# Patient Record
Sex: Female | Born: 1940 | Race: White | Hispanic: No | Smoking: Former smoker
Health system: Southern US, Community
[De-identification: ages and names within clinical notes are randomized; demographics above are authoritative.]

## PROBLEM LIST (undated history)

## (undated) DIAGNOSIS — F419 Anxiety disorder, unspecified: Secondary | ICD-10-CM

## (undated) DIAGNOSIS — K52832 Lymphocytic colitis: Secondary | ICD-10-CM

## (undated) DIAGNOSIS — F329 Major depressive disorder, single episode, unspecified: Secondary | ICD-10-CM

## (undated) DIAGNOSIS — F1010 Alcohol abuse, uncomplicated: Secondary | ICD-10-CM

## (undated) DIAGNOSIS — F32A Depression, unspecified: Secondary | ICD-10-CM

## (undated) DIAGNOSIS — E785 Hyperlipidemia, unspecified: Secondary | ICD-10-CM

## (undated) DIAGNOSIS — B54 Unspecified malaria: Secondary | ICD-10-CM

## (undated) DIAGNOSIS — R413 Other amnesia: Secondary | ICD-10-CM

## (undated) HISTORY — DX: Major depressive disorder, single episode, unspecified: F32.9

## (undated) HISTORY — DX: Lymphocytic colitis: K52.832

## (undated) HISTORY — DX: Depression, unspecified: F32.A

## (undated) HISTORY — DX: Hyperlipidemia, unspecified: E78.5

## (undated) HISTORY — DX: Unspecified malaria: B54

## (undated) HISTORY — DX: Other amnesia: R41.3

## (undated) HISTORY — DX: Alcohol abuse, uncomplicated: F10.10

## (undated) HISTORY — DX: Anxiety disorder, unspecified: F41.9

---

## 1997-12-27 HISTORY — PX: CARDIAC VALVE SURGERY: SHX40

## 2014-12-27 HISTORY — PX: CARDIAC VALVE SURGERY: SHX40

## 2017-03-18 DIAGNOSIS — E7800 Pure hypercholesterolemia, unspecified: Secondary | ICD-10-CM | POA: Diagnosis not present

## 2017-03-18 DIAGNOSIS — J209 Acute bronchitis, unspecified: Secondary | ICD-10-CM | POA: Diagnosis not present

## 2017-05-03 DIAGNOSIS — H1132 Conjunctival hemorrhage, left eye: Secondary | ICD-10-CM | POA: Diagnosis not present

## 2017-05-04 DIAGNOSIS — Z952 Presence of prosthetic heart valve: Secondary | ICD-10-CM | POA: Diagnosis not present

## 2017-05-04 DIAGNOSIS — E7800 Pure hypercholesterolemia, unspecified: Secondary | ICD-10-CM | POA: Diagnosis not present

## 2017-05-04 DIAGNOSIS — I10 Essential (primary) hypertension: Secondary | ICD-10-CM | POA: Diagnosis not present

## 2017-08-01 DIAGNOSIS — M5136 Other intervertebral disc degeneration, lumbar region: Secondary | ICD-10-CM | POA: Diagnosis not present

## 2017-08-01 DIAGNOSIS — M1611 Unilateral primary osteoarthritis, right hip: Secondary | ICD-10-CM | POA: Diagnosis not present

## 2017-08-24 DIAGNOSIS — E7800 Pure hypercholesterolemia, unspecified: Secondary | ICD-10-CM | POA: Diagnosis not present

## 2017-08-24 DIAGNOSIS — Z954 Presence of other heart-valve replacement: Secondary | ICD-10-CM | POA: Diagnosis not present

## 2017-08-24 DIAGNOSIS — Z87442 Personal history of urinary calculi: Secondary | ICD-10-CM | POA: Diagnosis not present

## 2017-08-24 DIAGNOSIS — G629 Polyneuropathy, unspecified: Secondary | ICD-10-CM | POA: Diagnosis not present

## 2017-08-24 DIAGNOSIS — Q253 Supravalvular aortic stenosis: Secondary | ICD-10-CM | POA: Diagnosis not present

## 2017-08-24 DIAGNOSIS — I350 Nonrheumatic aortic (valve) stenosis: Secondary | ICD-10-CM | POA: Diagnosis not present

## 2017-08-24 DIAGNOSIS — Z953 Presence of xenogenic heart valve: Secondary | ICD-10-CM | POA: Diagnosis not present

## 2017-10-10 DIAGNOSIS — M5431 Sciatica, right side: Secondary | ICD-10-CM | POA: Diagnosis not present

## 2017-10-10 DIAGNOSIS — M1611 Unilateral primary osteoarthritis, right hip: Secondary | ICD-10-CM | POA: Diagnosis not present

## 2017-10-11 DIAGNOSIS — Z1231 Encounter for screening mammogram for malignant neoplasm of breast: Secondary | ICD-10-CM | POA: Diagnosis not present

## 2017-11-07 DIAGNOSIS — I10 Essential (primary) hypertension: Secondary | ICD-10-CM | POA: Diagnosis not present

## 2017-11-07 DIAGNOSIS — I350 Nonrheumatic aortic (valve) stenosis: Secondary | ICD-10-CM | POA: Diagnosis not present

## 2017-11-09 DIAGNOSIS — E782 Mixed hyperlipidemia: Secondary | ICD-10-CM | POA: Diagnosis not present

## 2017-11-09 DIAGNOSIS — I10 Essential (primary) hypertension: Secondary | ICD-10-CM | POA: Diagnosis not present

## 2017-11-09 DIAGNOSIS — E7800 Pure hypercholesterolemia, unspecified: Secondary | ICD-10-CM | POA: Diagnosis not present

## 2017-11-09 DIAGNOSIS — Z952 Presence of prosthetic heart valve: Secondary | ICD-10-CM | POA: Diagnosis not present

## 2017-11-09 DIAGNOSIS — M549 Dorsalgia, unspecified: Secondary | ICD-10-CM | POA: Diagnosis not present

## 2017-11-21 DIAGNOSIS — M5431 Sciatica, right side: Secondary | ICD-10-CM | POA: Diagnosis not present

## 2017-11-21 DIAGNOSIS — M1611 Unilateral primary osteoarthritis, right hip: Secondary | ICD-10-CM | POA: Diagnosis not present

## 2017-11-22 DIAGNOSIS — L821 Other seborrheic keratosis: Secondary | ICD-10-CM | POA: Diagnosis not present

## 2017-11-22 DIAGNOSIS — Z08 Encounter for follow-up examination after completed treatment for malignant neoplasm: Secondary | ICD-10-CM | POA: Diagnosis not present

## 2017-11-22 DIAGNOSIS — L570 Actinic keratosis: Secondary | ICD-10-CM | POA: Diagnosis not present

## 2017-11-22 DIAGNOSIS — X32XXXA Exposure to sunlight, initial encounter: Secondary | ICD-10-CM | POA: Diagnosis not present

## 2017-11-22 DIAGNOSIS — D225 Melanocytic nevi of trunk: Secondary | ICD-10-CM | POA: Diagnosis not present

## 2017-11-22 DIAGNOSIS — D2261 Melanocytic nevi of right upper limb, including shoulder: Secondary | ICD-10-CM | POA: Diagnosis not present

## 2017-11-22 DIAGNOSIS — D2272 Melanocytic nevi of left lower limb, including hip: Secondary | ICD-10-CM | POA: Diagnosis not present

## 2017-11-22 DIAGNOSIS — Z85828 Personal history of other malignant neoplasm of skin: Secondary | ICD-10-CM | POA: Diagnosis not present

## 2017-11-22 DIAGNOSIS — D2262 Melanocytic nevi of left upper limb, including shoulder: Secondary | ICD-10-CM | POA: Diagnosis not present

## 2017-11-22 DIAGNOSIS — D2271 Melanocytic nevi of right lower limb, including hip: Secondary | ICD-10-CM | POA: Diagnosis not present

## 2017-11-28 DIAGNOSIS — W57XXXA Bitten or stung by nonvenomous insect and other nonvenomous arthropods, initial encounter: Secondary | ICD-10-CM | POA: Diagnosis not present

## 2017-11-28 DIAGNOSIS — R21 Rash and other nonspecific skin eruption: Secondary | ICD-10-CM | POA: Diagnosis not present

## 2017-12-01 DIAGNOSIS — S70361A Insect bite (nonvenomous), right thigh, initial encounter: Secondary | ICD-10-CM | POA: Diagnosis not present

## 2017-12-01 DIAGNOSIS — S40862A Insect bite (nonvenomous) of left upper arm, initial encounter: Secondary | ICD-10-CM | POA: Diagnosis not present

## 2018-02-28 DIAGNOSIS — Z23 Encounter for immunization: Secondary | ICD-10-CM | POA: Diagnosis not present

## 2018-04-26 DIAGNOSIS — H18831 Recurrent erosion of cornea, right eye: Secondary | ICD-10-CM | POA: Diagnosis not present

## 2018-05-16 DIAGNOSIS — Z952 Presence of prosthetic heart valve: Secondary | ICD-10-CM | POA: Diagnosis not present

## 2018-05-16 DIAGNOSIS — G459 Transient cerebral ischemic attack, unspecified: Secondary | ICD-10-CM | POA: Diagnosis not present

## 2018-05-16 DIAGNOSIS — E782 Mixed hyperlipidemia: Secondary | ICD-10-CM | POA: Diagnosis not present

## 2018-05-16 DIAGNOSIS — I10 Essential (primary) hypertension: Secondary | ICD-10-CM | POA: Diagnosis not present

## 2018-06-28 DIAGNOSIS — L570 Actinic keratosis: Secondary | ICD-10-CM | POA: Diagnosis not present

## 2018-06-28 DIAGNOSIS — L821 Other seborrheic keratosis: Secondary | ICD-10-CM | POA: Diagnosis not present

## 2018-06-28 DIAGNOSIS — L814 Other melanin hyperpigmentation: Secondary | ICD-10-CM | POA: Diagnosis not present

## 2018-06-28 DIAGNOSIS — D229 Melanocytic nevi, unspecified: Secondary | ICD-10-CM | POA: Diagnosis not present

## 2018-06-28 DIAGNOSIS — X32XXXA Exposure to sunlight, initial encounter: Secondary | ICD-10-CM | POA: Diagnosis not present

## 2018-07-03 DIAGNOSIS — I340 Nonrheumatic mitral (valve) insufficiency: Secondary | ICD-10-CM | POA: Diagnosis not present

## 2018-07-03 DIAGNOSIS — I348 Other nonrheumatic mitral valve disorders: Secondary | ICD-10-CM | POA: Diagnosis not present

## 2018-07-03 DIAGNOSIS — I350 Nonrheumatic aortic (valve) stenosis: Secondary | ICD-10-CM | POA: Diagnosis not present

## 2018-07-03 DIAGNOSIS — I361 Nonrheumatic tricuspid (valve) insufficiency: Secondary | ICD-10-CM | POA: Diagnosis not present

## 2018-07-11 DIAGNOSIS — R410 Disorientation, unspecified: Secondary | ICD-10-CM | POA: Diagnosis not present

## 2018-07-11 DIAGNOSIS — F419 Anxiety disorder, unspecified: Secondary | ICD-10-CM | POA: Diagnosis not present

## 2018-07-11 DIAGNOSIS — R001 Bradycardia, unspecified: Secondary | ICD-10-CM | POA: Diagnosis not present

## 2018-08-09 DIAGNOSIS — Z1211 Encounter for screening for malignant neoplasm of colon: Secondary | ICD-10-CM | POA: Diagnosis not present

## 2018-08-09 DIAGNOSIS — Z1212 Encounter for screening for malignant neoplasm of rectum: Secondary | ICD-10-CM | POA: Diagnosis not present

## 2018-08-23 DIAGNOSIS — Z952 Presence of prosthetic heart valve: Secondary | ICD-10-CM | POA: Diagnosis not present

## 2018-08-23 DIAGNOSIS — I10 Essential (primary) hypertension: Secondary | ICD-10-CM | POA: Diagnosis not present

## 2018-08-23 DIAGNOSIS — E782 Mixed hyperlipidemia: Secondary | ICD-10-CM | POA: Diagnosis not present

## 2018-09-14 DIAGNOSIS — H26493 Other secondary cataract, bilateral: Secondary | ICD-10-CM | POA: Diagnosis not present

## 2018-09-14 DIAGNOSIS — H35371 Puckering of macula, right eye: Secondary | ICD-10-CM | POA: Diagnosis not present

## 2018-10-10 DIAGNOSIS — Z23 Encounter for immunization: Secondary | ICD-10-CM | POA: Diagnosis not present

## 2018-12-18 ENCOUNTER — Other Ambulatory Visit: Payer: Self-pay

## 2018-12-18 ENCOUNTER — Ambulatory Visit (INDEPENDENT_AMBULATORY_CARE_PROVIDER_SITE_OTHER): Payer: Medicare Other | Admitting: Family Medicine

## 2018-12-18 ENCOUNTER — Encounter: Payer: Self-pay | Admitting: Family Medicine

## 2018-12-18 VITALS — BP 134/82 | HR 74 | Temp 97.7°F | Resp 18 | Ht 64.17 in | Wt 129.2 lb

## 2018-12-18 DIAGNOSIS — F329 Major depressive disorder, single episode, unspecified: Secondary | ICD-10-CM

## 2018-12-18 DIAGNOSIS — R233 Spontaneous ecchymoses: Secondary | ICD-10-CM

## 2018-12-18 DIAGNOSIS — F411 Generalized anxiety disorder: Secondary | ICD-10-CM

## 2018-12-18 LAB — POCT CBC
GRANULOCYTE PERCENT: 83.3 % — AB (ref 37–80)
HCT, POC: 37.9 % (ref 29–41)
Hemoglobin: 12.8 g/dL (ref 11–14.6)
Lymph, poc: 1.2 (ref 0.6–3.4)
MCH, POC: 30.4 pg (ref 27–31.2)
MCHC: 33.9 g/dL (ref 31.8–35.4)
MCV: 89.4 fL (ref 76–111)
MID (cbc): 0.5 (ref 0–0.9)
MPV: 7.6 fL (ref 0–99.8)
POC Granulocyte: 8.2 — AB (ref 2–6.9)
POC LYMPH PERCENT: 11.9 %L (ref 10–50)
POC MID %: 4.8 % (ref 0–12)
Platelet Count, POC: 180 10*3/uL (ref 142–424)
RBC: 4.23 M/uL (ref 4.04–5.48)
RDW, POC: 14.7 %
WBC: 9.8 10*3/uL (ref 4.6–10.2)

## 2018-12-18 MED ORDER — SERTRALINE HCL 25 MG PO TABS: 25.0000 mg | ORAL_TABLET | Freq: Every day | ORAL | 2 refills | Status: DC

## 2018-12-18 NOTE — Progress Notes (Signed)
Patient ID: Connie Alvarado, female    DOB: [birth year]  Age: 77 y.o. MRN: [id number]  Chief Complaint  Patient presents with  . Rash    X 2 mth- left leg  . Anxiety    screening done  . Depression    screening done    Subjective:   77 year old lady who lives in [place] who is here at her daughter's for an extended visit.  She came a couple months ago and plans to be here couple of more months.  Her husband is hospitalized with kidney cancer problems.  She has been under good deal of stress with that.  She has a rash on her left ankle that she is concerned about.  She had shown it to a doctor in the hospital who said she should come see a doctor.  She has a cat, but does not think she was ever scratched.  She does have some anxiety issues with her husband being ill which is gradually gotten worse.  She also has been depressed.  Denies suicidal ideation.  Has not been on any antidepressant medication, but she has some Ativan which she takes on a as needed basis from her doctor in [place].  Current allergies, medications, problem list, past/family and social histories reviewed.  She has a history of having had aortic valve replaced in 1999 and again in 2016.  She does not smoke.  She drinks a 2 or 3 glasses of wine every day.  She has Lipitor, aspirin, and Ativan listed as a medicines.  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  Objective:  BP 134/82   Pulse 74   Temp 97.7 F (36.5 C) (Oral)   Resp 18   Ht 5' 4.17" (1.63 m)   Wt 129 lb 3.2 oz (58.6 kg)   SpO2 98%   BMI 22.06 kg/m   Pleasant lady, alert.  Mildly confused seeming at times, does not remember some stuff that is just been sad but she is quite anxious.  Her daughter accompanying her understands things.  On the ankle there is an area of 3 linear rashes which are parallel about a centimeter apart.  There are little punctate erythematous areas along each linear  streak and some larger ecchymotic areas up to 3 or 4 mm in diameter.  Looks almost petechial.  None described elsewhere.  Assessment & Plan:   Assessment: 1. Petechial rash   2. Reactive depression   3. Generalized anxiety disorder       Plan: Check a CBC.  She has had a flu shot but urged her to get a Pneumovax sometime.  She needs to check with the doctor in [place] to find out what she has had.  We will start her on an SSRI.  Orders Placed This Encounter  Procedures  . POCT CBC    No orders of the defined types were placed in this encounter.   Results for orders placed or performed in visit on 12/18/18  POCT CBC  Result Value Ref Range   WBC 9.8 4.6 - 10.2 K/uL   Lymph, poc 1.2 0.6 - 3.4   POC LYMPH PERCENT 11.9 10 - 50 %L   MID (cbc) 0.5 0 - 0.9   POC MID % 4.8 0 - 12 %M   POC Granulocyte 8.2 (A) 2 - 6.9   Granulocyte percent 83.3 (A) 37 - 80 %G   RBC 4.23 4.04 - 5.48  M/uL   Hemoglobin 12.8 11 - 14.6 g/dL   HCT, POC 37.9 29 - 41 %   MCV 89.4 76 - 111 fL   MCH, POC 30.4 27 - 31.2 pg   MCHC 33.9 31.8 - 35.4 g/dL   RDW, POC 14.7 %   Platelet Count, POC 180 142 - 424 K/uL   MPV 7.6 0 - 99.8 fL        Patient Instructions  Check to see which pneumonia vaccination you have had, Pneumovax 23 or Prevnar.  You ultimately need to have both.  If you have not had the Pneumovax 23 for over 10 years you should get it repeated.  Minimize use of the Ativan.  (Lorazepam)  Try to decrease the regular wine intake.  Take pictures to monitor the skin lesions as noted.  I do not believe these are of any great concern and I expect them to resolve eventually on their own.  If they are getting worse please return, or if no change in about 3 months get them rechecked.  Begin Zoloft (sertraline) 25 mg 1 daily each morning.  This is good for depression and anxiety both, but takes being on it for about 2 to 3 weeks before you note whether it is helping.  If you are not  improving over the next month get rechecked as your dose may need to be increased.  Return as needed, or establish yourself or primary care here if staying in the area longer.        No follow-ups on file.   Ruben Reason, MD 12/18/2018

## 2018-12-18 NOTE — Patient Instructions (Addendum)
Check to see which pneumonia vaccination you have had, Pneumovax 23 or Prevnar.  You ultimately need to have both.  If you have not had the Pneumovax 23 for over 10 years you should get it repeated.  Minimize use of the Ativan.  (Lorazepam)  Try to decrease the regular wine intake.  Take pictures to monitor the skin lesions as noted.  I do not believe these are of any great concern and I expect them to resolve eventually on their own.  If they are getting worse please return, or if no change in about 3 months get them rechecked.  Begin Zoloft (sertraline) 25 mg 1 daily each morning.  This is good for depression and anxiety both, but takes being on it for about 2 to 3 weeks before you note whether it is helping.  If you are not improving over the next month get rechecked as your dose may need to be increased.  Return as needed, or establish yourself or primary care here if staying in the area longer.

## 2019-03-07 ENCOUNTER — Emergency Department (HOSPITAL_COMMUNITY): Payer: Medicare Other

## 2019-03-07 ENCOUNTER — Encounter (HOSPITAL_COMMUNITY): Payer: Self-pay | Admitting: Emergency Medicine

## 2019-03-07 ENCOUNTER — Emergency Department (HOSPITAL_COMMUNITY)
Admission: EM | Admit: 2019-03-07 | Discharge: 2019-03-07 | Disposition: A | Discharge status: Home / Self Care | Payer: Medicare Other | Attending: Emergency Medicine | Admitting: Emergency Medicine

## 2019-03-07 ENCOUNTER — Other Ambulatory Visit: Payer: Self-pay

## 2019-03-07 DIAGNOSIS — Y998 Other external cause status: Secondary | ICD-10-CM | POA: Diagnosis not present

## 2019-03-07 DIAGNOSIS — S299XXA Unspecified injury of thorax, initial encounter: Secondary | ICD-10-CM | POA: Diagnosis not present

## 2019-03-07 DIAGNOSIS — Z79899 Other long term (current) drug therapy: Secondary | ICD-10-CM | POA: Diagnosis not present

## 2019-03-07 DIAGNOSIS — S42292A Other displaced fracture of upper end of left humerus, initial encounter for closed fracture: Secondary | ICD-10-CM | POA: Diagnosis not present

## 2019-03-07 DIAGNOSIS — S3993XA Unspecified injury of pelvis, initial encounter: Secondary | ICD-10-CM | POA: Diagnosis not present

## 2019-03-07 DIAGNOSIS — W19XXXA Unspecified fall, initial encounter: Secondary | ICD-10-CM | POA: Diagnosis not present

## 2019-03-07 DIAGNOSIS — Z87891 Personal history of nicotine dependence: Secondary | ICD-10-CM | POA: Insufficient documentation

## 2019-03-07 DIAGNOSIS — S42212A Unspecified displaced fracture of surgical neck of left humerus, initial encounter for closed fracture: Secondary | ICD-10-CM

## 2019-03-07 DIAGNOSIS — S4992XA Unspecified injury of left shoulder and upper arm, initial encounter: Secondary | ICD-10-CM | POA: Diagnosis present

## 2019-03-07 DIAGNOSIS — Y929 Unspecified place or not applicable: Secondary | ICD-10-CM | POA: Insufficient documentation

## 2019-03-07 DIAGNOSIS — F10920 Alcohol use, unspecified with intoxication, uncomplicated: Secondary | ICD-10-CM | POA: Diagnosis not present

## 2019-03-07 DIAGNOSIS — Y9389 Activity, other specified: Secondary | ICD-10-CM | POA: Diagnosis not present

## 2019-03-07 LAB — ETHANOL: Alcohol, Ethyl (B): 175 mg/dL — ABNORMAL HIGH (ref <10)

## 2019-03-07 MED ORDER — HYDROCODONE-ACETAMINOPHEN 5-325 MG PO TABS: 1.0000 | ORAL_TABLET | ORAL | 0 refills | Status: DC | PRN

## 2019-03-07 MED ORDER — HYDROCODONE-ACETAMINOPHEN 5-325 MG PO TABS
1.0000 | ORAL_TABLET | Freq: Once | ORAL | Status: AC
  Administered 2019-03-07: 1 via ORAL
  Filled 2019-03-07: qty 1

## 2019-03-07 NOTE — Progress Notes (Signed)
NEW PATIENT  Assessment and Plan:  Medication management -     CBC with Differential/Platelet -     COMPLETE METABOLIC PANEL WITH GFR -     Magnesium  Alcohol abuse with alcohol-induced disorder (HCC) -     CT Head Wo Contrast; Future - ATIVAN GIVEN TO DAUGHTER, no history of DT's, can given 1/2 ativan or 0.25mg   2-3 x a day AS needed to patient for tremors, anxiety - cautions with ativan use given and discussion of alcohol withdrawal and DTS with daughter and paitent -With recent fall will get CT screening to rule out hemorrhage, especially with alcohol history  Double vision -     CT Head Wo Contrast; Future Follow up eye doctor, last appointment 1 year ago in [place] With recent fall will get CT screening to rule out hemorrhage, may need MRI/MRA  Fall, initial encounter -     CT Head Wo Contrast; Future -     Ambulatory referral to Orthopedics - in a sling, needs follow up at ortho, will send that referral in and daughter encouraged to call. The patient and daughter wish to do tylenol or ibuprofen for pain control rather than a narcotic. Due to her neurological complains and alcohol which thins the blood, I will obtain a CT head to rule out a hemorrhage prior to NSAID use. - patient will need PT after healing - given RX for toilet riser with handles  - has shower chair, declines cane at this time- will evaluate at PT in the future hopefully  Mixed hyperlipidemia check lipids decrease fatty foods increase activity.  -     TSH -     Lipid panel  H/O prosthetic aortic valve replacement -     Ambulatory referral to Cardiology - suggest echo at this time  B12 deficiency -     Vitamin B12 -     Vitamin B1 - with long term alcohol use and neuro complaints will get labs to rule out defciency  Anemia, unspecified type -     Iron,Total/Total Iron Binding Cap -     Folate RBC  Vitamin D deficiency -     VITAMIN D 25 Hydroxy (Vit-D Deficiency, Fractures)  Urinary  frequency -     Urinalysis, Routine w reflex microscopic -     Urine Culture - rule out infection  Grief -     sertraline (ZOLOFT) 50 MG tablet; Take 1 tablet (50 mg total) by mouth daily. - suggest hospice/counseling.    CLOSE FOLLOW UP 1-2 WEEKS Discussed med's effects and SE's. Screening labs and tests as requested with regular follow-up as recommended. Over 40 minutes of exam, counseling, chart review, and complex, high level critical decision making was performed this visit.   HPI  78 y.o. female  presents for evaluation as new patient and follow up for has Alcohol abuse with alcohol-induced disorder ([place]); Medication management; Mixed hyperlipidemia; and H/O prosthetic aortic valve replacement on their problem list..  Her blood pressure has been controlled at home, today their BP is BP: 124/80 She does not workout. She denies chest pain, shortness of breath, dizziness.   She has history of anxiety, her husband has kidney cancer, passed very recently. Moved in with her daughter from [place].   She was given zoloft in Dec from Dr. Linna Darner, she drinks wine nightly, she is on ativan from [place] doctor.   She had a fall yesterday, daughter states she did not eat, she was drinking vodka and taking  ativan, had unwitnessed fall, uncertain if she passed out, she does not remember fall. She did not lose bladder control, no known CP, SOB, dizziness prior to fall. Daughter heard her calling, she was unable to get up. Went to ER, had humerus fracture, in a sling, needs follow up at ortho, will send that referral in and daughter encouraged to call.  Alcohol was 175 at that time.   She also had a fall march 6th trying to get up from a toliet.  Daughter states her mom has had double vision, her memory has been worse. She has a history of daily wine use for a number of years. Has not had seizures or DTs in the past. Has not had a drink x yesterday. She has not had her ativan as well and her daughter  did not pick up the vicodin RX due to fear of mixing the two. The patient and daughter wish to do tylenol or ibuprofen for pain control rather than a narcotic. Due to her neurological complains and alcohol which thins the blood, I will obtain a CT head to rule out a hemorrhage prior to NSAID use. I will also obtain labs to look for deficiency, she has not been eating or drinking well due to alcohol consumption and recent husbands passing with many life changes such as moving here. Suggest hospice counseling, number will be referred. Patient complains of general weakness, no CP, SOB, diarrhea, constipation, or focal weakness.   She has a history of hyperlipidemia on liptior and has history of bovine aortic valve replacement x 2, last one was 2016, she is due for an echo and needs referral to a local cardiologist for follow up. In light of her recent fall and syncope, likely substance abuse, will refer but will likely need echo sooner rather than later to rule out other causes.    Current Medications:  Current Outpatient Medications on File Prior to Visit  Medication Sig Dispense Refill  . atorvastatin (LIPITOR) 80 MG tablet Take 80 mg by mouth daily.    Marland Kitchen HYDROcodone-acetaminophen (NORCO/VICODIN) 5-325 MG tablet Take 1 tablet by mouth every 4 (four) hours as needed. 10 tablet 0  . LORazepam (ATIVAN) 0.5 MG tablet Take 0.5-1 mg by mouth See admin instructions. Every 4 to 6 hours as needed for anxiety. May repeat in 30 minutes if needed.    Marland Kitchen VITAMIN D PO Take 1 tablet by mouth daily.     No current facility-administered medications on file prior to visit.    Allergies:  No Known Allergies Medical History:  She has Alcohol abuse with alcohol-induced disorder ([place]); Medication management; Mixed hyperlipidemia; and H/O prosthetic aortic valve replacement on their problem list.  Will request complete records from previous doctors  Patient Care Team: Unk Pinto, MD as PCP - General (Internal  Medicine)  Surgical History:  She has a past surgical history that includes Cardiac valuve replacement (1999) and Cardiac valuve replacement (2016). Family History:  Herfamily history includes Cancer in her mother; Hyperlipidemia in her mother; Hypertension in her mother; Kidney disease in her father; Parkinson's disease in her father. Social History:  She reports that she quit smoking about 40 years ago. Her smoking use included cigarettes. She has never used smokeless tobacco. She reports current alcohol use of about 19.0 standard drinks of alcohol per week. She reports that she does not use drugs.  Review of Systems: ROS See HPI  Physical Exam: Estimated body mass index is 21.18 kg/m as calculated from the  following:   Height as of this encounter: 5\' 4"  (1.626 m).   Weight as of this encounter: 123 lb 6.4 oz (56 kg). BP 124/80   Pulse 81   Resp 14   Ht 5\' 4"  (1.626 m)   Wt 123 lb 6.4 oz (56 kg)   SpO2 96%   BMI 21.18 kg/m  General Appearance: elderly frail appearing female in no apparent distress but some level of discomfort from her right arm.  Eyes: PERRLA, EOMs, conjunctiva no swelling or erythema Sinuses: No Frontal/maxillary tenderness, yellow healing ecchymosis along frontal scalp at hair line, nontender.   ENT/Mouth: Ext aud canals clear, normal light reflex with TMs without erythema, bulging.  No erythema, swelling, or exudate on post pharynx. Tonsils not swollen or erythematous. Hearing good Neck: Supple, thyroid normal. No bruits  Respiratory: Respiratory effort normal, BS equal bilaterally without rales, rhonchi, wheezing or stridor.  Cardio: RRR without murmurs, rubs or gallops.  Chest: symmetric, with normal excursions and percussion.  Abdomen: Soft, nontender, no guarding, rebound, hernias, masses, or organomegaly.  Lymphatics: Non tender without lymphadenopathy.  Musculoskeletal: Full ROM all peripheral extremities BUT left arm, left arm in a sling with pain with  any movement, ecchymosis and swelling at right shoulder, no swelling distal right hand, good sensation and pulses right hand, pain in right arm with right wrist extension, good grip strength with some pain, antalgic, unsteady gait.  Skin: Warm, dry without rashes, lesions, ecchymosis. Neuro: Cranial nerves intact, some nystagmus horizontal,  Normal muscle tone, wide based stance,  Sensation intact.  Psych: Awake and oriented X 3, normal affect, Insight and Judgment appropriate, depressed mood.    Vicie Mutters 11:49 AM [facility]

## 2019-03-07 NOTE — ED Provider Notes (Signed)
[facility] DEPT Provider Note   CSN: [id number] Arrival date & time: 03/07/19  2152    History   Chief Complaint Chief Complaint  Patient presents with  . Fall  . Arm Injury  . Alcohol Intoxication    HPI Connie Alvarado is a 78 y.o. female.     Pt presents to the ED today with left arm pain.  Pt has been drinking tonight and fell, hitting her left arm.  She did fall and hit her head a few days ago, but not tonight.  She does not take blood thinners.  She does drink heavily, and has been drinking more since her husband passed away recently.     Past Medical History:  Diagnosis Date  . Anxiety   . Depression     There are no active problems to display for this patient.   Past Surgical History:  Procedure Laterality Date  . [place]  . CARDIAC VALVE SURGERY  2016     OB History   No obstetric history on file.      Home Medications    Prior to Admission medications   Medication Sig Start Date End Date Taking? Authorizing Provider  atorvastatin (LIPITOR) 80 MG tablet Take 80 mg by mouth daily.   Yes [provider]  LORazepam (ATIVAN) 0.5 MG tablet Take 0.5-1 mg by mouth See admin instructions. Every 4 to 6 hours as needed for anxiety. May repeat in 30 minutes if needed.   Yes [provider]  sertraline (ZOLOFT) 25 MG tablet Take 1 tablet (25 mg total) by mouth daily. 12/18/18  Yes Posey Boyer, MD  VITAMIN D PO Take 1 tablet by mouth daily.   Yes [provider]  HYDROcodone-acetaminophen (NORCO/VICODIN) 5-325 MG tablet Take 1 tablet by mouth every 4 (four) hours as needed. 03/07/19   Isla Pence, MD    Family History Family History  Problem Relation Age of Onset  . Cancer Mother   . Hypertension Mother   . Hyperlipidemia Mother   . Parkinson's disease Father   . Kidney disease Father     Social History Social History   Tobacco Use  . Smoking status: Former Smoker   Types: Cigarettes    Last attempt to quit: 1980    Years since quitting: 40.2  . Smokeless tobacco: Never Used  Substance Use Topics  . Alcohol use: Yes    Alcohol/week: 19.0 standard drinks    Types: 19 Glasses of wine per week  . Drug use: Never     Allergies   Patient has no known allergies.   Review of Systems Review of Systems  Musculoskeletal:       Left arm pain  All other systems reviewed and are negative.    Physical Exam Updated Vital Signs BP 130/74 (BP Location: Right Arm)   Pulse 71   Temp 98.4 F (36.9 C) (Oral)   Resp (!) 22   SpO2 97%   Physical Exam Vitals signs and nursing note reviewed.  Constitutional:      Appearance: Normal appearance.  HENT:     Head: Normocephalic and atraumatic.     Right Ear: External ear normal.     Left Ear: External ear normal.     Nose: Nose normal.     Mouth/Throat:     Mouth: Mucous membranes are moist.  Eyes:     Extraocular Movements: Extraocular movements intact.     Pupils: Pupils are equal,  round, and reactive to light.  Neck:     Musculoskeletal: Normal range of motion and neck supple.  Cardiovascular:     Rate and Rhythm: Normal rate and regular rhythm.     Pulses: Normal pulses.     Heart sounds: Normal heart sounds.  Pulmonary:     Effort: Pulmonary effort is normal.     Breath sounds: Normal breath sounds.  Abdominal:     General: Abdomen is flat.     Palpations: Abdomen is soft.  Musculoskeletal:     Left shoulder: She exhibits decreased range of motion and tenderness.  Skin:    General: Skin is warm.     Capillary Refill: Capillary refill takes less than 2 seconds.  Neurological:     General: No focal deficit present.     Mental Status: She is alert and oriented to person, place, and time.  Psychiatric:        Mood and Affect: Mood normal.        Behavior: Behavior normal.      ED Treatments / Results  Labs (all labs ordered are listed, but only abnormal results are displayed)  Labs Reviewed  ETHANOL    EKG None  Radiology Dg Chest 2 View  Result Date: 03/07/2019 CLINICAL DATA:  Fall EXAM: CHEST - 2 VIEW COMPARISON:  None. FINDINGS: The heart size and mediastinal contours are within normal limits. Both lungs are clear. The visualized skeletal structures are unremarkable. Remote median sternotomy and valvuloplasty. IMPRESSION: No active cardiopulmonary disease. Electronically Signed   By: Ulyses Jarred M.D.   On: 03/07/2019 22:45   Dg Pelvis 1-2 Views  Result Date: 03/07/2019 CLINICAL DATA:  Fall EXAM: PELVIS - 1-2 VIEW COMPARISON:  None. FINDINGS: There is no evidence of pelvic fracture or diastasis. Mild bilateral hip osteoarthrosis, right worse than left. No pelvic bone lesions are seen. IMPRESSION: Negative. Electronically Signed   By: Ulyses Jarred M.D.   On: 03/07/2019 22:44   Dg Shoulder Left  Result Date: 03/07/2019 CLINICAL DATA:  Fall EXAM: LEFT SHOULDER - 2+ VIEW COMPARISON:  None. FINDINGS: Comminuted fracture of the proximal left humerus with minimal displacement of any fracture fragment. There is no glenohumeral dislocation. Left clavicle is intact. IMPRESSION: Comminuted fracture of the proximal left humerus. Electronically Signed   By: Ulyses Jarred M.D.   On: 03/07/2019 22:43   Dg Humerus Left  Result Date: 03/07/2019 CLINICAL DATA:  Fall EXAM: LEFT HUMERUS - 2+ VIEW COMPARISON:  None. FINDINGS: Comminuted fracture of the left humeral neck. No distal humeral fracture. Minimal displacement of any fracture fragment., IMPRESSION: Comminuted left humeral neck fracture. Electronically Signed   By: Ulyses Jarred M.D.   On: 03/07/2019 22:43    Procedures Procedures (including critical care time)  Medications Ordered in ED Medications  HYDROcodone-acetaminophen (NORCO/VICODIN) 5-325 MG per tablet 1 tablet (has no administration in time range)     Initial Impression / Assessment and Plan / ED Course  I have reviewed the triage vital signs and the  nursing notes.  Pertinent labs & imaging results that were available during my care of the patient were reviewed by me and considered in my medical decision making (see chart for details).       Pt's daughter request that we get a blood alcohol level for her doctor.  We will draw that prior to d/c.  Pt placed in a shoulder immobilizer.  She is instructed to f/u with Dr. Tamera Punt (ortho).  Return if worse.  Final Clinical Impressions(s) / ED Diagnoses   Final diagnoses:  Fall, initial encounter  Closed displaced fracture of surgical neck of left humerus, unspecified fracture morphology, initial encounter    ED Discharge Orders         Ordered    HYDROcodone-acetaminophen (NORCO/VICODIN) 5-325 MG tablet  Every 4 hours PRN     03/07/19 2255           Isla Pence, MD 03/07/19 2255

## 2019-03-07 NOTE — ED Triage Notes (Signed)
Patient arrives via EMS with Daughter from home. Patient and daughter were drinking together. Per patient, she had 2 glasses of wine. Per daughter, patient has also had an unknown amount of ativan. Patient fell at home, increased falls over the last few days, unknown if from increased alcohol consumption. Patient complaining of left arm and shoulder pain.

## 2019-03-07 NOTE — ED Notes (Signed)
Bed: WA21 Expected date:  Expected time:  Means of arrival:  Comments: EMS 78 yo female from home-fall-left arm pain/intoxicated-bruising

## 2019-03-08 ENCOUNTER — Telehealth: Payer: Self-pay | Admitting: Cardiology

## 2019-03-08 ENCOUNTER — Ambulatory Visit
Admission: RE | Admit: 2019-03-08 | Discharge: 2019-03-08 | Disposition: A | Discharge status: Home / Self Care | Payer: Medicare Other | Source: Ambulatory Visit | Attending: Physician Assistant | Admitting: Physician Assistant

## 2019-03-08 ENCOUNTER — Other Ambulatory Visit: Payer: Self-pay | Admitting: Physician Assistant

## 2019-03-08 ENCOUNTER — Ambulatory Visit (INDEPENDENT_AMBULATORY_CARE_PROVIDER_SITE_OTHER): Payer: Medicare Other | Admitting: Physician Assistant

## 2019-03-08 ENCOUNTER — Encounter: Payer: Self-pay | Admitting: Physician Assistant

## 2019-03-08 VITALS — BP 124/80 | HR 81 | Resp 14 | Ht 64.0 in | Wt 123.4 lb

## 2019-03-08 DIAGNOSIS — F1019 Alcohol abuse with unspecified alcohol-induced disorder: Secondary | ICD-10-CM

## 2019-03-08 DIAGNOSIS — E538 Deficiency of other specified B group vitamins: Secondary | ICD-10-CM | POA: Diagnosis not present

## 2019-03-08 DIAGNOSIS — S0990XA Unspecified injury of head, initial encounter: Secondary | ICD-10-CM | POA: Diagnosis not present

## 2019-03-08 DIAGNOSIS — E559 Vitamin D deficiency, unspecified: Secondary | ICD-10-CM | POA: Diagnosis not present

## 2019-03-08 DIAGNOSIS — F4321 Adjustment disorder with depressed mood: Secondary | ICD-10-CM

## 2019-03-08 DIAGNOSIS — H532 Diplopia: Secondary | ICD-10-CM

## 2019-03-08 DIAGNOSIS — R350 Frequency of micturition: Secondary | ICD-10-CM

## 2019-03-08 DIAGNOSIS — D649 Anemia, unspecified: Secondary | ICD-10-CM | POA: Diagnosis not present

## 2019-03-08 DIAGNOSIS — E782 Mixed hyperlipidemia: Secondary | ICD-10-CM | POA: Diagnosis not present

## 2019-03-08 DIAGNOSIS — Z952 Presence of prosthetic heart valve: Secondary | ICD-10-CM | POA: Insufficient documentation

## 2019-03-08 DIAGNOSIS — W19XXXA Unspecified fall, initial encounter: Secondary | ICD-10-CM

## 2019-03-08 DIAGNOSIS — Z79899 Other long term (current) drug therapy: Secondary | ICD-10-CM | POA: Diagnosis not present

## 2019-03-08 MED ORDER — SERTRALINE HCL 50 MG PO TABS: 50.0000 mg | ORAL_TABLET | Freq: Every day | ORAL | 2 refills | Status: DC

## 2019-03-08 NOTE — Telephone Encounter (Signed)
Called patient and LVM to call back to schedule new patient appointment.

## 2019-03-09 LAB — URINALYSIS, ROUTINE W REFLEX MICROSCOPIC
Bilirubin Urine: NEGATIVE
Glucose, UA: NEGATIVE
Hgb urine dipstick: NEGATIVE
Ketones, ur: NEGATIVE
Leukocytes,Ua: NEGATIVE
Nitrite: NEGATIVE
Protein, ur: NEGATIVE
Specific Gravity, Urine: 1.016 (ref 1.001–1.03)
pH: <=5 (ref 5.0–8.0)

## 2019-03-09 LAB — URINE CULTURE
MICRO NUMBER:: 312132
RESULT: NO GROWTH
SPECIMEN QUALITY:: ADEQUATE

## 2019-03-11 ENCOUNTER — Other Ambulatory Visit: Payer: Self-pay | Admitting: Physician Assistant

## 2019-03-11 MED ORDER — TRAZODONE HCL 50 MG PO TABS: ORAL_TABLET | ORAL | 2 refills | Status: DC

## 2019-03-11 MED ORDER — LORAZEPAM 0.5 MG PO TABS: ORAL_TABLET | ORAL | 0 refills | Status: DC

## 2019-03-12 LAB — COMPLETE METABOLIC PANEL WITH GFR
AG Ratio: 1.6 (calc) (ref 1.0–2.5)
ALBUMIN MSPROF: 4.1 g/dL (ref 3.6–5.1)
ALT: 17 U/L (ref 6–29)
AST: 24 U/L (ref 10–35)
Alkaline phosphatase (APISO): 75 U/L (ref 37–153)
BUN: 15 mg/dL (ref 7–25)
CO2: 27 mmol/L (ref 20–32)
Calcium: 9 mg/dL (ref 8.6–10.4)
Chloride: 104 mmol/L (ref 98–110)
Creat: 0.71 mg/dL (ref 0.60–0.93)
GFR, Est African American: 95 mL/min/{1.73_m2} (ref >=60)
GFR, Est Non African American: 82 mL/min/{1.73_m2} (ref >=60)
GLOBULIN: 2.5 g/dL (ref 1.9–3.7)
Glucose, Bld: 103 mg/dL — ABNORMAL HIGH (ref 65–99)
Potassium: 4.2 mmol/L (ref 3.5–5.3)
Sodium: 140 mmol/L (ref 135–146)
Total Bilirubin: 1 mg/dL (ref 0.2–1.2)
Total Protein: 6.6 g/dL (ref 6.1–8.1)

## 2019-03-12 LAB — LIPID PANEL
Cholesterol: 182 mg/dL (ref <200)
HDL: 79 mg/dL (ref >=50)
LDL Cholesterol (Calc): 86 mg/dL (calc)
Non-HDL Cholesterol (Calc): 103 mg/dL (calc) (ref <130)
Total CHOL/HDL Ratio: 2.3 (calc) (ref <5.0)
Triglycerides: 82 mg/dL (ref <150)

## 2019-03-12 LAB — CBC WITH DIFFERENTIAL/PLATELET
Absolute Monocytes: 624 cells/uL (ref 200–950)
Basophils Absolute: 40 cells/uL (ref 0–200)
Basophils Relative: 0.5 %
Eosinophils Absolute: 40 cells/uL (ref 15–500)
Eosinophils Relative: 0.5 %
HCT: 36.9 % (ref 35.0–45.0)
Hemoglobin: 12 g/dL (ref 11.7–15.5)
Lymphs Abs: 832 cells/uL — ABNORMAL LOW (ref 850–3900)
MCH: 30.2 pg (ref 27.0–33.0)
MCHC: 32.5 g/dL (ref 32.0–36.0)
MCV: 92.9 fL (ref 80.0–100.0)
MPV: 12.2 fL (ref 7.5–12.5)
Monocytes Relative: 7.8 %
Neutro Abs: 6464 cells/uL (ref 1500–7800)
Neutrophils Relative %: 80.8 %
Platelets: 144 10*3/uL (ref 140–400)
RBC: 3.97 10*6/uL (ref 3.80–5.10)
RDW: 12.6 % (ref 11.0–15.0)
Total Lymphocyte: 10.4 %
WBC: 8 10*3/uL (ref 3.8–10.8)

## 2019-03-12 LAB — VITAMIN D 25 HYDROXY (VIT D DEFICIENCY, FRACTURES): VIT D 25 HYDROXY: 41 ng/mL (ref 30–100)

## 2019-03-12 LAB — IRON, TOTAL/TOTAL IRON BINDING CAP
%SAT: 29 % (calc) (ref 16–45)
Iron: 90 ug/dL (ref 45–160)
TIBC: 315 mcg/dL (calc) (ref 250–450)

## 2019-03-12 LAB — TSH: TSH: 0.83 mIU/L (ref 0.40–4.50)

## 2019-03-12 LAB — VITAMIN B1: Vitamin B1 (Thiamine): 7 nmol/L — ABNORMAL LOW (ref 8–30)

## 2019-03-12 LAB — VITAMIN B12: Vitamin B-12: 628 pg/mL (ref 200–1100)

## 2019-03-12 LAB — FOLATE RBC: RBC Folate: 1150 ng/mL RBC (ref >280)

## 2019-03-12 LAB — MAGNESIUM: Magnesium: 1.9 mg/dL (ref 1.5–2.5)

## 2019-03-16 ENCOUNTER — Ambulatory Visit: Payer: Medicare Other | Admitting: Physician Assistant

## 2019-03-20 ENCOUNTER — Other Ambulatory Visit: Payer: Self-pay | Admitting: Physician Assistant

## 2019-03-20 DIAGNOSIS — F4321 Adjustment disorder with depressed mood: Secondary | ICD-10-CM

## 2019-03-20 MED ORDER — SERTRALINE HCL 100 MG PO TABS: 100.0000 mg | ORAL_TABLET | Freq: Every day | ORAL | 2 refills | Status: DC

## 2019-03-27 DIAGNOSIS — D649 Anemia, unspecified: Secondary | ICD-10-CM | POA: Insufficient documentation

## 2019-03-27 DIAGNOSIS — E559 Vitamin D deficiency, unspecified: Secondary | ICD-10-CM | POA: Insufficient documentation

## 2019-03-27 DIAGNOSIS — F325 Major depressive disorder, single episode, in full remission: Secondary | ICD-10-CM | POA: Insufficient documentation

## 2019-03-27 DIAGNOSIS — F3341 Major depressive disorder, recurrent, in partial remission: Secondary | ICD-10-CM | POA: Insufficient documentation

## 2019-03-27 DIAGNOSIS — E538 Deficiency of other specified B group vitamins: Secondary | ICD-10-CM | POA: Insufficient documentation

## 2019-03-27 NOTE — Progress Notes (Signed)
MEDICARE WELLNESS  THIS ENCOUNTER IS A VIRTUAL/TELEPHONE VISIT DUE TO COVID-19 - PATIENT WAS NOT SEEN IN THE OFFICE.  PATIENT HAS CONSENTED TO VIRTUAL VISIT / TELEMEDICINE VISIT  This provider placed a call to [facility] using video, her appointment was changed to a virtual office visit to reduce the risk of exposure to the COVID-19 virus and to help remain healthy and safe. The virtual visit will also provide continuity of care. She verbalizes understanding.    Assessment and Plan:  Alcohol abuse with alcohol-induced disorder (HCC) -     LORazepam (ATIVAN) 0.5 MG tablet; 1 tablet in the morning, 1/2 tablet at lunch and dinner PRN anxiety, daughter will keep - patient has not had tremors/seizsures - continue to not drink  Medication management  Mixed hyperlipidemia Continue medications.  decrease fatty foods increase activity.   H/O prosthetic aortic valve replacement Has referral to cardio, no SOB, CP  B12 deficiency On B complex  Anemia, unspecified type Will monitor  Vitamin D deficiency Continue vitamin D  Depression, major, recurrent, in partial remission ([place]) zoloft increased to 50mg  last visit, will call in 4 weeks and may increase to 100mg  at that time Continue ativan, will increase morning dose to 0.5 mg due to anxiety enouraged to try to walk outside can call hospice number  Encounter for Medicare annual wellness exam 1 year  Fall, subsequent encounter -     ibuprofen (ADVIL,MOTRIN) 800 MG tablet; Take 3 x a day with food, avoid aleve with it, can take with tylenol -     traMADol (ULTRAM) 50 MG tablet; Take 1 tablet (50 mg total) by mouth every 6 (six) hours as needed for up to 5 days for severe pain (limit use due to fall risk). - encouraged to keep follow up at [place] 4 WEEKS Discussed med's effects and SE's. Screening labs and tests as requested with regular follow-up as recommended. Over 40 minutes of exam, counseling, chart  review, and complex, high level critical decision making was performed this visit.    Plan:   During the course of the visit the patient was educated and counseled about appropriate screening and preventive services including:    Pneumococcal vaccine   Prevnar 13  Influenza vaccine  Td vaccine  Screening electrocardiogram  Bone densitometry screening  Colorectal cancer screening  Diabetes screening  Glaucoma screening  Nutrition counseling   Advanced directives: requested   HPI  78 y.o. female  presents for evaluation as new patient and follow up for has Alcohol abuse with alcohol-induced disorder ([place]); Medication management; Mixed hyperlipidemia; H/O prosthetic aortic valve replacement; B12 deficiency; Anemia; Vitamin D deficiency; and Depression, major, recurrent, in partial remission ([place]) on their problem list..  Her blood pressure has been controlled at home, today their BP is   She does not workout. She denies chest pain, shortness of breath, dizziness.   She has history of anxiety, her husband had kidney cancer, passed very recently. Moved in with her daughter from [place].   She was given zoloft in Dec from Dr. Linna Darner, which was increase last visit to 50mg  and she was still given ativan that her daughter is giving to her. She is still having high anxiety however she has had a lot of changes with her husband passing, the move and now this pandemic. She has not had any wine, no tremors, no seizures. She is on ativan 1/2 3 x a day and trazadone 50mg  at night which is  helping with sleep. She would like her ativan at 1 pill in the morning if possible due to anxiety.   She is taking 2 advil in the morning and bed time but still struggling with her arm. She is in a lot of pain, only on advil 400mg  in AM and 400mg  PM. Will send in ibuprofen 800mg  TID and tramadol.  Suppose to have an ortho appointment Friday at 3, encouraged patient to keep.  She has good use of her arm, no  numbness tingling, swelling, or warmth distal.    Lab Results  Component Value Date   GFRNONAA 82 03/08/2019   Her daughter states she is on prevagin for her memory, she also had a low B1 and is on Bcomplex. She had normal CT head last visit. We discussed with the changes and stress can be related to this. Will continue zoloft and monitor memory issues. May need sleep study when we are able to evaluate better.   She has a history of hyperlipidemia on liptior and has history of bovine aortic valve replacement x 2, last one was 2016, she is due for an echo and referral to a local cardiologist was placed last visit.    Current Medications:  Current Outpatient Medications on File Prior to Visit  Medication Sig Dispense Refill  . atorvastatin (LIPITOR) 80 MG tablet Take 80 mg by mouth daily.    . sertraline (ZOLOFT) 100 MG tablet Take 1 tablet (100 mg total) by mouth daily. 90 tablet 2  . traZODone (DESYREL) 50 MG tablet 1/2-1 tablet for sleep 30 tablet 2  . VITAMIN D PO Take 1 tablet by mouth daily.     No current facility-administered medications on file prior to visit.    Allergies:  No Known Allergies Medical History:  She has Alcohol abuse with alcohol-induced disorder ([place]); Medication management; Mixed hyperlipidemia; H/O prosthetic aortic valve replacement; B12 deficiency; Anemia; Vitamin D deficiency; and Depression, major, recurrent, in partial remission ([place]) on their problem list.  Will request complete records from previous doctors  Patient Care Team: Unk Pinto, MD as PCP - General (Internal Medicine)  Surgical History:  She has a past surgical history that includes Cardiac valuve replacement (1999) and Cardiac valuve replacement (2016). Family History:  Herfamily history includes Cancer in her mother; Hyperlipidemia in her mother; Hypertension in her mother; Kidney disease in her father; Parkinson's disease in her father. Social History:  She reports that she quit  smoking about 40 years ago. Her smoking use included cigarettes. She has never used smokeless tobacco. She reports current alcohol use of about 19.0 standard drinks of alcohol per week. She reports that she does not use drugs.  MEDICARE WELLNESS OBJECTIVES: Physical activity: Current Exercise Habits: The patient does not participate in regular exercise at present, Exercise limited by: orthopedic condition(s) Cardiac risk factors: Cardiac Risk Factors include: advanced age (>51men, >69 women);dyslipidemia;hypertension;sedentary lifestyle Depression/mood screen:   Depression screen [facility] 2/9 03/28/2019  Decreased Interest 2  Down, Depressed, Hopeless 1  PHQ - 2 Score 3  Altered sleeping 0  Tired, decreased energy 1  Change in appetite 1  Feeling bad or failure about yourself  0  Trouble concentrating 0  Moving slowly or fidgety/restless 0  Suicidal thoughts 0  PHQ-9 Score 5  Difficult doing work/chores Somewhat difficult    ADLs:  In your present state of health, do you have any difficulty performing the following activities: 03/28/2019  Hearing? N  Vision? N  Difficulty concentrating or making decisions?  Y  Walking or climbing stairs? N  Dressing or bathing? Y  Doing errands, shopping? Y     Cognitive Testing  Alert? Yes  Normal Appearance?Yes  Oriented to person? Yes  Place? Yes   Time? Yes  Recall of three objects?  2/3  Can perform simple calculations? Yes  Displays appropriate judgment?Yes  Can read the correct time from a watch face?Yes  EOL planning: Does Patient Have a Medical Advance Directive?: Yes Type of Advance Directive: [place] Does patient want to make changes to medical advance directive?: No - Guardian declined    Review of Systems: ROS   Physical Exam: Estimated body mass index is 21.18 kg/m as calculated from the following:   Height as of this encounter: 5\' 4"  (1.626 m).   Weight as of 03/08/19: 123 lb 6.4 oz (56 kg). Ht 5\' 4"   (1.626 m)   BMI 21.18 kg/m  General Appearance: Well nourished well developed, in no apparent distress.  Eyes: conjunctiva no swelling or erythema ENT/Mouth: No hoarseness, No cough for duration of visit.  Neck: Supple  Respiratory: Respiratory effort normal, normal rate, no retractions or distress.   Cardio: Appears well-perfused, noncyanotic Musculoskeletal: left arm in a sling, no edema in left hand, good movement of left fingers with good sensation per patient.  Skin: visible skin without rashes, ecchymosis, erythema Neuro: Awake and oriented X 3,  Psych:  normal affect, Insight and Judgment appropriate.    Medicare Attestation I have personally reviewed: The patient's medical and social history Their use of alcohol, tobacco or illicit drugs Their current medications and supplements The patient's functional ability including ADLs,fall risks, home safety risks, cognitive, and hearing and visual impairment Diet and physical activities Evidence for depression or mood disorders  The patient's weight, height, BMI, and visual acuity have been recorded in the chart.  I have made referrals, counseling, and provided education to the patient based on review of the above and I have provided the patient with a written personalized care plan for preventive services.     Vicie Mutters 11:34 AM [facility]

## 2019-03-28 ENCOUNTER — Telehealth: Payer: Medicare Other | Admitting: Physician Assistant

## 2019-03-28 ENCOUNTER — Other Ambulatory Visit: Payer: Self-pay

## 2019-03-28 ENCOUNTER — Encounter: Payer: Self-pay | Admitting: Physician Assistant

## 2019-03-28 VITALS — Ht 64.0 in

## 2019-03-28 DIAGNOSIS — E559 Vitamin D deficiency, unspecified: Secondary | ICD-10-CM

## 2019-03-28 DIAGNOSIS — F1019 Alcohol abuse with unspecified alcohol-induced disorder: Secondary | ICD-10-CM

## 2019-03-28 DIAGNOSIS — D649 Anemia, unspecified: Secondary | ICD-10-CM | POA: Diagnosis not present

## 2019-03-28 DIAGNOSIS — R6889 Other general symptoms and signs: Secondary | ICD-10-CM

## 2019-03-28 DIAGNOSIS — Z789 Other specified health status: Secondary | ICD-10-CM | POA: Diagnosis not present

## 2019-03-28 DIAGNOSIS — Z0001 Encounter for general adult medical examination with abnormal findings: Secondary | ICD-10-CM

## 2019-03-28 DIAGNOSIS — F3341 Major depressive disorder, recurrent, in partial remission: Secondary | ICD-10-CM | POA: Diagnosis not present

## 2019-03-28 DIAGNOSIS — E782 Mixed hyperlipidemia: Secondary | ICD-10-CM | POA: Diagnosis not present

## 2019-03-28 DIAGNOSIS — E538 Deficiency of other specified B group vitamins: Secondary | ICD-10-CM

## 2019-03-28 DIAGNOSIS — W19XXXD Unspecified fall, subsequent encounter: Secondary | ICD-10-CM

## 2019-03-28 DIAGNOSIS — Z0000 Encounter for general adult medical examination without abnormal findings: Secondary | ICD-10-CM

## 2019-03-28 DIAGNOSIS — Z79899 Other long term (current) drug therapy: Secondary | ICD-10-CM

## 2019-03-28 DIAGNOSIS — Z952 Presence of prosthetic heart valve: Secondary | ICD-10-CM

## 2019-03-28 MED ORDER — IBUPROFEN 800 MG PO TABS: ORAL_TABLET | ORAL | 0 refills | Status: DC

## 2019-03-28 MED ORDER — LORAZEPAM 0.5 MG PO TABS: ORAL_TABLET | ORAL | 0 refills | Status: DC

## 2019-03-28 MED ORDER — TRAMADOL HCL 50 MG PO TABS: 50.0000 mg | ORAL_TABLET | Freq: Four times a day (QID) | ORAL | 0 refills | Status: DC | PRN

## 2019-03-28 NOTE — Progress Notes (Signed)
  Comprehensive care plan: Depression, alcohol abuse, fall risk Patient/caregiver was given comprehensive care plan We will continue to monitor these goals every 3 months with an office visit and every month by a telephone call  Patient can contact the office any time with the phone number and get to a provider via the answering service or they can use Mychart.   Verbal permission was received from the patient to review comprehensive care management, they understand they have the right to stop CCM services at any time.   The patient is self managing medications at home.  Medications were reviewed with the patient today as well as adherence and potential interactions.   The patient does need home health services at this time. May benefit from PT later.

## 2019-03-28 NOTE — Patient Instructions (Signed)
If giving tramadol at night, stop the trazodone for now  If she has trouble sleeping with 0.25 ativan and tramadol, then add back 1/2 of the trazodone.  Follow up with ortho  Try to get outside, go for a walk   ibuprofen is an antiinflammatory You can take tylenol (500mg ) or tylenol arthritis (650mg ) with the meloxicam/antiinflammatories. The max you can take of tylenol a day is 3000mg  daily, this is a max of 6 pills a day of the regular tyelnol (500mg ) or a max of 4 a day of the tylenol arthritis (650mg ) as long as no other medications you are taking contain tylenol.   this can cause inflammation in your stomach and can cause ulcers or bleeding, this will look like black tarry stools Make sure you taking it with food Try not to take it daily, take AS needed Can take with pepcid   Continue zoloft 50mg , we will talk again in 4 weeks and at that time may think about increasing to the 100mg   May want a sleep study at some point. See why below  I think it is possible that you have sleep apnea. It can cause interrupted sleep, headaches, frequent awakenings, fatigue, dry mouth, fast/slow heart beats, memory issues, anxiety/depression, swelling, numbness tingling hands/feet, weight gain, shortness of breath, and the list goes on. Sleep apnea needs to be ruled out because if it is left untreated it does eventually lead to abnormal heart beats, lung failure or heart failure as well as increasing the risk of heart attack and stroke. There are masks you can wear OR a mouth piece that I can give you information about. Often times though people feel MUCH better after getting treatment.   Sleep Apnea  Sleep apnea is a sleep disorder characterized by abnormal pauses in breathing while you sleep. When your breathing pauses, the level of oxygen in your blood decreases. This causes you to move out of deep sleep and into light sleep. As a result, your quality of sleep is poor, and the system that carries your  blood throughout your body (cardiovascular system) experiences stress. If sleep apnea remains untreated, the following conditions can develop:  High blood pressure (hypertension).  Coronary artery disease.  Inability to achieve or maintain an erection (impotence).  Impairment of your thought process (cognitive dysfunction). There are three types of sleep apnea: 1. Obstructive sleep apnea--Pauses in breathing during sleep because of a blocked airway. 2. Central sleep apnea--Pauses in breathing during sleep because the area of the brain that controls your breathing does not send the correct signals to the muscles that control breathing. 3. Mixed sleep apnea--A combination of both obstructive and central sleep apnea.  RISK FACTORS The following risk factors can increase your risk of developing sleep apnea:  Being overweight.  Smoking.  Having narrow passages in your nose and throat.  Being of older age.  Being female.  Alcohol use.  Sedative and tranquilizer use.  Ethnicity. Among individuals younger than 35 years, African Americans are at increased risk of sleep apnea. SYMPTOMS   Difficulty staying asleep.  Daytime sleepiness and fatigue.  Loss of energy.  Irritability.  Loud, heavy snoring.  Morning headaches.  Trouble concentrating.  Forgetfulness.  Decreased interest in sex. DIAGNOSIS  In order to diagnose sleep apnea, your caregiver will perform a physical examination. Your caregiver may suggest that you take a home sleep test. Your caregiver may also recommend that you spend the night in a sleep lab. In the sleep lab, several  monitors record information about your heart, lungs, and brain while you sleep. Your leg and arm movements and blood oxygen level are also recorded. TREATMENT The following actions may help to resolve mild sleep apnea:  Sleeping on your side.   Using a decongestant if you have nasal congestion.   Avoiding the use of depressants,  including alcohol, sedatives, and narcotics.   Losing weight and modifying your diet if you are overweight. There also are devices and treatments to help open your airway:  Oral appliances. These are custom-made mouthpieces that shift your lower jaw forward and slightly open your bite. This opens your airway.  Devices that create positive airway pressure. This positive pressure "splints" your airway open to help you breathe better during sleep. The following devices create positive airway pressure:  Continuous positive airway pressure (CPAP) device. The CPAP device creates a continuous level of air pressure with an air pump. The air is delivered to your airway through a mask while you sleep. This continuous pressure keeps your airway open.  Nasal expiratory positive airway pressure (EPAP) device. The EPAP device creates positive air pressure as you exhale. The device consists of single-use valves, which are inserted into each nostril and held in place by adhesive. The valves create very little resistance when you inhale but create much more resistance when you exhale. That increased resistance creates the positive airway pressure. This positive pressure while you exhale keeps your airway open, making it easier to breath when you inhale again.  Bilevel positive airway pressure (BPAP) device. The BPAP device is used mainly in patients with central sleep apnea. This device is similar to the CPAP device because it also uses an air pump to deliver continuous air pressure through a mask. However, with the BPAP machine, the pressure is set at two different levels. The pressure when you exhale is lower than the pressure when you inhale.  Surgery. Typically, surgery is only done if you cannot comply with less invasive treatments or if the less invasive treatments do not improve your condition. Surgery involves removing excess tissue in your airway to create a wider passage way. Document Released: 12/03/2002  Document Revised: 04/09/2013 Document Reviewed: 04/20/2012 [facility] Patient Information 2015 [place], [place]. This information is not intended to replace advice given to you by your health care provider. Make sure you discuss any questions you have with your health care provider.

## 2019-03-30 DIAGNOSIS — M25512 Pain in left shoulder: Secondary | ICD-10-CM | POA: Diagnosis not present

## 2019-04-03 ENCOUNTER — Other Ambulatory Visit: Payer: Medicare Other | Admitting: Physician Assistant

## 2019-04-03 DIAGNOSIS — F3341 Major depressive disorder, recurrent, in partial remission: Secondary | ICD-10-CM | POA: Diagnosis not present

## 2019-04-03 DIAGNOSIS — W5501XA Bitten by cat, initial encounter: Secondary | ICD-10-CM | POA: Diagnosis not present

## 2019-04-03 DIAGNOSIS — F1019 Alcohol abuse with unspecified alcohol-induced disorder: Secondary | ICD-10-CM

## 2019-04-03 MED ORDER — AMOXICILLIN-POT CLAVULANATE 875-125 MG PO TABS: 1.0000 | ORAL_TABLET | Freq: Two times a day (BID) | ORAL | 0 refills | Status: DC

## 2019-04-03 NOTE — Progress Notes (Signed)
THIS ENCOUNTER IS A VIRTUAL VISIT DUE TO COVID-19 - PATIENT WAS NOT SEEN IN THE OFFICE.  PATIENT HAS CONSENTED TO VIRTUAL VISIT / TELEMEDICINE VISIT   Virtual Visit via telephone Note  I connected with Connie Alvarado on 04/03/19  by telephone.  I verified that I am speaking with the correct person using two identifiers.    I discussed the limitations of evaluation and management by telemedicine and the availability of in person appointments. The patient expressed understanding and agreed to proceed.  History of Present Illness: Patient's mom was bitten by her cat, one puncture wound on her right arm, states has sudden swelling, redness and swelling is worse today, no streaking, no personal fever.  . She has washed it with soap and water and used Polysporin.   She is doing better with pain, she is not drinking still. She is on ativan AS needed, has done 1 in the AM and doing better with anxiety.  She is on zoloft 50mg  and we will still wait 2-4 weeks and may increase at that time.  She has been talking with Dr. Tamera Punt about her arm and is no longer in the sling at this time.   Medications   Current Outpatient Medications (Cardiovascular):  .  atorvastatin (LIPITOR) 80 MG tablet, Take 80 mg by mouth daily.   Current Outpatient Medications (Analgesics):  .  ibuprofen (ADVIL,MOTRIN) 800 MG tablet, Take 3 x a day with food, avoid aleve with it, can take with tylenol   Current Outpatient Medications (Other):  .  amoxicillin-clavulanate (AUGMENTIN) 875-125 MG tablet, Take 1 tablet by mouth 2 (two) times daily for 7 days. Marland Kitchen  LORazepam (ATIVAN) 0.5 MG tablet, 1 tablet in the morning, 1/2 tablet at lunch and dinner PRN anxiety, daughter will keep .  sertraline (ZOLOFT) 100 MG tablet, Take 1 tablet (100 mg total) by mouth daily. .  traZODone (DESYREL) 50 MG tablet, 1/2-1 tablet for sleep .  VITAMIN D PO, Take 1 tablet by mouth daily.  Problem list She has Alcohol abuse with alcohol-induced  disorder ([place]); Medication management; Mixed hyperlipidemia; H/O prosthetic aortic valve replacement; B12 deficiency; Anemia; Vitamin D deficiency; Depression, major, recurrent, in partial remission ([place]); and Enrolled in chronic care management on their problem list.   Observations/Objective: General Appearance:Well sounding, in no apparent distress.  ENT/Mouth: No hoarseness, No cough for duration of visit.  Respiratory: completing full sentences without distress, without audible wheeze Neuro: Awake and oriented X 3,  Psych:  Insight and Judgment appropriate.   Daughter will send pics via mychart but she sent them to my phone.  She has a wound on right medial forwarm, near radial process with erythema, swelling extending about 3-4 inches. No visible streaking.    Assessment and Plan: Cat bite Will send in augmentin Continue to monitor for streaking, worsening swelling, fever  Goals Addressed            This Visit's Progress   . Get counseling for depression       Continue zoloft, consider counseling    . LIFESTYLE - DECREASE FALLS RISK       Decrease alcohol and controlled medication use.        Follow Up Instructions:  I discussed the assessment and treatment plan with the patient. The patient was provided an opportunity to ask questions and all were answered. The patient agreed with the plan and demonstrated an understanding of the instructions.   The patient was advised to call back or seek  an in-person evaluation if the symptoms worsen or if the condition fails to improve as anticipated.  I provided 64mins minutes of non-face-to-face time during this encounter.   Vicie Mutters, PA-C

## 2019-04-16 DIAGNOSIS — M25512 Pain in left shoulder: Secondary | ICD-10-CM | POA: Diagnosis not present

## 2019-04-17 ENCOUNTER — Telehealth: Payer: Self-pay

## 2019-04-17 NOTE — Telephone Encounter (Signed)
Please schedule a virtual visit or office visit.

## 2019-04-17 NOTE — Telephone Encounter (Signed)
Broke arm, had a follow up appointment with Dr. Tamera Punt and its not any better. She may have to have surgery in 6 weeks, feeling very anxious. Left office without asking for pain meds, requesting a prescription for Tramadol. May also need a virtual visit regarding the surgery. Please advise.

## 2019-04-18 NOTE — Progress Notes (Signed)
MEDICARE WELLNESS  THIS ENCOUNTER IS A VIRTUAL/TELEPHONE VISIT DUE TO COVID-19 - PATIENT WAS NOT SEEN IN THE OFFICE.  PATIENT HAS CONSENTED TO VIRTUAL VISIT / TELEMEDICINE VISIT  This provider placed a call to [facility] using video, her appointment was changed to a virtual office visit to reduce the risk of exposure to the COVID-19 virus and to help remain healthy and safe. The virtual visit will also provide continuity of care. She verbalizes understanding.    Assessment and Plan:  Alcohol abuse with alcohol-induced disorder (HCC) Avoid ETOH No seizures/DT's  Continue ativan PRN  Mixed hyperlipidemia check lipids decrease fatty foods increase activity.   B12 deficiency Continue B vitamins  Anemia, unspecified type Monitor  Depression, major, recurrent, in partial remission (HCC) -     sertraline (ZOLOFT) 100 MG tablet; 1 tablet in the morning and 1/2 at night Will increase Discussed possible risk of serotonin with Zoloft and trazodone.  If not doing better may add wellbutrin and cut back on zoloft  Grief -     sertraline (ZOLOFT) 100 MG tablet; 1 tablet in the morning and 1/2 at night - strongly suggest counseling  Fall, subsequent encounter -     traMADol (ULTRAM) 50 MG tablet; Take 1 tablet (50 mg total) by mouth every 6 (six) hours as needed for up to 5 days for severe pain (limit use due to fall risk). - continue follow up Dr. Tamera Punt - daughter will keep her controlled substances      CLOSE FOLLOW UP 4 WEEKS WITH CCM Discussed med's effects and SE's. Screening labs and tests as requested with regular follow-up as recommended. Over 30 minutes of exam, counseling, chart review, and complex, high level critical decision making was performed this visit.   HPI  78 y.o. female  presents for follow up for has Alcohol abuse with alcohol-induced disorder ([place]); Medication management; Mixed hyperlipidemia; H/O prosthetic aortic valve replacement; B12 deficiency;  Anemia; Vitamin D deficiency; Depression, major, recurrent, in partial remission ([place]); and Enrolled in chronic care management on their problem list..  She fractured her left shoulder, had recent appt with Dr. Tamera Punt with an xray, she states it is not healing properly and they will repeat an xray in 6 weeks and may need surgery. She was also bit by her cat and is back on augmentin. No diarrhea but have had loose stools.  She states if she is sitting or not doing anything she has minimal pain but if she picks up a plate or moves anything she has pain in that arm.  She is on ibuprofen 800 with breakfast and dinner. She was on tramadol but has not taken it because she is out, given 20 on 03/28/2019.   She is still having depression and is on 1 pill 100mg  zoloft, she is interested in increasing this. no numbness tingling, swelling, or warmth distal.   She is on trazodone 1/2 at night and doing well.    Her blood pressure has been controlled at home, today their BP is BP: (!) 159/90 She does not workout. She denies chest pain, shortness of breath, dizziness.   She has history of anxiety, her husband had kidney cancer, passed very recently. Moved in with her daughter from [place].     Lab Results  Component Value Date   GFRNONAA 82 03/08/2019   Her daughter states she is on prevagin for her memory, she also had a low B1 and is on Bcomplex. She had normal CT head last visit.  We discussed with the changes and stress can be related to this. Will continue zoloft and monitor memory issues. May need sleep study when we are able to evaluate better.   Current Medications:  Current Outpatient Medications on File Prior to Visit  Medication Sig Dispense Refill  . atorvastatin (LIPITOR) 80 MG tablet Take 80 mg by mouth daily.    Marland Kitchen ibuprofen (ADVIL,MOTRIN) 800 MG tablet Take 3 x a day with food, avoid aleve with it, can take with tylenol 60 tablet 0  . LORazepam (ATIVAN) 0.5 MG tablet 1 tablet in the morning,  1/2 tablet at lunch and dinner PRN anxiety, daughter will keep 180 tablet 0  . sertraline (ZOLOFT) 100 MG tablet Take 1 tablet (100 mg total) by mouth daily. 90 tablet 2  . traZODone (DESYREL) 50 MG tablet 1/2-1 tablet for sleep 30 tablet 2  . VITAMIN D PO Take 1 tablet by mouth daily.     No current facility-administered medications on file prior to visit.    Allergies:  No Known Allergies Medical History:  She has Alcohol abuse with alcohol-induced disorder ([place]); Medication management; Mixed hyperlipidemia; H/O prosthetic aortic valve replacement; B12 deficiency; Anemia; Vitamin D deficiency; Depression, major, recurrent, in partial remission ([place]); and Enrolled in chronic care management on their problem list.  Will request complete records from previous doctors  Surgical History:  She has a past surgical history that includes Cardiac valuve replacement (1999) and Cardiac valuve replacement (2016). Family History:  Herfamily history includes Cancer in her mother; Hyperlipidemia in her mother; Hypertension in her mother; Kidney disease in her father; Parkinson's disease in her father. Social History:  She reports that she quit smoking about 40 years ago. Her smoking use included cigarettes. She has never used smokeless tobacco. She reports current alcohol use of about 19.0 standard drinks of alcohol per week. She reports that she does not use drugs.   Review of Systems: Review of Systems  Constitutional: Negative.   HENT: Negative.   Eyes: Negative.   Respiratory: Negative.   Cardiovascular: Negative.   Gastrointestinal: Negative.   Genitourinary: Negative.   Musculoskeletal: Negative.   Skin: Negative.   Neurological: Negative.   Endo/Heme/Allergies: Negative.   Psychiatric/Behavioral: Negative.      Physical Exam: Estimated body mass index is 21.18 kg/m as calculated from the following:   Height as of this encounter: 5\' 4"  (1.626 m).   Weight as of 03/08/19: 123 lb 6.4  oz (56 kg). BP (!) 159/90   Ht 5\' 4"  (1.626 m)   BMI 21.18 kg/m  General Appearance: Well nourished well developed, in no apparent distress.  Eyes: conjunctiva no swelling or erythema ENT/Mouth: No hoarseness, No cough for duration of visit.  Neck: Supple  Respiratory: Respiratory effort normal, normal rate, no retractions or distress.   Cardio: Appears well-perfused, noncyanotic Musculoskeletal: left arm in a sling, no edema in left hand, good movement of left fingers with good sensation per patient.  Skin: visible skin without rashes, ecchymosis, erythema Neuro: Awake and oriented X 3,  Psych:  normal affect, Insight and Judgment appropriate.    Vicie Mutters 2:48 PM [facility]

## 2019-04-19 ENCOUNTER — Other Ambulatory Visit: Payer: Self-pay

## 2019-04-19 ENCOUNTER — Encounter: Payer: Self-pay | Admitting: Physician Assistant

## 2019-04-19 ENCOUNTER — Telehealth: Payer: Medicare Other | Admitting: Physician Assistant

## 2019-04-19 VITALS — BP 136/93 | Ht 64.0 in | Wt 118.0 lb

## 2019-04-19 DIAGNOSIS — Z79899 Other long term (current) drug therapy: Secondary | ICD-10-CM

## 2019-04-19 DIAGNOSIS — F4321 Adjustment disorder with depressed mood: Secondary | ICD-10-CM | POA: Diagnosis not present

## 2019-04-19 DIAGNOSIS — F3341 Major depressive disorder, recurrent, in partial remission: Secondary | ICD-10-CM | POA: Diagnosis not present

## 2019-04-19 DIAGNOSIS — E782 Mixed hyperlipidemia: Secondary | ICD-10-CM

## 2019-04-19 DIAGNOSIS — D649 Anemia, unspecified: Secondary | ICD-10-CM

## 2019-04-19 DIAGNOSIS — F1019 Alcohol abuse with unspecified alcohol-induced disorder: Secondary | ICD-10-CM

## 2019-04-19 DIAGNOSIS — W19XXXD Unspecified fall, subsequent encounter: Secondary | ICD-10-CM

## 2019-04-19 DIAGNOSIS — E538 Deficiency of other specified B group vitamins: Secondary | ICD-10-CM

## 2019-04-19 MED ORDER — SERTRALINE HCL 100 MG PO TABS: ORAL_TABLET | ORAL | 2 refills | Status: DC

## 2019-04-19 MED ORDER — TRAMADOL HCL 50 MG PO TABS: 50.0000 mg | ORAL_TABLET | Freq: Four times a day (QID) | ORAL | 0 refills | Status: AC | PRN

## 2019-04-19 NOTE — Progress Notes (Signed)
Patient would like a refill on pain meds at this time

## 2019-04-30 DIAGNOSIS — H53469 Homonymous bilateral field defects, unspecified side: Secondary | ICD-10-CM | POA: Diagnosis not present

## 2019-04-30 DIAGNOSIS — H35033 Hypertensive retinopathy, bilateral: Secondary | ICD-10-CM | POA: Diagnosis not present

## 2019-04-30 DIAGNOSIS — H532 Diplopia: Secondary | ICD-10-CM | POA: Diagnosis not present

## 2019-05-02 ENCOUNTER — Other Ambulatory Visit: Payer: Self-pay

## 2019-05-02 ENCOUNTER — Ambulatory Visit (INDEPENDENT_AMBULATORY_CARE_PROVIDER_SITE_OTHER): Payer: Medicare Other | Admitting: Physician Assistant

## 2019-05-02 ENCOUNTER — Encounter: Payer: Self-pay | Admitting: Physician Assistant

## 2019-05-02 VITALS — BP 132/74 | HR 72 | Temp 98.2°F | Ht 64.0 in | Wt 117.6 lb

## 2019-05-02 DIAGNOSIS — R413 Other amnesia: Secondary | ICD-10-CM | POA: Diagnosis not present

## 2019-05-02 DIAGNOSIS — R197 Diarrhea, unspecified: Secondary | ICD-10-CM

## 2019-05-02 DIAGNOSIS — R29818 Other symptoms and signs involving the nervous system: Secondary | ICD-10-CM | POA: Diagnosis not present

## 2019-05-02 DIAGNOSIS — R251 Tremor, unspecified: Secondary | ICD-10-CM | POA: Diagnosis not present

## 2019-05-02 MED ORDER — CITALOPRAM HYDROBROMIDE 20 MG PO TABS: 20.0000 mg | ORAL_TABLET | Freq: Every day | ORAL | 2 refills | Status: DC

## 2019-05-02 NOTE — Patient Instructions (Addendum)
Stop the zoloft Start celexa 10mg  (1/2 tablet) x 1-2 weeks, and then increase to 1 pill a day  Stop the ice cream at night- try lactate free or sorbet  May need sleep study  Will refer to neuro and get MRI  She was informed to call 911 if she develop any new symptoms such as worsening headaches, episodes of blurred vision, double vision or complete loss of vision or speech difficulties or motor weakness.   Food Choices to Help Relieve Diarrhea, Adult When you have diarrhea, the foods you eat and your eating habits are very important. Choosing the right foods and drinks can help:  Relieve diarrhea.  Replace lost fluids and nutrients.  Prevent dehydration. What general guidelines should I follow?  Relieving diarrhea  Choose foods with less than 2 g or .07 oz. of fiber per serving.  Limit fats to less than 8 tsp (38 g or 1.34 oz.) a day.  Avoid the following: ? Foods and beverages sweetened with high-fructose corn syrup, honey, or sugar alcohols such as xylitol, sorbitol, and mannitol. ? Foods that contain a lot of fat or sugar. ? Fried, greasy, or spicy foods. ? High-fiber grains, breads, and cereals. ? Raw fruits and vegetables.  Eat foods that are rich in probiotics. These foods include dairy products such as yogurt and fermented milk products. They help increase healthy bacteria in the stomach and intestines (gastrointestinal tract, or GI tract).  If you have lactose intolerance, avoid dairy products. These may make your diarrhea worse.  Take medicine to help stop diarrhea (antidiarrheal medicine) only as told by your health care provider. Replacing nutrients  Eat small meals or snacks every 3-4 hours.  Eat bland foods, such as white rice, toast, or baked potato, until your diarrhea starts to get better. Gradually reintroduce nutrient-rich foods as tolerated or as told by your health care provider. This includes: ? Well-cooked protein foods. ? Peeled, seeded, and  soft-cooked fruits and vegetables. ? Low-fat dairy products.  Take vitamin and mineral supplements as told by your health care provider. Preventing dehydration  Start by sipping water or a special solution to prevent dehydration (oral rehydration solution, ORS). Urine that is clear or pale yellow means that you are getting enough fluid.  Try to drink at least 8-10 cups of fluid each day to help replace lost fluids.  You may add other liquids in addition to water, such as clear juice or decaffeinated sports drinks, as tolerated or as told by your health care provider.  Avoid drinks with caffeine, such as coffee, tea, or soft drinks.  Avoid alcohol. What foods are recommended?     The items listed may not be a complete list. Talk with your health care provider about what dietary choices are best for you. Grains White rice. White, [place], or pita breads (fresh or toasted), including plain rolls, buns, or bagels. White pasta. Saltine, soda, or graham crackers. Pretzels. Low-fiber cereal. Cooked cereals made with water (such as cornmeal, farina, or cream cereals). Plain muffins. Matzo. Melba toast. Zwieback. Vegetables Potatoes (without the skin). Most well-cooked and canned vegetables without skins or seeds. Tender lettuce. Fruits Apple sauce. Fruits canned in juice. Cooked apricots, cherries, grapefruit, peaches, pears, or plums. Fresh bananas and cantaloupe. Meats and other protein foods Baked or boiled chicken. Eggs. Tofu. Fish. Seafood. Smooth nut butters. Ground or well-cooked tender beef, ham, veal, lamb, pork, or poultry. Dairy Plain yogurt, kefir, and unsweetened liquid yogurt. Lactose-free milk, buttermilk, skim milk, or soy milk.  Low-fat or nonfat hard cheese. Beverages Water. Low-calorie sports drinks. Fruit juices without pulp. Strained tomato and vegetable juices. Decaffeinated teas. Sugar-free beverages not sweetened with sugar alcohols. Oral rehydration solutions, if  approved by your health care provider. Seasoning and other foods Bouillon, broth, or soups made from recommended foods. What foods are not recommended? The items listed may not be a complete list. Talk with your health care provider about what dietary choices are best for you. Grains Whole grain, whole wheat, bran, or rye breads, rolls, pastas, and crackers. Wild or brown rice. Whole grain or bran cereals. Barley. Oats and oatmeal. Corn tortillas or taco shells. Granola. Popcorn. Vegetables Raw vegetables. Fried vegetables. Cabbage, broccoli, Brussels sprouts, artichokes, baked beans, beet greens, corn, kale, legumes, peas, sweet potatoes, and yams. Potato skins. Cooked spinach and cabbage. Fruits Dried fruit, including raisins and dates. Raw fruits. Stewed or dried prunes. Canned fruits with syrup. Meat and other protein foods Fried or fatty meats. Deli meats. Chunky nut butters. Nuts and seeds. Beans and lentils. Berniece Salines. Hot dogs. Sausage. Dairy High-fat cheeses. Whole milk, chocolate milk, and beverages made with milk, such as milk shakes. Half-and-half. Cream. sour cream. Ice cream. Beverages Caffeinated beverages (such as coffee, tea, soda, or energy drinks). Alcoholic beverages. Fruit juices with pulp. Prune juice. Soft drinks sweetened with high-fructose corn syrup or sugar alcohols. High-calorie sports drinks. Fats and oils Butter. Cream sauces. Margarine. Salad oils. Plain salad dressings. Olives. Avocados. Mayonnaise. Sweets and desserts Sweet rolls, doughnuts, and sweet breads. Sugar-free desserts sweetened with sugar alcohols such as xylitol and sorbitol. Seasoning and other foods Honey. Hot sauce. Chili powder. Gravy. Cream-based or milk-based soups. Pancakes and waffles. Summary  When you have diarrhea, the foods you eat and your eating habits are very important.  Make sure you get at least 8-10 cups of fluid each day, or enough to keep your urine clear or pale yellow.  Eat  bland foods and gradually reintroduce healthy, nutrient-rich foods as tolerated, or as told by your health care provider.  Avoid high-fiber, fried, greasy, or spicy foods. This information is not intended to replace advice given to you by your health care provider. Make sure you discuss any questions you have with your health care provider. Document Released: 03/04/2004 Document Revised: 12/10/2016 Document Reviewed: 12/10/2016 Elsevier Interactive Patient Education  2019 [employer].

## 2019-05-02 NOTE — Progress Notes (Signed)
Very complicated patient with multiple co morbidities and difficult history, history from daughter, lauren as well.   Assessment and Plan: Diarrhea Around the same time with zoloft increase, will switch to celexa 20 mg, start 10 mg.  Has had several recent ABX, will check for Cdiff and treat with oral vancomycin With patient's history of alochol use, will check fecal fat and pancreatirc enzymes to rule out chronic pancreatitis With weight loss if labs normal- will get CT AB to rule out CA Had normal colonoscopy -     CBC with Differential/Platelet -     COMPLETE METABOLIC PANEL WITH GFR -     TSH -     Fecal Fat, Qualitative; Future -     Clostridium difficile Toxin B, Qualitative, Real-Time PCR; Future -     Amylase -     Lipase  Abnormal eye exam, memory loss, imbalance, tremor With memory issues, imbalance, and now abnormal eye exam will get MRI to rule out stroke. If positive will proceed with stroke work up with cardiology, neuro, echo, holter, and carotid US- has AVR history  Tremor -     Ambulatory referral to Neurology With intention, no other signs of parkinson's, worse since not drinking - will monitor, if not better will refer to neuro  Memory loss -     MR Brain W Wo Contrast; Future -     Ambulatory referral to Neurology- PATIENT DECLINES AT THIS TIME, WILL GET MRI WITH AND WITHOUT AND WILL REFER PENDING LABS CONTINUE B COMPLEX - switch zoloft to celexa  Discussed med's effects and SE's. Screening labs and tests as requested with regular follow-up as recommended. Over 60 minutes of exam, counseling, chart review, and complex, high level critical decision making was performed this visit.  Future Appointments  Date Time Provider [place]  06/21/2019  9:40 AM Crenshaw, Denice Bors, MD [facility]    HPI  78 y.o. female  presents for follow up for has Alcohol abuse with alcohol-induced disorder ([place]); Medication management; Mixed hyperlipidemia; H/O  prosthetic aortic valve replacement; B12 deficiency; Anemia; Vitamin D deficiency; Depression, major, recurrent, in partial remission ([place]); and Enrolled in chronic care management on their problem list..  She has been having diarrhea and weight loss. She has a history of alcoholism, still has gallbladder.  The diarrhea is 3 weeks, will have urgency, 2-3 x a day at the most, will have watery/mushy stool. Usually morning or in the middle of night. She doesn't eat much and eats the same things. She will eat ice cream at night.  She is on zoloft due to depression from the loss of her husband, recent move and pandemic. She is not on any new supplements.  She has had 2 rounds of ABX due to a cat bite.  She denies fever, AB pain,  She had a normal colonoscopy 2018.   She has had shaking x oct or longer but has gotten worse. Not associated with drinking or not drinking. She does drink a lot of coffee per daughter. Father has parkinson's. Tremor is with intention. No family history of essential tremor. Alcohol did help the tremor, she is not drinking as much now.   She is on trazodone 1/2 at night and doing well.   Daughter states her memory continues to worsen, she has had double vision, she also saw her eye doctor recently and was told she may have had a stroke, we do not have the records at this time. She had a  normal CT head WITHOUT contrast 03/08/2019. She does have a history of AVR repair and needs follow up.   BMI is Body mass index is 20.19 kg/m. Wt Readings from Last 8 Encounters:  05/02/19 117 lb 9.6 oz (53.3 kg)  04/19/19 118 lb (53.5 kg)  03/08/19 123 lb 6.4 oz (56 kg)  12/18/18 129 lb 3.2 oz (58.6 kg)   She fractured her left shoulder, had recent appt with Dr. Tamera Punt with an xray, she states it is not healing properly and they will repeat an xray in 6 weeks and may need surgery.   Her blood pressure has been controlled at home, today their BP is BP: 132/74   Lab Results  Component  Value Date   GFRNONAA 82 03/08/2019    Current Medications:  Current Outpatient Medications on File Prior to Visit  Medication Sig Dispense Refill  . atorvastatin (LIPITOR) 80 MG tablet Take 80 mg by mouth daily.    Marland Kitchen ibuprofen (ADVIL,MOTRIN) 800 MG tablet Take 3 x a day with food, avoid aleve with it, can take with tylenol 60 tablet 0  . LORazepam (ATIVAN) 0.5 MG tablet 1 tablet in the morning, 1/2 tablet at lunch and dinner PRN anxiety, daughter will keep 180 tablet 0  . sertraline (ZOLOFT) 100 MG tablet 1 tablet in the morning and 1/2 at night 180 tablet 2  . traZODone (DESYREL) 50 MG tablet 1/2-1 tablet for sleep 30 tablet 2  . VITAMIN D PO Take 1 tablet by mouth daily.     No current facility-administered medications on file prior to visit.    Allergies:  No Known Allergies Medical History:  She has Alcohol abuse with alcohol-induced disorder ([place]); Medication management; Mixed hyperlipidemia; H/O prosthetic aortic valve replacement; B12 deficiency; Anemia; Vitamin D deficiency; Depression, major, recurrent, in partial remission ([place]); and Enrolled in chronic care management on their problem list.  Will request complete records from previous doctors  Surgical History:  She has a past surgical history that includes Cardiac valuve replacement (1999) and Cardiac valuve replacement (2016). Family History:  Herfamily history includes Cancer in her mother; Hyperlipidemia in her mother; Hypertension in her mother; Kidney disease in her father; Parkinson's disease in her father. Social History:  She reports that she quit smoking about 40 years ago. Her smoking use included cigarettes. She has never used smokeless tobacco. She reports current alcohol use of about 19.0 standard drinks of alcohol per week. She reports that she does not use drugs.   Review of Systems: Review of Systems  Constitutional: Positive for malaise/fatigue and weight loss. Negative for chills, diaphoresis and fever.   HENT: Negative.   Eyes: Negative.   Respiratory: Negative.   Cardiovascular: Negative.   Gastrointestinal: Positive for diarrhea.  Genitourinary: Negative.   Musculoskeletal: Positive for falls and joint pain. Negative for back pain, myalgias and neck pain.  Skin: Negative.   Neurological: Positive for dizziness (very rare in the morning) and tremors. Negative for tingling, sensory change, speech change, focal weakness, seizures, loss of consciousness, weakness and headaches.  Endo/Heme/Allergies: Negative.   Psychiatric/Behavioral: Positive for depression, memory loss and substance abuse. Negative for hallucinations and suicidal ideas. The patient is nervous/anxious. The patient does not have insomnia.      Physical Exam: Estimated body mass index is 20.19 kg/m as calculated from the following:   Height as of this encounter: 5\' 4"  (1.626 m).   Weight as of this encounter: 117 lb 9.6 oz (53.3 kg). BP 132/74   Pulse  72   Temp 98.2 F (36.8 C)   Ht 5\' 4"  (1.626 m)   Wt 117 lb 9.6 oz (53.3 kg)   SpO2 98%   BMI 20.19 kg/m  General Appearance: elderly frail appearing female in no apparent distress Eyes: PERRLA, EOMs, conjunctiva no swelling or erythema Sinuses: No Frontal/maxillary tenderness ENT/Mouth: Ext aud canals clear, normal light reflex with TMs without erythema, bulging.  No erythema, swelling, or exudate on post pharynx. Tonsils not swollen or erythematous. Hearing good Neck: Supple, thyroid normal. No bruits  Respiratory: Respiratory effort normal, BS equal bilaterally without rales, rhonchi, wheezing or stridor.  Cardio: RRR with 2/6 systolic murmur without rubs or gallops.  Chest: symmetric, with normal excursions and percussion.  Abdomen: Soft, nontender, no guarding, rebound, hernias, masses, or organomegaly.  Lymphatics: Non tender without lymphadenopathy.  Musculoskeletal: Full ROM all peripheral extremities BUT left arm decreased ROM, pain with any movement, good  sensation and pulses bilateral hand, good grip strength antalgic, unsteady gait.  Skin: Warm, dry without rashes, lesions, ecchymosis. Neuro: Cranial nerves intact, some nystagmus horizontal,no cog wheeling, Normal muscle tone, wide based stance,  Sensation intact.  Psych: Awake and oriented X 3, normal affect, Insight and Judgment appropriate, depressed mood.     Vicie Mutters 1:54 PM [facility]

## 2019-05-03 ENCOUNTER — Ambulatory Visit: Payer: Medicare Other | Admitting: Physician Assistant

## 2019-05-03 ENCOUNTER — Other Ambulatory Visit: Payer: Self-pay | Admitting: Physician Assistant

## 2019-05-03 DIAGNOSIS — R634 Abnormal weight loss: Secondary | ICD-10-CM

## 2019-05-03 LAB — CBC WITH DIFFERENTIAL/PLATELET
Absolute Monocytes: 402 cells/uL (ref 200–950)
Basophils Absolute: 60 cells/uL (ref 0–200)
Basophils Relative: 1 %
Eosinophils Absolute: 102 cells/uL (ref 15–500)
Eosinophils Relative: 1.7 %
HCT: 37.6 % (ref 35.0–45.0)
Hemoglobin: 12.7 g/dL (ref 11.7–15.5)
Lymphs Abs: 708 cells/uL — ABNORMAL LOW (ref 850–3900)
MCH: 30.6 pg (ref 27.0–33.0)
MCHC: 33.8 g/dL (ref 32.0–36.0)
MCV: 90.6 fL (ref 80.0–100.0)
MPV: 11.8 fL (ref 7.5–12.5)
Monocytes Relative: 6.7 %
Neutro Abs: 4728 cells/uL (ref 1500–7800)
Neutrophils Relative %: 78.8 %
Platelets: 186 10*3/uL (ref 140–400)
RBC: 4.15 10*6/uL (ref 3.80–5.10)
RDW: 12.6 % (ref 11.0–15.0)
Total Lymphocyte: 11.8 %
WBC: 6 10*3/uL (ref 3.8–10.8)

## 2019-05-03 LAB — COMPLETE METABOLIC PANEL WITH GFR
AG Ratio: 1.7 (calc) (ref 1.0–2.5)
ALT: 32 U/L — ABNORMAL HIGH (ref 6–29)
AST: 38 U/L — ABNORMAL HIGH (ref 10–35)
Albumin: 4 g/dL (ref 3.6–5.1)
Alkaline phosphatase (APISO): 78 U/L (ref 37–153)
BUN: 12 mg/dL (ref 7–25)
CO2: 28 mmol/L (ref 20–32)
Calcium: 9.1 mg/dL (ref 8.6–10.4)
Chloride: 108 mmol/L (ref 98–110)
Creat: 0.62 mg/dL (ref 0.60–0.93)
GFR, Est African American: 100 mL/min/{1.73_m2} (ref >=60)
GFR, Est Non African American: 86 mL/min/{1.73_m2} (ref >=60)
Globulin: 2.4 g/dL (calc) (ref 1.9–3.7)
Glucose, Bld: 145 mg/dL — ABNORMAL HIGH (ref 65–99)
Potassium: 3.8 mmol/L (ref 3.5–5.3)
Sodium: 143 mmol/L (ref 135–146)
Total Bilirubin: 0.6 mg/dL (ref 0.2–1.2)
Total Protein: 6.4 g/dL (ref 6.1–8.1)

## 2019-05-03 LAB — AMYLASE: Amylase: 16 U/L — ABNORMAL LOW (ref 21–101)

## 2019-05-03 LAB — TSH: TSH: 0.75 mIU/L (ref 0.40–4.50)

## 2019-05-03 LAB — LIPASE: Lipase: 31 U/L (ref 7–60)

## 2019-05-05 DIAGNOSIS — R634 Abnormal weight loss: Secondary | ICD-10-CM

## 2019-05-05 DIAGNOSIS — R251 Tremor, unspecified: Secondary | ICD-10-CM

## 2019-05-07 ENCOUNTER — Other Ambulatory Visit: Payer: Self-pay

## 2019-05-07 ENCOUNTER — Other Ambulatory Visit: Payer: Medicare Other

## 2019-05-07 DIAGNOSIS — R251 Tremor, unspecified: Secondary | ICD-10-CM | POA: Diagnosis not present

## 2019-05-07 DIAGNOSIS — R634 Abnormal weight loss: Secondary | ICD-10-CM

## 2019-05-07 LAB — HEPATIC FUNCTION PANEL
AG Ratio: 1.9 (calc) (ref 1.0–2.5)
ALT: 29 U/L (ref 6–29)
AST: 32 U/L (ref 10–35)
Albumin: 4.1 g/dL (ref 3.6–5.1)
Alkaline phosphatase (APISO): 72 U/L (ref 37–153)
Bilirubin, Direct: 0.1 mg/dL (ref 0.0–0.2)
Globulin: 2.2 g/dL (calc) (ref 1.9–3.7)
Indirect Bilirubin: 0.5 mg/dL (calc) (ref 0.2–1.2)
Total Bilirubin: 0.6 mg/dL (ref 0.2–1.2)
Total Protein: 6.3 g/dL (ref 6.1–8.1)

## 2019-05-07 LAB — AMMONIA: Ammonia: 29 umol/L (ref <=72)

## 2019-05-09 DIAGNOSIS — R197 Diarrhea, unspecified: Secondary | ICD-10-CM | POA: Diagnosis not present

## 2019-05-09 NOTE — Addendum Note (Signed)
Addended by: Eulis Canner on: 05/09/2019 11:45 AM   Modules accepted: Orders

## 2019-05-10 DIAGNOSIS — R197 Diarrhea, unspecified: Secondary | ICD-10-CM | POA: Diagnosis not present

## 2019-05-10 LAB — CLOSTRIDIUM DIFFICILE TOXIN B, QUALITATIVE, REAL-TIME PCR: Toxigenic C. Difficile by PCR: NOT DETECTED

## 2019-05-10 NOTE — Addendum Note (Signed)
Addended by: Eulis Canner on: 05/10/2019 09:34 AM   Modules accepted: Orders

## 2019-05-14 ENCOUNTER — Other Ambulatory Visit: Payer: Self-pay

## 2019-05-14 ENCOUNTER — Ambulatory Visit
Admission: RE | Admit: 2019-05-14 | Discharge: 2019-05-14 | Disposition: A | Discharge status: Home / Self Care | Payer: Medicare Other | Source: Ambulatory Visit | Attending: Physician Assistant | Admitting: Physician Assistant

## 2019-05-14 DIAGNOSIS — R634 Abnormal weight loss: Secondary | ICD-10-CM

## 2019-05-14 DIAGNOSIS — K5730 Diverticulosis of large intestine without perforation or abscess without bleeding: Secondary | ICD-10-CM | POA: Diagnosis not present

## 2019-05-14 LAB — FECAL FAT, QUALITATIVE: FECAL FAT, QUALITATIVE: NORMAL

## 2019-05-14 MED ORDER — IOPAMIDOL (ISOVUE-300) INJECTION 61%
100.0000 mL | Freq: Once | INTRAVENOUS | Status: AC | PRN
  Administered 2019-05-14: 100 mL via INTRAVENOUS

## 2019-05-15 ENCOUNTER — Other Ambulatory Visit: Payer: Self-pay | Admitting: Physician Assistant

## 2019-05-15 ENCOUNTER — Encounter: Payer: Self-pay | Admitting: Physician Assistant

## 2019-05-15 DIAGNOSIS — R634 Abnormal weight loss: Secondary | ICD-10-CM

## 2019-05-15 DIAGNOSIS — R197 Diarrhea, unspecified: Secondary | ICD-10-CM

## 2019-05-15 DIAGNOSIS — R918 Other nonspecific abnormal finding of lung field: Secondary | ICD-10-CM | POA: Insufficient documentation

## 2019-05-17 ENCOUNTER — Ambulatory Visit
Admission: RE | Admit: 2019-05-17 | Discharge: 2019-05-17 | Disposition: A | Discharge status: Home / Self Care | Payer: Medicare Other | Source: Ambulatory Visit | Attending: Physician Assistant | Admitting: Physician Assistant

## 2019-05-17 ENCOUNTER — Other Ambulatory Visit: Payer: Self-pay

## 2019-05-17 DIAGNOSIS — R413 Other amnesia: Secondary | ICD-10-CM | POA: Diagnosis not present

## 2019-05-17 DIAGNOSIS — R29818 Other symptoms and signs involving the nervous system: Secondary | ICD-10-CM

## 2019-05-17 MED ORDER — GADOBENATE DIMEGLUMINE 529 MG/ML IV SOLN
10.0000 mL | Freq: Once | INTRAVENOUS | Status: AC | PRN
  Administered 2019-05-17: 10 mL via INTRAVENOUS

## 2019-05-21 MED ORDER — SERTRALINE HCL 100 MG PO TABS: ORAL_TABLET | ORAL | 2 refills | Status: DC

## 2019-05-28 DIAGNOSIS — M25512 Pain in left shoulder: Secondary | ICD-10-CM | POA: Diagnosis not present

## 2019-06-01 DIAGNOSIS — M25612 Stiffness of left shoulder, not elsewhere classified: Secondary | ICD-10-CM | POA: Diagnosis not present

## 2019-06-01 DIAGNOSIS — S42225D 2-part nondisplaced fracture of surgical neck of left humerus, subsequent encounter for fracture with routine healing: Secondary | ICD-10-CM | POA: Diagnosis not present

## 2019-06-04 DIAGNOSIS — M25612 Stiffness of left shoulder, not elsewhere classified: Secondary | ICD-10-CM | POA: Diagnosis not present

## 2019-06-04 DIAGNOSIS — S42225D 2-part nondisplaced fracture of surgical neck of left humerus, subsequent encounter for fracture with routine healing: Secondary | ICD-10-CM | POA: Diagnosis not present

## 2019-06-06 ENCOUNTER — Other Ambulatory Visit: Payer: Self-pay | Admitting: Physician Assistant

## 2019-06-06 DIAGNOSIS — R634 Abnormal weight loss: Secondary | ICD-10-CM

## 2019-06-06 DIAGNOSIS — R197 Diarrhea, unspecified: Secondary | ICD-10-CM

## 2019-06-07 DIAGNOSIS — S42225D 2-part nondisplaced fracture of surgical neck of left humerus, subsequent encounter for fracture with routine healing: Secondary | ICD-10-CM | POA: Diagnosis not present

## 2019-06-07 DIAGNOSIS — M25612 Stiffness of left shoulder, not elsewhere classified: Secondary | ICD-10-CM | POA: Diagnosis not present

## 2019-06-08 ENCOUNTER — Encounter: Payer: Self-pay | Admitting: *Deleted

## 2019-06-08 ENCOUNTER — Other Ambulatory Visit: Payer: Self-pay | Admitting: *Deleted

## 2019-06-08 DIAGNOSIS — Z01818 Encounter for other preprocedural examination: Secondary | ICD-10-CM

## 2019-06-08 DIAGNOSIS — Z952 Presence of prosthetic heart valve: Secondary | ICD-10-CM

## 2019-06-12 ENCOUNTER — Telehealth: Payer: Self-pay | Admitting: Cardiovascular Disease

## 2019-06-12 DIAGNOSIS — S42225D 2-part nondisplaced fracture of surgical neck of left humerus, subsequent encounter for fracture with routine healing: Secondary | ICD-10-CM | POA: Diagnosis not present

## 2019-06-12 DIAGNOSIS — M25612 Stiffness of left shoulder, not elsewhere classified: Secondary | ICD-10-CM | POA: Diagnosis not present

## 2019-06-12 NOTE — Telephone Encounter (Signed)
LMsg for daughter to call back. Per Dr. Loletha Grayer patient needs echo and ekg prior to July 3.  See staff message from [place].

## 2019-06-14 ENCOUNTER — Other Ambulatory Visit: Payer: Self-pay

## 2019-06-14 ENCOUNTER — Ambulatory Visit (INDEPENDENT_AMBULATORY_CARE_PROVIDER_SITE_OTHER): Payer: Medicare Other | Admitting: *Deleted

## 2019-06-14 DIAGNOSIS — Z952 Presence of prosthetic heart valve: Secondary | ICD-10-CM

## 2019-06-14 NOTE — Progress Notes (Signed)
Patient in office for previsit EKG.  EKG done and reviewed by Dr Margaretann Loveless NSR

## 2019-06-15 DIAGNOSIS — M25612 Stiffness of left shoulder, not elsewhere classified: Secondary | ICD-10-CM | POA: Diagnosis not present

## 2019-06-15 DIAGNOSIS — S42225D 2-part nondisplaced fracture of surgical neck of left humerus, subsequent encounter for fracture with routine healing: Secondary | ICD-10-CM | POA: Diagnosis not present

## 2019-06-19 DIAGNOSIS — S42225D 2-part nondisplaced fracture of surgical neck of left humerus, subsequent encounter for fracture with routine healing: Secondary | ICD-10-CM | POA: Diagnosis not present

## 2019-06-19 DIAGNOSIS — M25612 Stiffness of left shoulder, not elsewhere classified: Secondary | ICD-10-CM | POA: Diagnosis not present

## 2019-06-20 ENCOUNTER — Telehealth (HOSPITAL_COMMUNITY): Payer: Self-pay | Admitting: *Deleted

## 2019-06-20 NOTE — Telephone Encounter (Signed)

## 2019-06-21 ENCOUNTER — Other Ambulatory Visit: Payer: Self-pay

## 2019-06-21 ENCOUNTER — Ambulatory Visit (HOSPITAL_COMMUNITY): Payer: Medicare Other | Attending: Cardiology

## 2019-06-21 ENCOUNTER — Telehealth: Payer: Medicare Other | Admitting: Cardiology

## 2019-06-21 DIAGNOSIS — Z01818 Encounter for other preprocedural examination: Secondary | ICD-10-CM | POA: Insufficient documentation

## 2019-06-21 DIAGNOSIS — S42225D 2-part nondisplaced fracture of surgical neck of left humerus, subsequent encounter for fracture with routine healing: Secondary | ICD-10-CM | POA: Diagnosis not present

## 2019-06-21 DIAGNOSIS — Z952 Presence of prosthetic heart valve: Secondary | ICD-10-CM

## 2019-06-21 DIAGNOSIS — M25612 Stiffness of left shoulder, not elsewhere classified: Secondary | ICD-10-CM | POA: Diagnosis not present

## 2019-06-22 ENCOUNTER — Telehealth: Payer: Self-pay

## 2019-06-22 ENCOUNTER — Other Ambulatory Visit (INDEPENDENT_AMBULATORY_CARE_PROVIDER_SITE_OTHER): Payer: Medicare Other

## 2019-06-22 ENCOUNTER — Ambulatory Visit (INDEPENDENT_AMBULATORY_CARE_PROVIDER_SITE_OTHER): Payer: Medicare Other | Admitting: Gastroenterology

## 2019-06-22 ENCOUNTER — Encounter: Payer: Self-pay | Admitting: Gastroenterology

## 2019-06-22 VITALS — Ht 64.0 in

## 2019-06-22 DIAGNOSIS — R634 Abnormal weight loss: Secondary | ICD-10-CM

## 2019-06-22 DIAGNOSIS — R197 Diarrhea, unspecified: Secondary | ICD-10-CM

## 2019-06-22 LAB — C-REACTIVE PROTEIN: CRP: <1 mg/dL (ref 0.5–20.0)

## 2019-06-22 LAB — SEDIMENTATION RATE: Sed Rate: 30 mm/hr (ref 0–30)

## 2019-06-22 MED ORDER — NA SULFATE-K SULFATE-MG SULF 17.5-3.13-1.6 GM/177ML PO SOLN: 1.0000 | ORAL | 0 refills | Status: DC

## 2019-06-22 NOTE — Patient Instructions (Addendum)
I have recommended several stool and blood tests to evaluate your diarrhea.  I have also recommended a colonoscopy and an upper endoscopy for further evaluation.   Drink a lot of liquids that have water, salt, and sugar. Good choices are water mixed with juice, flavored soda, and soup broth. If you are drinking enough, your urine will be light yellow or almost clear.  Avoid high fat foods, as they can make diarrhea worse.  I also recommend that you avoid carbonated beverages and artificial sweeteners.  Dairy products (except yogurt) may be difficult to digest when you have diarrhea. I recommend that you temporarily avoid lactose-containing foods.   I recommend a trial of loperamide 4 mg initially, then 2 mg after each unformed stool for ?2 days, with a maximum of 16 mg/day.  If that is not working, you could try bismuth salicylate (Pepto-Bismol) 30 mL or two tablets every 30 minutes for eight doses. When taking Pepto-Bismol, your stools may turn black.   A daily probiotic may be very helpful.  Thank you for your patience with me and our technology today! Please stay home, safe, and healthy. I look forward to meeting you in person in the future.

## 2019-06-22 NOTE — Progress Notes (Addendum)
TELEHEALTH VISIT  Referring Provider: Unk Pinto, MD Primary Care Physician:  Lorin Glass, FNP   Tele-visit due to COVID-19 pandemic Patient requested visit virtually, consented to the virtual encounter via video enabled telemedicine application (Doxim[employer]) Contact made at: 09:30 06/22/19 Patient verified by name and date of birth Location of patient: Home Location provider: [place] [facility] office Names of persons participating: Me, patient, daughter Connie Alvarado),  Connie Alvarado Time spent on telehealth visit:  I discussed the limitations of evaluation and management by telemedicine. The patient expressed understanding and agreed to proceed.  Reason for Consultation:  Weight loss and diarrhea   IMPRESSION:  Diarrhea    - normal TSH 0.75 Unintentional 20 pound weight loss since death of husband 16-Mar-2019 Daily alcohol use  Diarrhea developed after the death of her husband, but there are alarm features with weight loss and nocturnal symptoms. A recent CT scan and blood work provided reassurance. The differential diagnosis of her chronic diarrhea includes: irritable bowel syndrome, IBD, celiac disease, missed infection (such as giardia), food intolerance, microscopic colitis, other functional GI disease especially given the temporal association with her husband's death. By history and recent CT scan, this is less likely to be obstruction.  Unlikely to be the abrupt onset of chronic pancreatitis although the patient is a risk at risk given her chronic regular alcohol use.  There is no suggestion by labs or imaging for advanced chronic liver disease.    PLAN: - Stool for GI pathogen panel, fecal calprotectin, fecal elastase  - Fecal calprotectin, ESR, and CRP to screen for IBD - Giardia testing - Avoid carbonated beverages, artificial sweeteners, and dairy - EGD with duodenal biopsies  - Colonoscopy with random biopsies and evaluation of the terminal ileum   HPI: Connie Alvarado is a 78 y.o. female referred by Dr. Evonnie Dawes. The history is obtained through the patient, her daughter, and review of her electronic health record.  Ongoing regular alcohol use. 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 Has had multiple physical problems since he passed.   Notes ongoing diarrhea having 3-8 watery, explosively unformed BM daily. Baseline bowel habits are once daily.  Has an appetite and her daughter notes that she eats three full meals daily. Now weighs 109 pounds. Baseline 125-130 pounds. Occasionally wakes from sleep with need to defecate. She had had accidents - associated with and without alcohol.  No blood or mucous.  No fever, chills, night sweats.   Tried to avoid wine, drinking Ensure. No significant improvement. Uses Imodium for severe symptoms.  Friend with diarrhea following the death of a loved one and was diagnosed with IBS.   She has had antibiotics after a cat bite in April. Denies a precipitating event, trauma, close contacts with similar symptoms, changes in diet, or recent travel.   Labs 05/02/19: TSH 0.75, CBC normal Stool studies including fecal fat and C. difficile were negative.  A CT of the abdomen and pelvis with contrast 05/14/2019 shows cardiomegaly a nonobstructing stone in the upper pole of the left kidney, sigmoid diverticulosis without diverticulitis, no acute intra-abdominal abnormality, and a 1.4 cm groundglass opacity at the right lung base and a 0.6 cm pulmonary nodule in the peripheral left lower lobe.  The radiologist recommended a noncontrasted chest CT in 6 months.  She thinks she has had a colonoscopy before. Does not remember when. It was in [place].  Daughter with gallstones. Mother with stomach ulcers. No known family history of colon cancer or polyps. No family history of  uterine/endometrial cancer, pancreatic cancer or gastric/stomach cancer.  Past Medical History:  Diagnosis Date  . Anxiety   . Depression     Past Surgical History:  Procedure Laterality Date  . [place]  . CARDIAC VALVE SURGERY  2016    Current Outpatient Medications  Medication Sig Dispense Refill  . atorvastatin (LIPITOR) 80 MG tablet Take 80 mg by mouth daily.    Marland Kitchen ibuprofen (ADVIL,MOTRIN) 800 MG tablet Take 3 x a day with food, avoid aleve with it, can take with tylenol 60 tablet 0  . LORazepam (ATIVAN) 0.5 MG tablet 1 tablet in the morning, 1/2 tablet at lunch and dinner PRN anxiety, daughter will keep 180 tablet 0  . sertraline (ZOLOFT) 100 MG tablet 1-1.5 pills daily for depression/anxiety 180 tablet 2  . traZODone (DESYREL) 50 MG tablet 1/2-1 tablet for sleep 30 tablet 2  . VITAMIN D PO Take 1 tablet by mouth daily.     No current facility-administered medications for this visit.     Allergies as of 06/22/2019  . (No Known Allergies)    Family History  Problem Relation Age of Onset  . Cancer Mother   . Hypertension Mother   . Hyperlipidemia Mother   . Parkinson's disease Father   . Kidney disease Father     Social History   Socioeconomic History  . Marital status: Married    Spouse name: Not on file  . Number of children: 1  . Years of education: Not on file  . Highest education level: Not on file  Occupational History  . Not on file  Social Needs  . Financial resource strain: Not on file  . Food insecurity    Worry: Not on file    Inability: Not on file  . Transportation needs    Medical: Not on file    Non-medical: Not on file  Tobacco Use  . Smoking status: Former Smoker    Types: Cigarettes    Quit date: 1980    Years since quitting: 40.5  . Smokeless tobacco: Never Used  Substance and Sexual Activity  . Alcohol use: Yes    Alcohol/week: 19.0 standard drinks    Types: 19 Glasses of wine per week  . Drug use: Never  .  Sexual activity: Not Currently  Lifestyle  . Physical activity    Days per week: Not on file    Minutes per session: Not on file  . Stress: Not on file  Relationships  . Social Herbalist on phone: Not on file    Gets together: Not on file    Attends religious service: Not on file    Active member of club or organization: Not on file    Attends meetings of clubs or organizations: Not on file    Relationship status: Not on file  . Intimate partner violence    Fear of current or ex partner: Not on file    Emotionally abused: Not on file    Physically abused: Not on file    Forced sexual activity: Not on file  Other Topics Concern  . Not on file  Social History Narrative  . Not on file    Review of Systems: ALL ROS discussed and all others negative except listed in HPI. No myalgias or arthralgias. No new rash or bruise. No dry eyes,  red eyes, itchy eyes. No urinary urgency, hesitancy, or hematuria.   Physical Exam: Complete physical exam not performed due to the limits inherent in a telehealth encounter.  General: Awake, alert, and oriented, and well communicative. In no acute distress.  HEENT: EOMI, non-icteric sclera, NCAT, MMM  Neck: Normal movement of head and neck  Pulm: No labored breathing, speaking in full sentences without conversational dyspnea  Derm: No apparent lesions or bruising in visible field  MS: Moves all visible extremities without noticeable abnormality  Psych: Pleasant, cooperative, normal speech, normal affect and normal insight Neuro: Alert and appropriate   Sherrice Creekmore L. Tarri Glenn, MD, MPH [facility] 06/22/2019, 8:24 AM

## 2019-06-22 NOTE — Telephone Encounter (Signed)
Call received from [facility]. Please remove as PCP. This is not their patient.

## 2019-06-25 DIAGNOSIS — M25612 Stiffness of left shoulder, not elsewhere classified: Secondary | ICD-10-CM | POA: Diagnosis not present

## 2019-06-25 DIAGNOSIS — S42225D 2-part nondisplaced fracture of surgical neck of left humerus, subsequent encounter for fracture with routine healing: Secondary | ICD-10-CM | POA: Diagnosis not present

## 2019-06-26 ENCOUNTER — Other Ambulatory Visit: Payer: Medicare Other

## 2019-06-26 ENCOUNTER — Telehealth: Payer: Self-pay | Admitting: Internal Medicine

## 2019-06-26 DIAGNOSIS — R197 Diarrhea, unspecified: Secondary | ICD-10-CM | POA: Diagnosis not present

## 2019-06-26 DIAGNOSIS — R634 Abnormal weight loss: Secondary | ICD-10-CM | POA: Diagnosis not present

## 2019-06-26 NOTE — Telephone Encounter (Signed)
The patient's daughter, per the patient's request, has been made aware of the results and verbalized her understanding.   Notes recorded by Sanda Klein, MD on 06/21/2019 at 5:27 PM EDT  Generally good echo report. Normal heart pumping function. The resistance across the valve is moderately increased. Not in the range that requires any treatment (and would not prevent planned shoulder surgery), but enough to warrant yearly echo monitoring. Getting older echo reports for comparison would be very valuable.  Will discuss in detail at upcoming appointment

## 2019-06-26 NOTE — Telephone Encounter (Signed)
Follow Up: ° ° ° °Returning your call from yesterday. °

## 2019-06-28 DIAGNOSIS — M25612 Stiffness of left shoulder, not elsewhere classified: Secondary | ICD-10-CM | POA: Diagnosis not present

## 2019-06-28 DIAGNOSIS — S42225D 2-part nondisplaced fracture of surgical neck of left humerus, subsequent encounter for fracture with routine healing: Secondary | ICD-10-CM | POA: Diagnosis not present

## 2019-06-28 LAB — CALPROTECTIN, FECAL: Calprotectin, Fecal: 679 ug/g — ABNORMAL HIGH (ref 0–120)

## 2019-07-03 ENCOUNTER — Telehealth (INDEPENDENT_AMBULATORY_CARE_PROVIDER_SITE_OTHER): Payer: Medicare Other | Admitting: Cardiovascular Disease

## 2019-07-03 ENCOUNTER — Encounter: Payer: Self-pay | Admitting: Cardiovascular Disease

## 2019-07-03 VITALS — BP 111/66 | HR 78 | Ht 63.0 in | Wt 111.0 lb

## 2019-07-03 DIAGNOSIS — E782 Mixed hyperlipidemia: Secondary | ICD-10-CM

## 2019-07-03 DIAGNOSIS — Z952 Presence of prosthetic heart valve: Secondary | ICD-10-CM

## 2019-07-03 NOTE — Progress Notes (Signed)
Virtual Visit via Video Note   This visit type was conducted due to national recommendations for restrictions regarding the COVID-19 Pandemic (e.g. social distancing) in an effort to limit this patient's exposure and mitigate transmission in our community.  Due to her co-morbid illnesses, this patient is at least at moderate risk for complications without adequate follow up.  This format is felt to be most appropriate for this patient at this time.  All issues noted in this document were discussed and addressed.  A limited physical exam was performed with this format.  Please refer to the patient's chart for her consent to telehealth for [facility].   Date:  07/03/2019   ID:  Connie Alvarado, DOB 1941-07-08, MRN [id number]  Patient Location: Home Provider Location: Office  PCP:  Lorin Glass, FNP  Cardiologist:  New / Konstantina Nachreiner Electrophysiologist:  None   Evaluation Performed:  New Patient Evaluation  Chief Complaint:  Aortic valve disease (s/p AVR, increased gradients)  History of Present Illness:    Connie Alvarado is a 78 y.o. female with history of aortic valve replacement with a stented bioprosthesis (initial aortic valve replacement in 1999, redo June 2016 [employer] stented bioprosthesis 23 mm valve), hypercholesterolemia, vitamin B12 deficiency, recently relocated here from [place], seen today to establish Cardiology follow-up.  She has no cardiac complaints and denies issues with angina, dyspnea or dizziness/syncope with physical activity.  Her husband of many years passed away earlier this year and she is still fully in the grieving process.  She has lost 20 pounds since the beginning of 2020.  She has had clear problems with depression and is taking appropriate medications.  She is also recently been troubled by diarrhea and is seeing a gastroenterology specialist.  She has a colonoscopy planned.  She has problems with shoulder pain and has been considering  shoulder surgery, but recently her symptoms have improved.  The patient specifically denies any chest pain at rest exertion, dyspnea at rest or with exertion, orthopnea, paroxysmal nocturnal dyspnea, syncope, palpitations, focal neurological deficits, intermittent claudication, lower extremity edema, unexplained weight gain, cough, hemoptysis or wheezing.  She has occasional problems with dizziness and a pattern strongly suggestive of orthostatic hypotension.  She has occasional problems with dizziness she has occasional problems with dizzinessOccasional dizziness, pattern highly suggestive of orthostatic hypotension.  Echo on June 25 shows a very slight increase in AVR gradients when compared with an echocardiogram performed on July 03, 2018 at [facility] in [place].  In 2019 the peak and mean gradients were 39 and 20 mmHg respectively, now the calculated gradients are 38 and 25 mmHg respectively.  The effective orifice area has decreased from 1.34 cm in 2019 to 1.18 cm on the current echo.  The patient does not have symptoms concerning for COVID-19 infection (fever, chills, cough, or new shortness of breath).    Past Medical History:  Diagnosis Date  . Anxiety   . Depression    Past Surgical History:  Procedure Laterality Date  . [place]  . CARDIAC VALVE SURGERY  2016     Current Meds  Medication Sig  . atorvastatin (LIPITOR) 80 MG tablet Take 80 mg by mouth daily.  Marland Kitchen LORazepam (ATIVAN) 0.5 MG tablet 1 tablet in the morning, 1/2 tablet at lunch and dinner PRN anxiety, daughter will keep  . sertraline (ZOLOFT) 100 MG tablet 1-1.5 pills daily for depression/anxiety  . traZODone (DESYREL) 50 MG tablet 1/2-1 tablet for sleep  .  VITAMIN D PO Take 1 tablet by mouth daily.     Allergies:   Patient has no known allergies.   Social History   Tobacco Use  . Smoking status: Former Smoker    Types: Cigarettes    Quit date: 1980    Years since  quitting: 40.5  . Smokeless tobacco: Never Used  Substance Use Topics  . Alcohol use: Yes    Alcohol/week: 19.0 standard drinks    Types: 19 Glasses of wine per week  . Drug use: Never     Family Hx: The patient's family history includes Cancer in her mother; Hyperlipidemia in her mother; Hypertension in her mother; Kidney disease in her father; Parkinson's disease in her father.  ROS:   Please see the history of present illness.     All other systems reviewed and are negative.   Prior CV studies:   The following studies were reviewed today:  06/21/2019 ECHO   1. The left ventricle has normal systolic function with an ejection fraction of 60-65%. The cavity size was normal. There is mildly increased left ventricular wall thickness. Left ventricular diastolic Doppler parameters are consistent with impaired  relaxation.  2. The right ventricle has normal systolic function. The cavity was normal. There is no increase in right ventricular wall thickness.  3. Left atrial size was mild-moderately dilated.  4. Mild calcification of the mitral valve leaflet. No evidence of mitral valve stenosis.  5. There is a bioprosthetic aortic valve, leaflets not well-visualized. No aortic insufficiency. Mean gradient is elevated at 25 mmHg.  6. The aortic root is normal in size and structure.  7. There is mild dilatation of the ascending aorta measuring 40 mm.  8. Normal IVC size. PA systolic pressure 26 mmHg.  Labs/Other Tests and Data Reviewed:    EKG:  An ECG dated 06/14/2019 was personally reviewed today and demonstrated:  NSR, normal tracing, no evidence of LVH  Recent Labs: 03/08/2019: Magnesium 1.9 05/02/2019: BUN 12; Creat 0.62; Hemoglobin 12.7; Platelets 186; Potassium 3.8; Sodium 143; TSH 0.75 05/07/2019: ALT 29   Recent Lipid Panel Lab Results  Component Value Date/Time   CHOL 182 03/08/2019 10:53 AM   TRIG 82 03/08/2019 10:53 AM   HDL 79 03/08/2019 10:53 AM   CHOLHDL 2.3  03/08/2019 10:53 AM   LDLCALC 86 03/08/2019 10:53 AM    Wt Readings from Last 3 Encounters:  07/03/19 111 lb (50.3 kg)  05/02/19 117 lb 9.6 oz (53.3 kg)  04/19/19 118 lb (53.5 kg)     Objective:    Vital Signs:  BP 111/66   Pulse 78   Ht 5\' 3"  (1.6 m)   Wt 111 lb (50.3 kg)   BMI 19.66 kg/m    VITAL SIGNS:  reviewed GEN:  no acute distress EYES:  sclerae anicteric, EOMI - Extraocular Movements Intact RESPIRATORY:  normal respiratory effort, symmetric expansion CARDIOVASCULAR:  no peripheral edema SKIN:  no rash, lesions or ulcers. MUSCULOSKELETAL:  no obvious deformities. NEURO:  alert and oriented x 3, no obvious focal deficit PSYCH:  normal affect  ASSESSMENT & PLAN:    1. AS s/p (redo) AVR: moderately increased, but stable gradients, early peaking jet. Asymptomatic. No need for intervention, but may be candidate for valve in valve TAVR if she develops symptomatic aortic stenosis.  Plan yearly echo, sooner if she develops symptoms.  2. Hyperlipidemia: It sounds like she is never had issues with coronary disease.  I do not have the records but I am  sure she underwent cardiac catheterization before her 2016 redo aortic valve surgery.  Target LDL less than 100.  3. Weight loss: I am sure this is a large part related to grief and depression.  Discussed minimum caloric intake and ways to improve her weight with eating highly nutritious protein rich foods.  She is seeing her gastroenterologist to explore other possible causes for weight loss.  4. Orthostatic hypotension: In turn this is probably related to weight loss and reduced intake.  Also discussed the natural diuretic effects of coffee, tea, alcohol.  Need to rehydrate with other fluids as well.  COVID-19 Education: The signs and symptoms of COVID-19 were discussed with the patient and how to seek care for testing (follow up with PCP or arrange E-visit).  The importance of social distancing was discussed today.  Time:    Today, I have spent 24 minutes with the patient with telehealth technology discussing the above problems.     Medication Adjustments/Labs and Tests Ordered: Current medicines are reviewed at length with the patient today.  Concerns regarding medicines are outlined above.   Tests Ordered: No orders of the defined types were placed in this encounter.   Medication Changes: No orders of the defined types were placed in this encounter.   Follow Up:  Virtual Visit or In Person 12 months  Signed, Sanda Klein, MD  07/03/2019 8:57 AM    [facility]

## 2019-07-03 NOTE — Patient Instructions (Signed)
Medication Instructions:  No changes If you need a refill on your cardiac medications before your next appointment, please call your pharmacy.   Lab work: None ordered  Testing/Procedures: Your physician has requested that you have an echocardiogram in one year. Echocardiography is a painless test that uses sound waves to create images of your heart. It provides your doctor with information about the size and shape of your heart and how well your heart's chambers and valves are working. You may receive an ultrasound enhancing agent through an IV if needed to better visualize your heart during the echo.This procedure takes approximately one hour. There are no restrictions for this procedure. This will take place at the [address], [address].     Follow-Up: At [facility], you and your health needs are our priority.  As part of our continuing mission to provide you with exceptional heart care, we have created designated Provider Care Teams.  These Care Teams include your primary Cardiologist (physician) and Advanced Practice Providers (APPs -  Physician Assistants and Nurse Practitioners) who all work together to provide you with the care you need, when you need it. You will need a follow up appointment in 12 months after the ECHO (in one year)Please call our office 2 months in advance to schedule this appointment.  You may see Sanda Klein, MD or one of the following Advanced Practice Providers on your designated Care Team: [place], [place] . Fabian Sharp, PA-C

## 2019-07-04 DIAGNOSIS — S42225D 2-part nondisplaced fracture of surgical neck of left humerus, subsequent encounter for fracture with routine healing: Secondary | ICD-10-CM | POA: Diagnosis not present

## 2019-07-04 DIAGNOSIS — M25612 Stiffness of left shoulder, not elsewhere classified: Secondary | ICD-10-CM | POA: Diagnosis not present

## 2019-07-04 LAB — GASTROINTESTINAL PATHOGEN PANEL PCR
C. difficile Tox A/B, PCR: NOT DETECTED
Campylobacter, PCR: NOT DETECTED
Cryptosporidium, PCR: NOT DETECTED
E coli (ETEC) LT/ST PCR: NOT DETECTED
E coli (STEC) stx1/stx2, PCR: NOT DETECTED
E coli 0157, PCR: NOT DETECTED
Giardia lamblia, PCR: NOT DETECTED
Norovirus, PCR: NOT DETECTED
Rotavirus A, PCR: NOT DETECTED
Salmonella, PCR: NOT DETECTED
Shigella, PCR: NOT DETECTED

## 2019-07-04 LAB — EXTRA SPECIMEN

## 2019-07-04 LAB — GIARDIA ANTIGEN
MICRO NUMBER:: 621783
RESULT:: NOT DETECTED
SPECIMEN QUALITY:: ADEQUATE

## 2019-07-04 LAB — PANCREATIC ELASTASE, FECAL: Pancreatic Elastase-1, Stool: 152 mcg/g — ABNORMAL LOW

## 2019-07-06 ENCOUNTER — Encounter: Payer: Self-pay | Admitting: Gastroenterology

## 2019-07-06 ENCOUNTER — Ambulatory Visit (AMBULATORY_SURGERY_CENTER): Payer: Medicare Other | Admitting: Gastroenterology

## 2019-07-06 ENCOUNTER — Telehealth: Payer: Self-pay | Admitting: *Deleted

## 2019-07-06 ENCOUNTER — Other Ambulatory Visit: Payer: Self-pay

## 2019-07-06 VITALS — BP 119/60 | HR 56 | Temp 97.7°F | Resp 19 | Ht 64.0 in | Wt 109.0 lb

## 2019-07-06 DIAGNOSIS — K5730 Diverticulosis of large intestine without perforation or abscess without bleeding: Secondary | ICD-10-CM

## 2019-07-06 DIAGNOSIS — K648 Other hemorrhoids: Secondary | ICD-10-CM | POA: Diagnosis not present

## 2019-07-06 DIAGNOSIS — K208 Other esophagitis: Secondary | ICD-10-CM | POA: Diagnosis not present

## 2019-07-06 DIAGNOSIS — R197 Diarrhea, unspecified: Secondary | ICD-10-CM | POA: Diagnosis not present

## 2019-07-06 DIAGNOSIS — K2970 Gastritis, unspecified, without bleeding: Secondary | ICD-10-CM | POA: Diagnosis not present

## 2019-07-06 DIAGNOSIS — K635 Polyp of colon: Secondary | ICD-10-CM | POA: Diagnosis not present

## 2019-07-06 DIAGNOSIS — K3189 Other diseases of stomach and duodenum: Secondary | ICD-10-CM | POA: Diagnosis not present

## 2019-07-06 DIAGNOSIS — K2950 Unspecified chronic gastritis without bleeding: Secondary | ICD-10-CM | POA: Diagnosis not present

## 2019-07-06 DIAGNOSIS — D125 Benign neoplasm of sigmoid colon: Secondary | ICD-10-CM

## 2019-07-06 DIAGNOSIS — K219 Gastro-esophageal reflux disease without esophagitis: Secondary | ICD-10-CM

## 2019-07-06 DIAGNOSIS — K529 Noninfective gastroenteritis and colitis, unspecified: Secondary | ICD-10-CM | POA: Diagnosis not present

## 2019-07-06 DIAGNOSIS — K52832 Lymphocytic colitis: Secondary | ICD-10-CM

## 2019-07-06 DIAGNOSIS — R634 Abnormal weight loss: Secondary | ICD-10-CM | POA: Diagnosis not present

## 2019-07-06 MED ORDER — SODIUM CHLORIDE 0.9 % IV SOLN: 500.0000 mL | Freq: Once | INTRAVENOUS | Status: DC

## 2019-07-06 MED ORDER — CREON 24000-76000 UNITS PO CPEP: ORAL_CAPSULE | ORAL | 1 refills | Status: DC

## 2019-07-06 NOTE — Progress Notes (Signed)
Called to room to assist during endoscopic procedure.  Patient ID and intended procedure confirmed with present staff. Received instructions for my participation in the procedure from the performing physician.  

## 2019-07-06 NOTE — Patient Instructions (Signed)
Several biopsies taken today.  Follow-up encounter to review these results and make additional recommendations.  Dr Payton Emerald wants to see you in her office on 3rd floor on Thursday July 16TH AT 3:45PM.  Her office number is [phone]. One colon polyp was removed.  Please read the polyp handout. Please Note:  You might notice some irritation and congestion in your nose or some drainage.  This is from the oxygen used during your procedure.  THERE IS NO NEED FOR CONCERN AND IT SHOULD CLEAR UP IN A DAY OR SO. Resume regular diet today.  Please read the high fiber diet handout. Consider pancreatic enzymes for treatment of diarrhea if symptoms worsen.    YOU HAD AN ENDOSCOPIC PROCEDURE TODAY AT [facility]:   Refer to the procedure report that was given to you for any specific questions about what was found during the examination.  If the procedure report does not answer your questions, please call your gastroenterologist to clarify.  If you requested that your care partner not be given the details of your procedure findings, then the procedure report has been included in a sealed envelope for you to review at your convenience later.  YOU SHOULD EXPECT: Some feelings of bloating in the abdomen. Passage of more gas than usual.  Walking can help get rid of the air that was put into your GI tract during the procedure and reduce the bloating. If you had a lower endoscopy (such as a colonoscopy or flexible sigmoidoscopy) you may notice spotting of blood in your stool or on the toilet paper. If you underwent a bowel prep for your procedure, you may not have a normal bowel movement for a few days.  Please Note:  You might notice some irritation and congestion in your nose or some drainage.  This is from the oxygen used during your procedure.  There is no need for concern and it should clear up in a day or so.  SYMPTOMS TO REPORT IMMEDIATELY:   Following lower endoscopy (colonoscopy or  flexible sigmoidoscopy):  Excessive amounts of blood in the stool  Significant tenderness or worsening of abdominal pains  Swelling of the abdomen that is new, acute  Fever of 100F or higher   Following upper endoscopy (EGD)  Vomiting of blood or coffee ground material  New chest pain or pain under the shoulder blades  Painful or persistently difficult swallowing  New shortness of breath  Fever of 100F or higher  Black, tarry-looking stools  For urgent or emergent issues, a gastroenterologist can be reached at any hour by calling [phone].   DIET:  We do recommend a small meal at first, but then you may proceed to your regular diet.  Drink plenty of fluids but you should avoid alcoholic beverages for 24 hours.  ACTIVITY:  You should plan to take it easy for the rest of today and you should NOT DRIVE or use heavy machinery until tomorrow (because of the sedation medicines used during the test).    FOLLOW UP: Our staff will call the number listed on your records 48-72 hours following your procedure to check on you and address any questions or concerns that you may have regarding the information given to you following your procedure. If we do not reach you, we will leave a message.  We will attempt to reach you two times.  During this call, we will ask if you have developed any symptoms of COVID 19. If you develop any symptoms (  ie: fever, flu-like symptoms, shortness of breath, cough etc.) before then, please call [phone].  If you test positive for Covid 19 in the 2 weeks post procedure, please call and report this information to [place].    If any biopsies were taken you will be contacted by phone or by letter within the next 1-3 weeks.  Please call us at [phone] if you have not heard about the biopsies in 3 weeks.    SIGNATURES/CONFIDENTIALITY: You and/or your care partner have signed paperwork which will be entered into your electronic medical record.  These signatures  attest to the fact that that the information above on your After Visit Summary has been reviewed and is understood.  Full responsibility of the confidentiality of this discharge information lies with you and/or your care-partner.

## 2019-07-06 NOTE — Progress Notes (Signed)
Temperature taken by Izora Gala, LPN, VS taken by [facility], [place]

## 2019-07-06 NOTE — Op Note (Signed)
[place] Patient Name: Connie Alvarado Procedure Date: 07/06/2019 9:35 AM MRN: [id number] Endoscopist: Thornton Park MD, MD Age: 78 Referring MD:  Date of Birth: [birth year] Gender: Female Account #: [id number] Procedure:                Upper GI endoscopy Indications:              Diarrhea                           Unintentional 20 pound weight loss since death of                            husband 02/21/19 Medicines:                See the Anesthesia note for documentation of the                            administered medications Procedure:                Pre-Anesthesia Assessment:                           - Prior to the procedure, a History and Physical                            was performed, and patient medications and                            allergies were reviewed. The patient's tolerance of                            previous anesthesia was also reviewed. The risks                            and benefits of the procedure and the sedation                            options and risks were discussed with the patient.                            All questions were answered, and informed consent                            was obtained. Prior Anticoagulants: The patient has                            taken no previous anticoagulant or antiplatelet                            agents. ASA Grade Assessment: II - A patient with                            mild systemic disease. After reviewing the risks  and benefits, the patient was deemed in                            satisfactory condition to undergo the procedure.                           After obtaining informed consent, the endoscope was                            passed under direct vision. Throughout the                            procedure, the patient's blood pressure, pulse, and                            oxygen saturations were monitored continuously. The   Endoscope was introduced through the mouth, and                            advanced to the third part of duodenum. The upper                            GI endoscopy was accomplished without difficulty.                            The patient tolerated the procedure well. Scope In: Scope Out: Findings:                 The examined esophagus was normal. Biopsies were                            taken with a cold forceps for histology.                           Localized moderate inflammation was found in the                            gastric fundus and in the gastric body. Biopsies                            were taken with a cold forceps for histology.                           A small hiatal hernia was present.                           The examined duodenum was normal. Biopsies were                            taken with a cold forceps for histology.                           The cardia and gastric fundus were normal on  retroflexion.                           The exam was otherwise without abnormality. Complications:            No immediate complications. Estimated blood loss:                            Minimal. Estimated Blood Loss:     Estimated blood loss was minimal. Impression:               - Normal esophagus. Biopsied.                           - Gastritis. Biopsied.                           - Small hiatal hernia.                           - Normal examined duodenum. Biopsied.                           - The examination was otherwise normal. No obvious                            source of diarrhea or weight loss identified.                            Awaiting esophageal, gastric, and duodenal biopsies. Recommendation:           - Patient has a contact number available for                            emergencies. The signs and symptoms of potential                            delayed complications were discussed with the                            patient.  Return to normal activities tomorrow.                            Written discharge instructions were provided to the                            patient.                           - Resume regular diet today.                           - Continue present medications.                           - Await pathology results.                           -  Proceed with colonoscopy today as previously                            planned. Thornton Park MD, MD 07/06/2019 10:14:30 AM This report has been signed electronically.

## 2019-07-06 NOTE — Op Note (Signed)
[place] Patient Name: Connie Alvarado Procedure Date: 07/06/2019 9:35 AM MRN: [id number] Endoscopist: Thornton Park MD, MD Age: 78 Referring MD:  Date of Birth: 1941/06/09 Gender: Female Account #: [id number] Procedure:                Colonoscopy Indications:              Clinically significant diarrhea of unexplained                            origin, Unintentional 20 pound weight loss since                            death of husband 25-Feb-2019                           Patient reports some improvement in diarrhea since                            the discontinuation of Lexapro.                           Distant colonoscopy. Date and findings unknown.                           No known family history of colon cancer or polyps. Medicines:                See the Anesthesia note for documentation of the                            administered medications Procedure:                Pre-Anesthesia Assessment:                           - Prior to the procedure, a History and Physical                            was performed, and patient medications and                            allergies were reviewed. The patient's tolerance of                            previous anesthesia was also reviewed. The risks                            and benefits of the procedure and the sedation                            options and risks were discussed with the patient.                            All questions were answered, and informed consent  was obtained. Prior Anticoagulants: The patient has                            taken no previous anticoagulant or antiplatelet                            agents. ASA Grade Assessment: II - A patient with                            mild systemic disease. After reviewing the risks                            and benefits, the patient was deemed in                            satisfactory condition to undergo the procedure.                   After obtaining informed consent, the colonoscope                            was passed under direct vision. Throughout the                            procedure, the patient's blood pressure, pulse, and                            oxygen saturations were monitored continuously. The                            Colonoscope was introduced through the anus and                            advanced to the the terminal ileum, with                            identification of the appendiceal orifice and IC                            valve. A second forward view of the right colon was                            performed. The colonoscopy was performed without                            difficulty. The patient tolerated the procedure                            well. The quality of the bowel preparation was                            good. The terminal ileum, ileocecal valve,  appendiceal orifice, and rectum were photographed. Scope In: 9:51:19 AM Scope Out: 10:06:47 AM Scope Withdrawal Time: 0 hours 11 minutes 18 seconds  Total Procedure Duration: 0 hours 15 minutes 28 seconds  Findings:                 The perianal and digital rectal examinations were                            normal. Small internal hemorrhoids are present.                           A few small and large-mouthed diverticula were                            found in the sigmoid colon and descending colon.                           A 3 mm polyp was found in the distal sigmoid colon.                            The polyp was flat. The polyp was removed with a                            cold snare. Resection and retrieval were complete.                            Estimated blood loss was minimal.                           The colon (entire examined portion) appeared                            normal. Biopsies for histology were taken with a                            cold forceps from the entire  colon for evaluation                            of microscopic colitis. Estimated blood loss was                            minimal.                           The terminal ileum appeared normal. Biopsies were                            taken with a cold forceps for histology.                           The exam was otherwise without abnormality on                            direct and retroflexion views. Complications:  No immediate complications. Estimated blood loss:                            Minimal. Estimated Blood Loss:     Estimated blood loss was minimal. Impression:               - Diverticulosis in the sigmoid colon and in the                            descending colon.                           - One 3 mm polyp in the distal sigmoid colon,                            removed with a cold snare. Resected and retrieved.                           - The entire examined colon is normal. Biopsied.                           - The examined portion of the ileum was normal.                            Biopsied.                           - The examination was otherwise normal on direct                            and retroflexion views.                           - No obvious source for recent symptoms identified                            on this examination. Awaiting colon and terminal                            ileum biopsies. Recommendation:           - Patient has a contact number available for                            emergencies. The signs and symptoms of potential                            delayed complications were discussed with the                            patient. Return to normal activities tomorrow.                            Written discharge instructions were provided to the  patient.                           - Resume regular diet today. High fiber diet                            encouraged.                           - Continue  present medications.                           - Await pathology results.                           - Follow-up encounter to review these results and                            make additional recommendations.                           - Consider pancreatic enzymes for treatment of                            diarrhea is symptoms worsen. Thornton Park MD, MD 07/06/2019 10:20:52 AM This report has been signed electronically.

## 2019-07-06 NOTE — Progress Notes (Signed)
PT taken to PACU. Monitors in place. VSS. Report given to RN. 

## 2019-07-06 NOTE — Telephone Encounter (Signed)
Thornton Park, MD  Dalene Seltzer, RN        Prior to her colonoscopy, she said her diarrhea was better. After the colonoscopy, she asked for treatment because the diarrhea is not better. I recommend a trial of Creon-24,000 lipase, one to two capsules with meals and one capsule with a snack. #250 with 1 refill. We will be able to monitor her response to treatment with her appointment next week. Thanks.    Orders placed in Epic for creon, electronically sent to pharmacy.

## 2019-07-09 ENCOUNTER — Telehealth: Payer: Self-pay | Admitting: *Deleted

## 2019-07-09 NOTE — Telephone Encounter (Signed)
Spoke to the patient's daughter after calling the pharmacy to let her know the Creon was ready for pick-up. Nothing further at the time of the call.

## 2019-07-10 ENCOUNTER — Telehealth: Payer: Self-pay | Admitting: *Deleted

## 2019-07-10 DIAGNOSIS — S42225D 2-part nondisplaced fracture of surgical neck of left humerus, subsequent encounter for fracture with routine healing: Secondary | ICD-10-CM | POA: Diagnosis not present

## 2019-07-10 DIAGNOSIS — M25612 Stiffness of left shoulder, not elsewhere classified: Secondary | ICD-10-CM | POA: Diagnosis not present

## 2019-07-10 NOTE — Telephone Encounter (Signed)
First follow up call attempt.  No answer.

## 2019-07-10 NOTE — Telephone Encounter (Signed)
  Follow up Call-  Call back number 07/06/2019  Post procedure Call Back phone  # [phone]  Permission to leave phone message Yes    LMOM to call back with any questions or concerns; also if she has developed any fever or respiratory issues since her procedure

## 2019-07-11 ENCOUNTER — Telehealth: Payer: Self-pay | Admitting: Gastroenterology

## 2019-07-11 DIAGNOSIS — M25612 Stiffness of left shoulder, not elsewhere classified: Secondary | ICD-10-CM | POA: Diagnosis not present

## 2019-07-11 NOTE — Telephone Encounter (Signed)
Spoke to patients daughter and moved appt out to next week to await pathology results. She states her mom is doing fine.

## 2019-07-11 NOTE — Telephone Encounter (Signed)
Her path results are back! Please see if there is availability for [place] to review them tomorrow. Thank you.

## 2019-07-11 NOTE — Telephone Encounter (Signed)
Patient daughter returned call ?

## 2019-07-12 ENCOUNTER — Ambulatory Visit: Payer: Medicare Other | Admitting: Gastroenterology

## 2019-07-12 ENCOUNTER — Ambulatory Visit (INDEPENDENT_AMBULATORY_CARE_PROVIDER_SITE_OTHER): Payer: Medicare Other | Admitting: Gastroenterology

## 2019-07-12 DIAGNOSIS — K52832 Lymphocytic colitis: Secondary | ICD-10-CM | POA: Diagnosis not present

## 2019-07-12 MED ORDER — BUDESONIDE 3 MG PO CPEP: 9.0000 mg | ORAL_CAPSULE | Freq: Every day | ORAL | 1 refills | Status: DC

## 2019-07-12 NOTE — Progress Notes (Signed)
TELEHEALTH VISIT  Referring Provider: Lorin Glass, FNP Primary Care Physician:  Lorin Glass, FNP   Tele-visit due to COVID-19 pandemic Patient requested visit virtually, consented to the virtual encounter via video enabled telemedicine application (Zoom) Contact made at: 07/12/19 14:00 Patient verified by name and date of birth Location of patient: Home Location provider: [facility] Names of persons participating: Me, patient, daughter Neita Carp),  [place] Time spent on telehealth visit: 28 minutes I discussed the limitations of evaluation and management by telemedicine. The patient expressed understanding and agreed to proceed.  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  Will start budesonide 9 mg daily for at least 8 weeks.  If in clinical remission in 8 weeks (having at less than 3 stools daily and no watery stools) we will gradually taper the budesonide by 3 mg every 2 weeks.  If not in clinical remission at 8 weeks,  we will continue budesonide 9 mg for at least 12 weeks before tapering the dose.   PLAN: Start budesonide 9 mg daily for eight weeks with goal to taper when symptoms are well controlled Loperamide recommended PRN for ongoing diarrhea Avoid all NSAIDs as this may flare lymphocytic colitis Follow-up in 6 to 8 weeks, or earlier as  needed, to monitor response to therapy Consider surveillance colonoscopy in 5 to 7 years if clinically appropriate at that time  Please see the "Patient Instructions" section for addition details about the plan.  HPI: Connie Alvarado is a 78 y.o. female under evaluation for diarrhea that initially developed after the death of her husband 03/14/2019. Initial virtual consultation was performed 06/22/19.  The patient had an EGD and colonoscopy 07/06/19. She is seen today as a virtual visit.  The Interval history is obtained through the patient, her daughter, and review of her electronic health record.  Ongoing regular alcohol use. 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  Labs from 06/22/2019 show a CRP less than 1 and a sedimentation rate of 30. Stool studies 06/26/2019 showed a normal GI pathogen panel.  Fecal calprotectin was elevated at 679.  Pancreatic elastase 152.  Upper endoscopy 07/06/2019 showed gastritis, a small hiatal hernia, and was otherwise endoscopically normal.  Biopsies showed chronic gastritis without H. pylori, reflux, and normal duodenum.  Colonoscopy 07/06/2019 showed internal hemorrhoids, a 3 mm sigmoid tubular adenoma, and mild sigmoid and descending colon diverticulosis.  Random colon biopsies were consistent with lymphocytic colitis.  Before the pathology results were available a trial of Creon Creon-24,000 lipase, one to two capsules with meals and one capsule with a snack was prescribed but she did not pick it up due to the cost.  Diarrhea was improved prior to the colonoscopy but still persists. Having 1-2 BM daily. Now soft stool. No longer watery. Appetite is good. Eating well but continues to lose weight.  Weight today is 109. Weighed 125-130. But at her best health her weight can approach 140.   At it's worst she was having 3-8 watery, explosively unformed BM  daily. Baseline bowel habits are once daily.  No nocturnal symptoms. No systemic complaints.  She has been feeling well enough that she resumed drinking some wine.  Recent abdominal imaging: A CT of the abdomen and pelvis with contrast 05/14/2019 shows cardiomegaly a nonobstructing stone in the upper pole of the left kidney, sigmoid diverticulosis without diverticulitis, no acute intra-abdominal abnormality, and a 1.4 cm groundglass opacity at the right lung base and a 0.6 cm pulmonary nodule in the peripheral left lower lobe.  The radiologist recommended a noncontrasted chest CT in 6 months.  Prior endoscopy history: She thinks she has had a colonoscopy before. Does not remember when. It was in [place].    Past Medical History:  Diagnosis Date  . Anxiety   . Depression   . Hyperlipidemia   . Malaria    as a child    Past Surgical History:  Procedure Laterality Date  . [place]  . CARDIAC VALVE SURGERY  2016    Current Outpatient Medications  Medication Sig Dispense Refill  . atorvastatin (LIPITOR) 80 MG tablet Take 80 mg by mouth daily.    Marland Kitchen ibuprofen (ADVIL,MOTRIN) 800 MG tablet Take 3 x a day with food, avoid aleve with it, can take with tylenol (Patient not taking: Reported on 07/03/2019) 60 tablet 0  . LORazepam (ATIVAN) 0.5 MG tablet 1 tablet in the morning, 1/2 tablet at lunch and dinner PRN anxiety, daughter will keep 180 tablet 0  . Pancrelipase, Lip-Prot-Amyl, (CREON) 24000-76000 units CPEP Take 2 tablets with a meal and then 1 tablet with a snack. 250 capsule 1  . sertraline (ZOLOFT) 100 MG tablet 1-1.5 pills daily for depression/anxiety (Patient not taking: Reported on 07/06/2019) 180 tablet 2  . traZODone (DESYREL) 50 MG tablet 1/2-1 tablet for sleep (Patient not taking: Reported on 07/06/2019) 30 tablet 2  . VITAMIN D PO Take 1 tablet by mouth daily.     No current facility-administered medications for this visit.     Allergies as of 07/12/2019   . (No Known Allergies)    Family History  Problem Relation Age of Onset  . Cancer Mother   . Hypertension Mother   . Hyperlipidemia Mother   . Parkinson's disease Father   . Kidney disease Father   . Colon cancer Neg Hx   . Esophageal cancer Neg Hx   . Rectal cancer Neg Hx   . Stomach cancer Neg Hx     Social History   Socioeconomic History  . Marital status: Married    Spouse name: Not on file  . Number of children: 1  . Years of education: Not on file  . Highest education level: Not on file  Occupational History  . Not on file  Social Needs  . Financial resource strain: Not on file  . Food insecurity    Worry: Not on file    Inability: Not on file  . Transportation needs    Medical: Not on file    Non-medical: Not on file  Tobacco Use  . Smoking status: Former Smoker    Types: Cigarettes    Quit date: 1980    Years since quitting: 40.5  . Smokeless tobacco: Never Used  Substance and Sexual Activity  . Alcohol use: Yes    Alcohol/week: 14.0 standard drinks  Types: 14 Glasses of wine per week  . Drug use: Never  . Sexual activity: Not Currently  Lifestyle  . Physical activity    Days per week: Not on file    Minutes per session: Not on file  . Stress: Not on file  Relationships  . Social Herbalist on phone: Not on file    Gets together: Not on file    Attends religious service: Not on file    Active member of club or organization: Not on file    Attends meetings of clubs or organizations: Not on file    Relationship status: Not on file  . Intimate partner violence    Fear of current or ex partner: Not on file    Emotionally abused: Not on file    Physically abused: Not on file    Forced sexual activity: Not on file  Other Topics Concern  . Not on file  Social History Narrative  . Not on file   Physical Exam: Complete physical exam not performed due to the limits inherent in a telehealth encounter.  General: Awake, alert, and  oriented, and well communicative. In no acute distress.  HEENT: EOMI, non-icteric sclera, NCAT, MMM  Neck: Normal movement of head and neck  Pulm: No labored breathing, speaking in full sentences without conversational dyspnea  Derm: No apparent lesions or bruising in visible field  MS: Moves all visible extremities without noticeable abnormality  Psych: Pleasant, cooperative, normal speech, normal affect and normal insight Neuro: Alert and appropriate   Connie Alvarado L. Tarri Glenn, MD, MPH [facility] 07/12/2019, 1:39 PM

## 2019-07-12 NOTE — Telephone Encounter (Signed)
Left message with patients daughter to see if they would like a virtual visit today.

## 2019-07-12 NOTE — Patient Instructions (Addendum)
Your recent colon biopsies show lymphocytic colitis, a form of microscopic colitis. I recommend that you go to the UpToDate for more information on lymphocytic colitis.  Drink plenty of water while you are having diarrhea. I don't want you to get dehydrated.   Avoid aspirin, ibuprofen, Aleve, Naproxen, Goody's, BCs, and other NSAIDs. Use of those medications can make the colitis worse.  I have recommended two months of budesonide 9 mg daily.   Use  Loperamide as needed for breakthrough diarrhea. Take 4 mg every morning. Take an additional 2 mg after each loose stool. Do not take more than 16 mg of loperamide in one day.  Let's plan on another virtual visit in 6-8 weeks, or earlier if needed, so that we can monitor your response to treatment.   The small polyp removed during your colonoscopy was a precancerous polyp known as a tubular adenoma.  Given these findings we should consider another colonoscopy in 5 to 7 years if it is clinically appropriate at that time.  Please call me with any questions or concerns in the meantime.

## 2019-07-15 ENCOUNTER — Encounter: Payer: Self-pay | Admitting: Gastroenterology

## 2019-07-16 DIAGNOSIS — M25612 Stiffness of left shoulder, not elsewhere classified: Secondary | ICD-10-CM | POA: Diagnosis not present

## 2019-07-16 DIAGNOSIS — S42225D 2-part nondisplaced fracture of surgical neck of left humerus, subsequent encounter for fracture with routine healing: Secondary | ICD-10-CM | POA: Diagnosis not present

## 2019-07-19 ENCOUNTER — Ambulatory Visit: Payer: Medicare Other | Admitting: Gastroenterology

## 2019-07-23 MED ORDER — ATORVASTATIN CALCIUM 80 MG PO TABS: 80.0000 mg | ORAL_TABLET | Freq: Every day | ORAL | 1 refills | Status: DC

## 2019-07-24 DIAGNOSIS — M25612 Stiffness of left shoulder, not elsewhere classified: Secondary | ICD-10-CM | POA: Diagnosis not present

## 2019-07-24 DIAGNOSIS — S42225D 2-part nondisplaced fracture of surgical neck of left humerus, subsequent encounter for fracture with routine healing: Secondary | ICD-10-CM | POA: Diagnosis not present

## 2019-07-26 DIAGNOSIS — S42225D 2-part nondisplaced fracture of surgical neck of left humerus, subsequent encounter for fracture with routine healing: Secondary | ICD-10-CM | POA: Diagnosis not present

## 2019-07-26 DIAGNOSIS — M25612 Stiffness of left shoulder, not elsewhere classified: Secondary | ICD-10-CM | POA: Diagnosis not present

## 2019-07-30 DIAGNOSIS — M25612 Stiffness of left shoulder, not elsewhere classified: Secondary | ICD-10-CM | POA: Diagnosis not present

## 2019-07-30 DIAGNOSIS — S42225D 2-part nondisplaced fracture of surgical neck of left humerus, subsequent encounter for fracture with routine healing: Secondary | ICD-10-CM | POA: Diagnosis not present

## 2019-08-01 DIAGNOSIS — M25612 Stiffness of left shoulder, not elsewhere classified: Secondary | ICD-10-CM | POA: Diagnosis not present

## 2019-08-01 DIAGNOSIS — S42225D 2-part nondisplaced fracture of surgical neck of left humerus, subsequent encounter for fracture with routine healing: Secondary | ICD-10-CM | POA: Diagnosis not present

## 2019-08-03 ENCOUNTER — Encounter: Payer: Medicare Other | Admitting: Gastroenterology

## 2019-08-03 ENCOUNTER — Encounter

## 2019-08-07 DIAGNOSIS — S42225D 2-part nondisplaced fracture of surgical neck of left humerus, subsequent encounter for fracture with routine healing: Secondary | ICD-10-CM | POA: Diagnosis not present

## 2019-08-07 DIAGNOSIS — M25612 Stiffness of left shoulder, not elsewhere classified: Secondary | ICD-10-CM | POA: Diagnosis not present

## 2019-08-08 ENCOUNTER — Ambulatory Visit: Payer: Medicare Other | Admitting: Physician Assistant

## 2019-08-14 ENCOUNTER — Encounter: Payer: Self-pay | Admitting: *Deleted

## 2019-08-14 ENCOUNTER — Telehealth: Payer: Self-pay | Admitting: *Deleted

## 2019-08-14 DIAGNOSIS — S42225D 2-part nondisplaced fracture of surgical neck of left humerus, subsequent encounter for fracture with routine healing: Secondary | ICD-10-CM | POA: Diagnosis not present

## 2019-08-14 DIAGNOSIS — M25612 Stiffness of left shoulder, not elsewhere classified: Secondary | ICD-10-CM | POA: Diagnosis not present

## 2019-08-14 NOTE — Telephone Encounter (Signed)
Spoke to the patient and her daughter. Daughter sent patient message because the patient had an episode of urgency and fecal incontinence. The patient is taking loperamide PRN, newly started Celexa which they think is contributing to the intermittent diarrhea. The patient states she does not have daily diarrhea, that it comes and goes and the patient was unaware that the daughter sent to message. Eating and drinking normally. Denies blood or mucous in the stool. The episode of urgency and fecal incontinence is what precipitated the call. Patient scheduled for f/u on 9/14 at 3:00 pm. Please advise.

## 2019-08-14 NOTE — Telephone Encounter (Signed)
Spoke to the patient's daughter, verbalized understanding. Patient daughter, [name] requested a mychart message be sent. This RN sent Dr. Tarri Glenn recommendations via Deloris Ping. No other concerns or questions voiced by the end of the call.

## 2019-08-14 NOTE — Telephone Encounter (Signed)
Did she start the budesonide last month as discussed at the time of her encounter?

## 2019-08-14 NOTE — Telephone Encounter (Signed)
Correct, she has been taking the Budesonide as prescribed and never picked up the Creon.

## 2019-08-14 NOTE — Telephone Encounter (Signed)
I recommend that she add a daily stool bulking agent such as Metamucil in addition to her current regimen. Hopefully this will improve urgency and make incontinence less likely. Thank you.

## 2019-08-16 DIAGNOSIS — S42225D 2-part nondisplaced fracture of surgical neck of left humerus, subsequent encounter for fracture with routine healing: Secondary | ICD-10-CM | POA: Diagnosis not present

## 2019-08-16 DIAGNOSIS — M25612 Stiffness of left shoulder, not elsewhere classified: Secondary | ICD-10-CM | POA: Diagnosis not present

## 2019-08-21 DIAGNOSIS — S42225D 2-part nondisplaced fracture of surgical neck of left humerus, subsequent encounter for fracture with routine healing: Secondary | ICD-10-CM | POA: Diagnosis not present

## 2019-08-21 DIAGNOSIS — M25612 Stiffness of left shoulder, not elsewhere classified: Secondary | ICD-10-CM | POA: Diagnosis not present

## 2019-08-22 DIAGNOSIS — M25612 Stiffness of left shoulder, not elsewhere classified: Secondary | ICD-10-CM | POA: Diagnosis not present

## 2019-08-22 MED ORDER — ESCITALOPRAM OXALATE 10 MG PO TABS: 5.0000 mg | ORAL_TABLET | Freq: Every day | ORAL | 2 refills | Status: DC

## 2019-08-28 DIAGNOSIS — M25612 Stiffness of left shoulder, not elsewhere classified: Secondary | ICD-10-CM | POA: Diagnosis not present

## 2019-08-28 DIAGNOSIS — S42225D 2-part nondisplaced fracture of surgical neck of left humerus, subsequent encounter for fracture with routine healing: Secondary | ICD-10-CM | POA: Diagnosis not present

## 2019-08-30 DIAGNOSIS — M25612 Stiffness of left shoulder, not elsewhere classified: Secondary | ICD-10-CM | POA: Diagnosis not present

## 2019-08-30 DIAGNOSIS — S42225D 2-part nondisplaced fracture of surgical neck of left humerus, subsequent encounter for fracture with routine healing: Secondary | ICD-10-CM | POA: Diagnosis not present

## 2019-09-05 DIAGNOSIS — S42225D 2-part nondisplaced fracture of surgical neck of left humerus, subsequent encounter for fracture with routine healing: Secondary | ICD-10-CM | POA: Diagnosis not present

## 2019-09-05 DIAGNOSIS — M25612 Stiffness of left shoulder, not elsewhere classified: Secondary | ICD-10-CM | POA: Diagnosis not present

## 2019-09-06 ENCOUNTER — Other Ambulatory Visit: Payer: Self-pay | Admitting: Gastroenterology

## 2019-09-07 DIAGNOSIS — S42225D 2-part nondisplaced fracture of surgical neck of left humerus, subsequent encounter for fracture with routine healing: Secondary | ICD-10-CM | POA: Diagnosis not present

## 2019-09-07 DIAGNOSIS — M25612 Stiffness of left shoulder, not elsewhere classified: Secondary | ICD-10-CM | POA: Diagnosis not present

## 2019-09-10 ENCOUNTER — Ambulatory Visit (INDEPENDENT_AMBULATORY_CARE_PROVIDER_SITE_OTHER): Payer: Medicare Other | Admitting: Gastroenterology

## 2019-09-10 ENCOUNTER — Encounter: Payer: Self-pay | Admitting: Gastroenterology

## 2019-09-10 ENCOUNTER — Other Ambulatory Visit (INDEPENDENT_AMBULATORY_CARE_PROVIDER_SITE_OTHER): Payer: Medicare Other

## 2019-09-10 VITALS — BP 118/64 | HR 81 | Temp 98.7°F | Ht 64.0 in | Wt 113.0 lb

## 2019-09-10 DIAGNOSIS — R197 Diarrhea, unspecified: Secondary | ICD-10-CM | POA: Diagnosis not present

## 2019-09-10 DIAGNOSIS — K52832 Lymphocytic colitis: Secondary | ICD-10-CM | POA: Diagnosis not present

## 2019-09-10 DIAGNOSIS — S42255D Nondisplaced fracture of greater tuberosity of left humerus, subsequent encounter for fracture with routine healing: Secondary | ICD-10-CM | POA: Diagnosis not present

## 2019-09-10 DIAGNOSIS — M25612 Stiffness of left shoulder, not elsewhere classified: Secondary | ICD-10-CM | POA: Diagnosis not present

## 2019-09-10 LAB — BASIC METABOLIC PANEL
BUN: 12 mg/dL (ref 6–23)
CO2: 26 mEq/L (ref 19–32)
Calcium: 9.3 mg/dL (ref 8.4–10.5)
Chloride: 104 mEq/L (ref 96–112)
Creatinine, Ser: 0.56 mg/dL (ref 0.40–1.20)
GFR: 104.41 mL/min (ref >60.00)
Glucose, Bld: 108 mg/dL — ABNORMAL HIGH (ref 70–99)
Potassium: 4 mEq/L (ref 3.5–5.1)
Sodium: 139 mEq/L (ref 135–145)

## 2019-09-10 NOTE — Patient Instructions (Addendum)
Take budesonide 9 mg daily. This means 3 pills every morning.  Use the loperamide (Imodium) as needed for any additional diarrhea.  I have recommended some labs and stools studies to follow-up on your inflammation.  Your recent colon biopsies show lymphocytic colitis, a form of microscopic colitis. I recommend that you go to the UpToDate for more information on lymphocytic colitis.  Avoid aspirin, ibuprofen, Aleve, Naproxen, Goody's, BCs, and other NSAIDs. Use of those medications can make the colitis worse.  Let's follow-up in 4-6 weeks, or earlier as needed.

## 2019-09-10 NOTE — Progress Notes (Signed)
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 Only using budesonide 6 mg daily.    PLAN: Take full dose budesonide 9 mg daily for eight weeks with goal to taper when symptoms are well controlled Continue Loperamide recommended PRN for ongoing diarrhea Stool for fecal calprotectin to monitor response to therapy BMP to monitor for electrolyte disturbances Avoid all NSAIDs as this may flare lymphocytic colitis Follow-up in 4 to 6 weeks, or earlier as needed, to monitor response to therapy   Please see the "Patient Instructions" section for addition details about the plan.  HPI: Connie Alvarado is a 78 y.o. female under evaluation for diarrhea that initially developed after the death of her husband 03/18/2019. Initial virtual consultation was performed 06/22/19.  The patient had an EGD and colonoscopy 07/06/19 and was diagnosed with lymphocytci colitis. The Interval history is obtained through the patient and review of her electronic health record.  Ongoing regular alcohol use. 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  Currently taking budesonide 67m in the morning. Imodium 2 in the morning and sometimes a second dose in  the afternoon. She was confused about the dosing of budesonide.   The frequency of diarrhea has decreased now having 3-4 BM daily. But the stools remain soft and loose. No nocturnal symptoms. At it's worst she was having 3-8 watery, explosively unformed BM daily. Baseline bowel habits are once daily.  No nocturnal symptoms. No systemic complaints.No accidents since starting budesonide. One nocturnal BM with associated cramping.  No extra-GI manifestations.   Energy is poor. Weight is stable. No other associated symptoms. No identified exacerbating or relieving features. No new complaints or concerns.  .  Recent labs: 06/22/2019: CRP less than 1 and a sedimentation rate of 30. Stool studies 06/26/2019 showed a normal GI pathogen panel.  Fecal calprotectin was elevated at 679.  Pancreatic elastase 152.  Prior endoscopy: EGD 07/06/2019: gastritis, a small hiatal hernia, and was otherwise endoscopically normal.  Biopsies showed chronic gastritis without H. pylori, reflux, and normal duodenum.   Colonoscopy 07/06/2019: internal hemorrhoids, a 3 mm sigmoid tubular adenoma, and mild sigmoid and descending colon diverticulosis.  Random colon biopsies were consistent with lymphocytic colitis.  Recent abdominal imaging: A CT of the abdomen and pelvis with contrast 05/14/2019 shows cardiomegaly a nonobstructing stone in the upper pole of the left kidney, sigmoid diverticulosis without diverticulitis, no acute intra-abdominal abnormality, and a 1.4 cm groundglass opacity at the right lung base and a 0.6 cm pulmonary nodule in the peripheral left lower lobe.  The radiologist recommended a noncontrasted chest CT in 6 months.    Past Medical History:  Diagnosis Date  . Anxiety   . Depression   . Hyperlipidemia   . Malaria    as a child    Past Surgical  History:  Procedure Laterality Date  . [place]  . CARDIAC VALVE SURGERY  2016    Current Outpatient Medications  Medication Sig  Dispense Refill  . atorvastatin (LIPITOR) 80 MG tablet Take 1 tablet (80 mg total) by mouth daily. 90 tablet 1  . budesonide (ENTOCORT EC) 3 MG 24 hr capsule TAKE 3 CAPSULES(9 MG) BY MOUTH DAILY 90 capsule 1  . escitalopram (LEXAPRO) 10 MG tablet Take 0.5 tablets (5 mg total) by mouth daily. 30 tablet 2  . ibuprofen (ADVIL,MOTRIN) 800 MG tablet Take 3 x a day with food, avoid aleve with it, can take with tylenol (Patient not taking: Reported on 07/03/2019) 60 tablet 0  . LORazepam (ATIVAN) 0.5 MG tablet 1 tablet in the morning, 1/2 tablet at lunch and dinner PRN anxiety, daughter will keep 180 tablet 0  . Pancrelipase, Lip-Prot-Amyl, (CREON) 24000-76000 units CPEP Take 2 tablets with a meal and then 1 tablet with a snack. 250 capsule 1  . sertraline (ZOLOFT) 100 MG tablet 1-1.5 pills daily for depression/anxiety (Patient not taking: Reported on 07/06/2019) 180 tablet 2  . traZODone (DESYREL) 50 MG tablet 1/2-1 tablet for sleep (Patient not taking: Reported on 07/06/2019) 30 tablet 2  . VITAMIN D PO Take 1 tablet by mouth daily.     No current facility-administered medications for this visit.     Allergies as of 09/10/2019  . (No Known Allergies)    Family History  Problem Relation Age of Onset  . Cancer Mother   . Hypertension Mother   . Hyperlipidemia Mother   . Parkinson's disease Father   . Kidney disease Father   . Colon cancer Neg Hx   . Esophageal cancer Neg Hx   . Rectal cancer Neg Hx   . Stomach cancer Neg Hx     Social History   Socioeconomic History  . Marital status: Married    Spouse name: Not on file  . Number of children: 1  . Years of education: Not on file  . Highest education level: Not on file  Occupational History  . Not on file  Social Needs  . Financial resource strain: Not on file  . Food insecurity    Worry: Not on file    Inability: Not on file  . Transportation needs    Medical: Not on file    Non-medical: Not on file  Tobacco Use  . Smoking  status: Former Smoker    Types: Cigarettes    Quit date: 1980    Years since quitting: 40.7  . Smokeless tobacco: Never Used  Substance and Sexual Activity  . Alcohol use: Yes    Alcohol/week: 14.0 standard drinks    Types: 14 Glasses of wine per week  . Drug use: Never  . Sexual activity: Not Currently  Lifestyle  . Physical activity    Days per week: Not on file    Minutes per session: Not on file  . Stress: Not on file  Relationships  . Social Herbalist on phone: Not on file    Gets together: Not on file    Attends religious service: Not on file    Active member of club or organization: Not on file    Attends meetings of clubs or organizations: Not on file    Relationship status: Not on file  . Intimate partner violence    Fear of current or ex partner: Not on file    Emotionally abused: Not  on file    Physically abused: Not on file    Forced sexual activity: Not on file  Other Topics Concern  . Not on file  Social History Narrative  . Not on file   Physical Exam: Complete physical exam not performed due to the limits inherent in a telehealth encounter.  General: Awake, alert, and oriented, and well communicative. In no acute distress.  HEENT: EOMI, non-icteric sclera, NCAT, MMM  Neck: Normal movement of head and neck  Pulm: No labored breathing, speaking in full sentences without conversational dyspnea  Derm: No apparent lesions or bruising in visible field  MS: Moves all visible extremities without noticeable abnormality  Psych: Pleasant, cooperative, normal speech, normal affect and normal insight Neuro: Alert and appropriate   Arohi Salvatierra L. Tarri Glenn, MD, MPH [facility] 09/10/2019, 3:02 PM

## 2019-09-11 ENCOUNTER — Encounter: Payer: Self-pay | Admitting: *Deleted

## 2019-09-11 ENCOUNTER — Telehealth: Payer: Self-pay | Admitting: Gastroenterology

## 2019-09-11 NOTE — Telephone Encounter (Signed)
Pt's daughter called to inform that pt has been taking budesonide TID instead of BID as she had mispoken during the OV.  She also requested a copy of lab results to be forwarded to PCP.

## 2019-09-11 NOTE — Telephone Encounter (Signed)
Left detailed message on daughters voicemail with instructions.

## 2019-09-11 NOTE — Telephone Encounter (Signed)
Labs sent to PCP. Just an FYI Dr. Tarri Glenn

## 2019-09-11 NOTE — Telephone Encounter (Signed)
Budesonide it best taken 3 pills QD instead of 1 pill TID. Thank you.

## 2019-09-16 ENCOUNTER — Other Ambulatory Visit: Payer: Self-pay | Admitting: Physician Assistant

## 2019-09-17 ENCOUNTER — Other Ambulatory Visit: Payer: Medicare Other

## 2019-09-17 DIAGNOSIS — R197 Diarrhea, unspecified: Secondary | ICD-10-CM

## 2019-09-17 DIAGNOSIS — K52832 Lymphocytic colitis: Secondary | ICD-10-CM

## 2019-09-19 DIAGNOSIS — M25612 Stiffness of left shoulder, not elsewhere classified: Secondary | ICD-10-CM | POA: Diagnosis not present

## 2019-09-19 DIAGNOSIS — S42225D 2-part nondisplaced fracture of surgical neck of left humerus, subsequent encounter for fracture with routine healing: Secondary | ICD-10-CM | POA: Diagnosis not present

## 2019-09-25 DIAGNOSIS — M25612 Stiffness of left shoulder, not elsewhere classified: Secondary | ICD-10-CM | POA: Diagnosis not present

## 2019-09-25 DIAGNOSIS — S42225D 2-part nondisplaced fracture of surgical neck of left humerus, subsequent encounter for fracture with routine healing: Secondary | ICD-10-CM | POA: Diagnosis not present

## 2019-09-26 LAB — CALPROTECTIN, FECAL: Calprotectin, Fecal: 122 ug/g — ABNORMAL HIGH (ref 0–120)

## 2019-09-27 ENCOUNTER — Encounter: Payer: Self-pay | Admitting: *Deleted

## 2019-09-27 DIAGNOSIS — L821 Other seborrheic keratosis: Secondary | ICD-10-CM | POA: Diagnosis not present

## 2019-09-27 DIAGNOSIS — Z85828 Personal history of other malignant neoplasm of skin: Secondary | ICD-10-CM | POA: Diagnosis not present

## 2019-09-27 DIAGNOSIS — D1801 Hemangioma of skin and subcutaneous tissue: Secondary | ICD-10-CM | POA: Diagnosis not present

## 2019-10-02 DIAGNOSIS — S42225D 2-part nondisplaced fracture of surgical neck of left humerus, subsequent encounter for fracture with routine healing: Secondary | ICD-10-CM | POA: Diagnosis not present

## 2019-10-02 DIAGNOSIS — M25612 Stiffness of left shoulder, not elsewhere classified: Secondary | ICD-10-CM | POA: Diagnosis not present

## 2019-10-03 DIAGNOSIS — M25612 Stiffness of left shoulder, not elsewhere classified: Secondary | ICD-10-CM | POA: Diagnosis not present

## 2019-10-22 ENCOUNTER — Ambulatory Visit (INDEPENDENT_AMBULATORY_CARE_PROVIDER_SITE_OTHER): Payer: Medicare Other | Admitting: Gastroenterology

## 2019-10-22 ENCOUNTER — Other Ambulatory Visit: Payer: Self-pay

## 2019-10-22 ENCOUNTER — Other Ambulatory Visit (INDEPENDENT_AMBULATORY_CARE_PROVIDER_SITE_OTHER): Payer: Medicare Other

## 2019-10-22 ENCOUNTER — Encounter: Payer: Self-pay | Admitting: Gastroenterology

## 2019-10-22 VITALS — BP 118/66 | HR 70 | Temp 97.8°F | Ht 64.0 in | Wt 112.0 lb

## 2019-10-22 DIAGNOSIS — R197 Diarrhea, unspecified: Secondary | ICD-10-CM | POA: Diagnosis not present

## 2019-10-22 DIAGNOSIS — K52832 Lymphocytic colitis: Secondary | ICD-10-CM

## 2019-10-22 DIAGNOSIS — R739 Hyperglycemia, unspecified: Secondary | ICD-10-CM

## 2019-10-22 LAB — HEMOGLOBIN A1C: Hgb A1c MFr Bld: 5.6 % (ref 4.6–6.5)

## 2019-10-22 LAB — GLUCOSE, RANDOM: Glucose, Bld: 115 mg/dL — ABNORMAL HIGH (ref 70–99)

## 2019-10-22 NOTE — Progress Notes (Signed)
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  Add a second dose of Metamucil. Continue loperamide. Will not taper budesonide until her symptoms are controlled.   Given her ongoing diarrhea and alcohol use, with noted abnormal pancreatic elastase, will add Creon-24,000 lipase, one to two capsules with meals and one capsule if symptoms persist despite increasing Metamucil if fecal calprotectin continues to normalize.    PLAN: - Continue budesonide 9 mg daily - Continue Loperamide recommended PRN for ongoing diarrhea - Add a second dose of Metamucil in the evening in addition to the morning dose - Stool for fecal calprotectin to monitor response to therapy - Glucose and HgbA1C to monitor her recent hyperglycemia - Avoid all NSAIDs as this may flare lymphocytic colitis - Follow-up in 4 to 6 weeks, or earlier as needed, to monitor response to therapy - Add Creon-24,000 lipase, one to two capsules with meals and one capsule if diarrhea does not improve   Please see the "Patient Instructions" section for addition details about the plan.  HPI: Connie Alvarado is a 78 y.o. female under evaluation for diarrhea that initially developed after the death of her husband 2019-03-10. Initial virtual consultation was performed 06/22/19.   The patient had an EGD and colonoscopy 07/06/19 and was diagnosed with lymphocytic colitis and treatment with budesonide was initiated and then tapered. In September 2020 her budesonide was increased back to 9 mg daily with ongoing diarrhea.  The Interval history is obtained through the patient and review of her electronic health record. 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 She continues to use alcohol daily.   Currently taking budesonide 60m in the morning. Imodium 2 in the morning and sometimes a second dose in the afternoon. She was confused about the dosing of budesonide.   The frequency of diarrhea has decreased now having 3-4 BM daily. But the stools remain soft and loose. No nocturnal symptoms. At it's worst she was having 3-8 watery, explosively unformed BM daily. Baseline bowel habits are once daily.  No nocturnal symptoms. No systemic complaints.No accidents since starting budesonide. One nocturnal BM with associated cramping.  No extra-GI manifestations.   Energy is poor. Weight is stable. No other associated symptoms. No identified exacerbating or relieving features. No new complaints or concerns.   Continue to have poorly formed bowel movements. Wakes up to have two small bowel movements in the night. Only have one to two bowel movements in the daytime.  Appetite fluctuates. Weight is steady although still down from February.   Frequency has improved. Consistency has not. Now with loose, poorly formed.  Currently using Imodium as needed - some days not needed, some days two in the morning. She will rarely need it later in the day.  Using Metamucil every morning.  Appropriately grieving her husband Isoated from friends. Misses the stabilizing nature of her relationship.   Recent labs: 06/22/2019: CRP less than 1 and a sedimentation rate of 30. Stool studies  06/26/2019 showed a normal GI pathogen panel.  Fecal calprotectin was elevated at 679.  Pancreatic elastase 152. Labs 09/10/19: Na 139, K 4.0, glucose 108, BUN 12, creatinine 0.56 Stool studies 09/17/19: Fecal calprotectin 122  Prior endoscopy: EGD 07/06/2019: gastritis, a small hiatal hernia, and was otherwise endoscopically normal.  Biopsies showed chronic gastritis without H. pylori, reflux, and normal duodenum.   Colonoscopy 07/06/2019: internal hemorrhoids, a 3 mm sigmoid tubular adenoma, and mild sigmoid and descending colon diverticulosis.  Random colon biopsies were consistent with lymphocytic colitis.  Recent abdominal imaging: A CT of the abdomen and pelvis with contrast 05/14/2019 shows cardiomegaly a nonobstructing stone in the upper pole of the left kidney, sigmoid diverticulosis without diverticulitis, no acute intra-abdominal abnormality, and a 1.4 cm groundglass opacity at the right lung base and a 0.6 cm pulmonary nodule in the peripheral left lower lobe.  The radiologist recommended a noncontrasted chest CT in 6 months.    Past Medical History:  Diagnosis Date  . Anxiety   . Depression   . Hyperlipidemia   . Malaria    as a child    Past Surgical History:  Procedure Laterality Date  . [place]  . CARDIAC VALVE SURGERY  2016    Current Outpatient Medications  Medication Sig Dispense Refill  . atorvastatin (LIPITOR) 80 MG tablet Take 1 tablet (80 mg total) by mouth daily. 90 tablet 1  . budesonide (ENTOCORT EC) 3 MG 24 hr capsule TAKE 3 CAPSULES(9 MG) BY MOUTH DAILY 90 capsule 1  . escitalopram (LEXAPRO) 10 MG tablet Take 0.5 tablets (5 mg total) by mouth daily. (Patient taking differently: Take 10 mg by mouth daily. Twice a week) 30 tablet 2  . LORazepam (ATIVAN) 0.5 MG tablet 1 tablet in the morning, 1/2 tablet at lunch and dinner PRN anxiety, daughter will keep 180 tablet 0  . VITAMIN D PO Take 1 tablet by mouth daily.     No current  facility-administered medications for this visit.     Allergies as of 10/22/2019  . (No Known Allergies)    Family History  Problem Relation Age of Onset  . Cancer Mother   . Hypertension Mother   . Hyperlipidemia Mother   . Parkinson's disease Father   . Kidney disease Father   . Colon cancer Neg Hx   . Esophageal cancer Neg Hx   . Rectal cancer Neg Hx   . Stomach cancer Neg Hx     Social History   Socioeconomic History  . Marital status: Widowed    Spouse name: Not on file  . Number of children: 1  . Years of education: Not on file  . Highest education level: Not on file  Occupational History  . Not on file  Social Needs  . Financial resource strain: Not on file  . Food insecurity    Worry: Not on file    Inability: Not on file  . Transportation needs    Medical: Not on file    Non-medical: Not on file  Tobacco Use  . Smoking status: Former Smoker    Types: Cigarettes    Quit date: 1980    Years since quitting: 40.8  . Smokeless tobacco: Never Used  Substance and Sexual Activity  . Alcohol use: Yes  Alcohol/week: 14.0 standard drinks    Types: 14 Glasses of wine per week    Comment: wine  . Drug use: Never  . Sexual activity: Not Currently  Lifestyle  . Physical activity    Days per week: Not on file    Minutes per session: Not on file  . Stress: Not on file  Relationships  . Social Herbalist on phone: Not on file    Gets together: Not on file    Attends religious service: Not on file    Active member of club or organization: Not on file    Attends meetings of clubs or organizations: Not on file    Relationship status: Not on file  . Intimate partner violence    Fear of current or ex partner: Not on file    Emotionally abused: Not on file    Physically abused: Not on file    Forced sexual activity: Not on file  Other Topics Concern  . Not on file  Social History Narrative  . Not on file   Physical Exam: Complete physical exam  not performed due to the limits inherent in a telehealth encounter.  General: Awake, alert, and oriented, and well communicative. In no acute distress.  HEENT: EOMI, non-icteric sclera, NCAT, MMM  Neck: Normal movement of head and neck  Pulm: No labored breathing, speaking in full sentences without conversational dyspnea  Derm: No apparent lesions or bruising in visible field  MS: Moves all visible extremities without noticeable abnormality  Psych: Pleasant, cooperative, normal speech, normal affect and normal insight Neuro: Alert and appropriate   Chelsa Stout L. Tarri Glenn, MD, MPH [facility] 10/22/2019, 2:16 PM

## 2019-10-22 NOTE — Patient Instructions (Signed)
Continue to take budesonide 9 mg daily for the lymphocytic colitis.  Add an evening dose of Metamucil to your morning dose.  Continue to use loperamide (Imodium) - using 2 every morning and additional doses as needed over the day. I recommend trying a single dose at nighttime to prevent the having your bowel movements overnight.  Avoid foods and beverages that make you feel worse. For some people these include alcohol, drinks with caffeine, drinks with carbonation, dairy products, greasy foods and raw vegetables.   I recommend repeating your stool studies for inflammation and your blood sample to follow-up on your recent elevated blood sugars.  Let's plan to check in 4-6 weeks from now. Your daughter may come back to the appointment with you at that time.

## 2019-10-23 ENCOUNTER — Encounter: Payer: Self-pay | Admitting: *Deleted

## 2019-10-27 DIAGNOSIS — Z23 Encounter for immunization: Secondary | ICD-10-CM | POA: Diagnosis not present

## 2019-11-01 ENCOUNTER — Other Ambulatory Visit: Payer: Medicare Other

## 2019-11-01 DIAGNOSIS — K52832 Lymphocytic colitis: Secondary | ICD-10-CM | POA: Diagnosis not present

## 2019-11-01 DIAGNOSIS — R197 Diarrhea, unspecified: Secondary | ICD-10-CM | POA: Diagnosis not present

## 2019-11-01 DIAGNOSIS — R739 Hyperglycemia, unspecified: Secondary | ICD-10-CM | POA: Diagnosis not present

## 2019-11-06 LAB — CALPROTECTIN, FECAL: Calprotectin, Fecal: 317 ug/g — ABNORMAL HIGH (ref 0–120)

## 2019-11-07 ENCOUNTER — Encounter: Payer: Self-pay | Admitting: *Deleted

## 2019-11-08 ENCOUNTER — Other Ambulatory Visit: Payer: Self-pay | Admitting: *Deleted

## 2019-11-08 ENCOUNTER — Telehealth: Payer: Self-pay | Admitting: *Deleted

## 2019-11-08 MED ORDER — BISMUTH SUBSALICYLATE 262 MG PO CHEW: 786.0000 mg | CHEWABLE_TABLET | Freq: Three times a day (TID) | ORAL | 1 refills | Status: DC

## 2019-11-08 NOTE — Telephone Encounter (Signed)
Good evening Connie Alvarado/ Connie Alvarado    Per Dr. Tarri Glenn, Connie Alvarado marker of stool inflammation is still dramatically high. Please answer the following questions:    1. How is Connie Alvarado diarrhea?  2. How many bowel movements is Connie Alvarado having a day?  3. Is Connie Alvarado still taking the Budesonide 9 mg daily?      We may need to make some adjustments based on these answers. Thanks.  Zabelle, [place] to Me      11/07/19 4:49 PM 1) Watery.  She has yet to have a normal bowel movement. It is always diarrhea and it is often without warning. 2)About two in the middle of the night and two in the morning.  Not sure about the rest of the day. 3) Yes

## 2019-11-08 NOTE — Telephone Encounter (Signed)
Thank you for the follow-up. I am sorry that she continues to have so much diarrhea despite full-dose budesonide. I would like for her to add bismuth subsalicylate three [id number] mg tablets 3 times daily.  This is Pepto-Bismol tablets not liquid.  This has been shown to improve diarrhea and microscopic colitis.  I am hoping that the combination of the bismuth subsalicylate and the budesonide improves her stool frequency.  Her stools will be black while taking Pepto-Bismol.  I want to make sure she is drinking enough fluids to avoid dehydration.  She is adequately hydrated when her urine is a pale yellow color.  Please schedule a follow-up appointment with me in 3 weeks.  If this treatment does not work we will need to consider treatments that have more significant side effects.  Thank you.

## 2019-11-08 NOTE — Telephone Encounter (Signed)
MyChart message sent to patient, bismuth sub prescription sent to pharmacy on file, f/u has already previously scheduled by Desiree on 12/4 at 3:20 pm.

## 2019-11-12 ENCOUNTER — Other Ambulatory Visit: Payer: Self-pay | Admitting: Gastroenterology

## 2019-11-12 MED ORDER — AMOXICILLIN-POT CLAVULANATE 875-125 MG PO TABS: 1.0000 | ORAL_TABLET | Freq: Two times a day (BID) | ORAL | 0 refills | Status: DC

## 2019-11-21 MED ORDER — AMOXICILLIN-POT CLAVULANATE 875-125 MG PO TABS: 1.0000 | ORAL_TABLET | Freq: Two times a day (BID) | ORAL | 0 refills | Status: AC

## 2019-11-21 MED ORDER — ESCITALOPRAM OXALATE 10 MG PO TABS: 5.0000 mg | ORAL_TABLET | Freq: Every day | ORAL | 2 refills | Status: DC

## 2019-11-30 ENCOUNTER — Other Ambulatory Visit: Payer: Self-pay

## 2019-11-30 ENCOUNTER — Ambulatory Visit (INDEPENDENT_AMBULATORY_CARE_PROVIDER_SITE_OTHER): Payer: Medicare Other | Admitting: Gastroenterology

## 2019-11-30 ENCOUNTER — Encounter: Payer: Self-pay | Admitting: Gastroenterology

## 2019-11-30 VITALS — BP 116/66 | HR 68 | Temp 98.6°F | Ht 64.0 in | Wt 113.0 lb

## 2019-11-30 DIAGNOSIS — R634 Abnormal weight loss: Secondary | ICD-10-CM

## 2019-11-30 DIAGNOSIS — R197 Diarrhea, unspecified: Secondary | ICD-10-CM | POA: Diagnosis not present

## 2019-11-30 DIAGNOSIS — K52832 Lymphocytic colitis: Secondary | ICD-10-CM

## 2019-11-30 DIAGNOSIS — R739 Hyperglycemia, unspecified: Secondary | ICD-10-CM | POA: Diagnosis not present

## 2019-11-30 MED ORDER — BUDESONIDE ER 9 MG PO TB24: 9.0000 mg | ORAL_TABLET | Freq: Every day | ORAL | 3 refills | Status: DC

## 2019-11-30 NOTE — Patient Instructions (Addendum)
You have gained one pound since October.   Continue budesonide 9 mg daily. We will see if your pharmacy plan will cover UCERIS instead of the budesonide.  Continue PeptoBismal three 262 mg tablets 3 times every day. Take this every day.   Continue Metamucil one tablespoon twice daily.  Use Imodium as you need to for any ongoing diarrhea.   Avoid all NSAIDs.   Reducing your daily alcohol intake may also improve your diarrhea.   If things don't continue to improve, we may need to try systemic steroids such as prednisone or a more potent immune suppressing medication in the future.  Follow-up in 4-6 weeks, or earlier as needed.   Please call me with any questions or concerns before your next appointment.  Please ask Connie Alvarado about your Focus Factor and other options for memory treatment.

## 2019-11-30 NOTE — Progress Notes (Signed)
Referring Provider: Unk Pinto, MD Primary Care Physician:  Unk Pinto, MD  Chief complaint:  Weight loss and diarrhea   IMPRESSION:  Lymphocytic colitis diagnosed on colonoscopy 07/06/2019     -Presenting with diarrhea having 3-8 watery explosive unformed bowel movements daily    - labs 06/22/19: CRP <1.0, ESR 30    - stool sutides 06/26/19: negative GI pathogen, pancreatic elastase 152, fecal calprotectin 679    - normal TSH 0.75 Pancreatic elastase of 152 Hyperglycemia Small tubular adenoma removed on colonoscopy 07/06/2019 Unintentional 20 pound weight loss since death of husband 2019/03/14 Daily alcohol use  Ongoing symptoms and increased fecal calprotectin despite some clinical improvement on budesonide 9 mg daily. A switch to UCERIS may provide some additional clinical improvement in her diarrhea.  Continue PeptoBismal three 262 mg tablets 3 times. Add a second dose of Metamucil. Continue loperamide. Will not taper budesonide until her symptoms are controlled.   I've recommended that she taper her alcohol use. Marland Kitchen    PLAN: - Continue budesonide 9 mg daily, attempt switch to [place] if her insurance will approve - Continue PeptoBismal three 262 mg tablets 3 times daily - Continue Loperamide recommended PRN for ongoing diarrhea - Add a second dose of Metamucil in the evening in addition to the morning dose - Stool for fecal calprotectin to monitor response to therapy in 1 month - Avoid all NSAIDs as this may flare lymphocytic colitis - Follow-up in 4 to 6 weeks, or earlier as needed, to monitor response to therapy - Cut back on your daily alcohol - Add Creon-24,000 lipase, one to two capsules with meals and one capsule if diarrhea does not improve   Please see the "Patient Instructions" section for addition details about the plan.  HPI: Connie Alvarado is a 78 y.o. female under evaluation for diarrhea that initially developed after the death of her husband 03/14/2019.  Initial virtual consultation was performed 06/22/19.  The patient had an EGD and colonoscopy 07/06/19 and was diagnosed with lymphocytic colitis and treatment with budesonide was initiated and then tapered to 6 mg daily. In September 2020 her budesonide was increased back to 9 mg daily with ongoing diarrhea.  The interval history is obtained through the patient and review of her electronic health record. 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 She continues to use alcohol daily.   Currently taking budesonide 65m in the morning. Imodium 2 in the morning and sometimes a second dose in the afternoon. Added bismuth subsalicylate three 2527mg tablets 3 times daily last month for persistent symptoms.  Physically doing better with the addition of PeptoBismal. Still feeling depressed.  Using Imodium once daily.  She can't remember if she is using the Metamucil as previously recommended. Diarrhea is better with an improvement in frequency. Still having one BM at night. 1-2 BM daily. Had been having up to 8 watery, explosive, BM daily.  Not as watery or as explosive now. Still not formed.  No blood or mucous. No nocturnal symptoms.  Stools are black on PeptoBismal. Taking to BID instead of TID.  Appetite continues to fluctuate. Weight is stable.  Continues to drink 2 glasses of wine every night.   Taking something called Focus Factor. Interested in discussing OTC memory treatments.    Recent labs: 06/22/2019: CRP less than 1 and a sedimentation rate of 30. Stool studies 06/26/2019 showed a normal GI pathogen panel.  Fecal  calprotectin was elevated at 679.  Pancreatic elastase 152. Labs 09/10/19: Na 139, K 4.0, glucose 108, BUN 12, creatinine 0.56 Stool studies 09/17/19: Fecal calprotectin 122 Stool studies 11/01/19: Fecal calprotectin 317  Prior endoscopy: EGD 07/06/2019: gastritis, a small  hiatal hernia, and was otherwise endoscopically normal.  Biopsies showed chronic gastritis without H. pylori, reflux, and normal duodenum.   Colonoscopy 07/06/2019: internal hemorrhoids, a 3 mm sigmoid tubular adenoma, and mild sigmoid and descending colon diverticulosis.  Random colon biopsies were consistent with lymphocytic colitis.  Recent abdominal imaging: A CT of the abdomen and pelvis with contrast 05/14/2019 shows cardiomegaly a nonobstructing stone in the upper pole of the left kidney, sigmoid diverticulosis without diverticulitis, no acute intra-abdominal abnormality, and a 1.4 cm groundglass opacity at the right lung base and a 0.6 cm pulmonary nodule in the peripheral left lower lobe.  The radiologist recommended a noncontrasted chest CT in 6 months.    Past Medical History:  Diagnosis Date  . Anxiety   . Depression   . Hyperlipidemia   . Malaria    as a child    Past Surgical History:  Procedure Laterality Date  . [place]  . CARDIAC VALVE SURGERY  2016    Current Outpatient Medications  Medication Sig Dispense Refill  . atorvastatin (LIPITOR) 80 MG tablet Take 1 tablet (80 mg total) by mouth daily. 90 tablet 1  . bismuth subsalicylate (PEPTO BISMOL) 262 MG chewable tablet Chew 3 tablets (786 mg total) by mouth 3 (three) times daily. 270 tablet 1  . budesonide (ENTOCORT EC) 3 MG 24 hr capsule TAKE 3 CAPSULES(9 MG) BY MOUTH DAILY 90 capsule 1  . escitalopram (LEXAPRO) 10 MG tablet Take 0.5 tablets (5 mg total) by mouth daily. 30 tablet 2  . LORazepam (ATIVAN) 0.5 MG tablet 1 tablet in the morning, 1/2 tablet at lunch and dinner PRN anxiety, daughter will keep 180 tablet 0  . VITAMIN D PO Take 1 tablet by mouth daily.     No current facility-administered medications for this visit.     Allergies as of 11/30/2019  . (No Known Allergies)    Family History  Problem Relation Age of Onset  . Cancer Mother   . Hypertension Mother   . Hyperlipidemia  Mother   . Parkinson's disease Father   . Kidney disease Father   . Colon cancer Neg Hx   . Esophageal cancer Neg Hx   . Rectal cancer Neg Hx   . Stomach cancer Neg Hx     Social History   Socioeconomic History  . Marital status: Widowed    Spouse name: Not on file  . Number of children: 1  . Years of education: Not on file  . Highest education level: Not on file  Occupational History  . Not on file  Social Needs  . Financial resource strain: Not on file  . Food insecurity    Worry: Not on file    Inability: Not on file  . Transportation needs    Medical: Not on file    Non-medical: Not on file  Tobacco Use  . Smoking status: Former Smoker    Types: Cigarettes    Quit date: 1980    Years since quitting: 40.9  . Smokeless tobacco: Never Used  Substance and Sexual Activity  . Alcohol use: Yes    Alcohol/week: 14.0 standard drinks    Types: 14 Glasses of wine per week    Comment: wine  .  Drug use: Never  . Sexual activity: Not Currently  Lifestyle  . Physical activity    Days per week: Not on file    Minutes per session: Not on file  . Stress: Not on file  Relationships  . Social Herbalist on phone: Not on file    Gets together: Not on file    Attends religious service: Not on file    Active member of club or organization: Not on file    Attends meetings of clubs or organizations: Not on file    Relationship status: Not on file  . Intimate partner violence    Fear of current or ex partner: Not on file    Emotionally abused: Not on file    Physically abused: Not on file    Forced sexual activity: Not on file  Other Topics Concern  . Not on file  Social History Narrative  . Not on file   Physical Exam: General: Awake, alert, and oriented, pleasant. In no acute distress.  HEENT: EOMI, non-icteric sclera, NCAT, MMM  Neck: Normal movement of head and neck  Pulm: No labored breathing, speaking in full sentences without conversational dyspnea   Abdomen: Soft, NT, ND, normal bowel sounds, without rebound or guarding Derm: No obvious rash or bruise  MS: Moves all visible extremities without noticeable abnormality  Psych: Pleasant, cooperative, normal speech, normal affect and normal insight Neuro: Alert and appropriate   Daziah Hesler L. Tarri Glenn, MD, MPH [facility] 11/30/2019, 1:03 PM

## 2019-12-04 ENCOUNTER — Encounter: Payer: Self-pay | Admitting: Gastroenterology

## 2019-12-06 ENCOUNTER — Ambulatory Visit: Payer: Medicare Other | Admitting: Physician Assistant

## 2019-12-11 ENCOUNTER — Telehealth: Payer: Self-pay | Admitting: Gastroenterology

## 2019-12-11 NOTE — Telephone Encounter (Signed)
Yes. The UCERIS was a substitute for the budesonide. If she is unable to afford it, she should definitely continue on budesonide 9mg  daily.

## 2019-12-11 NOTE — Telephone Encounter (Signed)
Left message on patient's daughter voice mail to call office

## 2019-12-11 NOTE — Telephone Encounter (Signed)
Ok to continue Budesonide?

## 2019-12-17 NOTE — Progress Notes (Signed)
Very complicated patient with multiple co morbidities and difficult history, history from daughter, lauren as well.   Assessment and Plan:  B12 deficiency Continue B12  Vitamin D deficiency -     Vitamin D (25 hydroxy)  Depression, major, recurrent, in partial remission (HCC) -     gabapentin (NEURONTIN) 300 MG capsule; Take 1 capsule (300 mg total) by mouth 3 (three) times daily. Feels lexapro is not helping, she has chronic pain, has trouble sleeping and tremor - will try her on gabapentin to see if this can help several of these issues, will monitor closely - would benefit from counseling  Mixed hyperlipidemia -     Lipid Profile check lipids decrease fatty foods increase activity.   Medication management -     CBC with Diff -     COMPLETE METABOLIC PANEL WITH GFR -     Magnesium  Alcohol abuse with alcohol-induced disorder (HCC) Discussed cutting back on alochol  Tremor -     gabapentin (NEURONTIN) 300 MG capsule; Take 1 capsule (300 mg total) by mouth 3 (three) times daily. -     Ambulatory referral to Neurology - likely essential tremor but will refer to neuro with memory issues and family history  Diarrhea, unspecified type -     bismuth subsalicylate (PEPTO BISMOL) 262 MG chewable tablet; Chew 3 tablets (786 mg total) by mouth 3 (three) times daily. - continue to follow up GI, cut back on alcohol, it is bad for her colititis  Memory loss -     Ambulatory referral to Neurology - normal MRI, likely age related, discussed that drinking will make this worse and more progressive  Hypothyroidism, unspecified type -     TSH Hypothyroidism-check TSH level, continue medications the same, reminded to take on an empty stomach 30-66mins before food.   Other orders -     Pneumococcal conjugate vaccine 13-valent    Discussed med's effects and SE's. Screening labs and tests as requested with regular follow-up as recommended. Over 60 minutes of exam, counseling, chart  review, and complex, high level critical decision making was performed this visit.  Future Appointments  Date Time Provider [place]  01/15/2020  3:20 PM Thornton Park, MD [facility]  04/17/2020  2:30 PM Vicie Mutters, PA-C [facility] None    HPI  78 y.o. female  presents for follow up for has Alcohol abuse with alcohol-induced disorder ([place]); Medication management; Mixed hyperlipidemia; H/O prosthetic aortic valve replacement; B12 deficiency; Anemia; Vitamin D deficiency; Depression, major, recurrent, in partial remission ([place]); Enrolled in chronic care management; and Lung nodules on their problem list..  She has been having diarrhea and weight loss. She has a history of alcoholism, she is back to drinking a bottle of wine at night.  She was found to have lymphocytic colitis and is following with GI, on budesonide.   She is on lexapro 5 mg for depression from loss of her husband, move, and pandemic, (switched from zoloft and celexa). Daughter states her memory continues to worsen,  She has had shaking x oct or longer but has gotten worse. Not associated with drinking or not drinking.  Father had parkinson's. Tremor is with intention. No family history of essential tremor. Will drink a bottle of wine at night.  .  She had a normal CT head WITHOUT contrast 03/08/2019. MRI 04/2019 showed: IMPRESSION: Generalized atrophy, not unexpected for age.  Moderately advanced T2 and FLAIR hyperintensities in the white matter consistent with chronic microvascular ischemic change.  No acute intracranial findings. No concerning abnormal postcontrast enhancement.  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  BMI is Body mass index is 19.5 kg/m. Wt Readings from Last 8 Encounters:  12/18/19 113 lb 9.6 oz (51.5  kg)  11/30/19 113 lb (51.3 kg)  10/22/19 112 lb (50.8 kg)  09/10/19 113 lb (51.3 kg)  07/06/19 109 lb (49.4 kg)  07/03/19 111 lb (50.3 kg)  05/02/19 117 lb 9.6 oz (53.3 kg)  04/19/19 118 lb (53.5 kg)   Her blood pressure has been controlled at home, today their BP is BP: 122/68   Lab Results  Component Value Date   GFRNONAA 85 12/18/2019    She  is  on cholesterol medication: Lipitor Her cholesterol is not at goal, Goal of less than 70 The cholesterol last visit was:   Lab Results  Component Value Date   CHOL 217 (H) 12/18/2019   HDL 96 12/18/2019   LDLCALC 102 (H) 12/18/2019   TRIG 91 12/18/2019   CHOLHDL 2.3 12/18/2019   Lab Results  Component Value Date   [id number] 03/08/2019     Current Medications:  Current Outpatient Medications on File Prior to Visit  Medication Sig Dispense Refill  . atorvastatin (LIPITOR) 80 MG tablet Take 1 tablet (80 mg total) by mouth daily. 90 tablet 1  . Budesonide ER (UCERIS) 9 MG TB24 Take 9 mg by mouth daily. 30 tablet 3  . VITAMIN D PO Take 1 tablet by mouth daily.     No current facility-administered medications on file prior to visit.   Allergies:  No Known Allergies Medical History:  She has Alcohol abuse with alcohol-induced disorder ([place]); Medication management; Mixed hyperlipidemia; H/O prosthetic aortic valve replacement; B12 deficiency; Anemia; Vitamin D deficiency; Depression, major, recurrent, in partial remission ([place]); Enrolled in chronic care management; and Lung nodules on their problem list.  Will request complete records from previous doctors  Surgical History:  She has a past surgical history that includes Cardiac valuve replacement (1999) and Cardiac valuve replacement (2016). Family History:  Herfamily history includes Cancer in her mother; Hyperlipidemia in her mother; Hypertension in her mother; Kidney disease in her father; Parkinson's disease in her father. Social History:  She reports that she quit  smoking about 41 years ago. Her smoking use included cigarettes. She has never used smokeless tobacco. She reports current alcohol use of about 14.0 standard drinks of alcohol per week. She reports that she does not use drugs.   Review of Systems: Review of Systems  Constitutional: Positive for malaise/fatigue and weight loss. Negative for chills, diaphoresis and fever.  HENT: Negative.   Eyes: Negative.   Respiratory: Negative.   Cardiovascular: Negative.   Gastrointestinal: Positive for diarrhea.  Genitourinary: Negative.   Musculoskeletal: Positive for falls and joint pain. Negative for back pain, myalgias and neck pain.  Skin: Negative.   Neurological: Positive for dizziness (very rare in the morning) and tremors. Negative for tingling, sensory change, speech change, focal weakness, seizures, loss of consciousness, weakness and headaches.  Endo/Heme/Allergies: Negative.   Psychiatric/Behavioral: Positive for depression, memory loss and substance abuse. Negative for hallucinations and suicidal ideas. The patient is nervous/anxious. The patient does not have insomnia.      Physical Exam: Estimated body mass index is 19.5 kg/m as calculated from the following:   Height as of 11/30/19: 5\' 4"  (  1.626 m).   Weight as of this encounter: 113 lb 9.6 oz (51.5 kg). BP 122/68   Pulse 66   Temp 97.7 F (36.5 C)   Wt 113 lb 9.6 oz (51.5 kg)   SpO2 95%   BMI 19.50 kg/m  General Appearance: elderly frail appearing female in no apparent distress Eyes: PERRLA, EOMs, conjunctiva no swelling or erythema Sinuses: No Frontal/maxillary tenderness ENT/Mouth: Ext aud canals clear, normal light reflex with TMs without erythema, bulging.  No erythema, swelling, or exudate on post pharynx. Tonsils not swollen or erythematous. Hearing good Neck: Supple, thyroid normal. No bruits  Respiratory: Respiratory effort normal, BS equal bilaterally without rales, rhonchi, wheezing or stridor.  Cardio: RRR with  2/6 systolic murmur without rubs or gallops.  Chest: symmetric, with normal excursions and percussion.  Abdomen: Soft, nontender, no guarding, rebound, hernias, masses, or organomegaly.  Lymphatics: Non tender without lymphadenopathy.  Musculoskeletal: Full ROM all peripheral extremities, good grip strength, antalgic, unsteady gait.  Skin: Warm, dry without rashes, lesions, ecchymosis. Neuro: Cranial nerves intact,,no cog wheeling, Normal muscle tone, wide based stance,  Sensation intact.  Psych: Awake and oriented X 3, normal affect, Insight and Judgment appropriate, depressed mood.     Vicie Mutters 4:30 PM [facility]

## 2019-12-18 ENCOUNTER — Encounter: Payer: Self-pay | Admitting: Physician Assistant

## 2019-12-18 ENCOUNTER — Other Ambulatory Visit: Payer: Self-pay

## 2019-12-18 ENCOUNTER — Ambulatory Visit (INDEPENDENT_AMBULATORY_CARE_PROVIDER_SITE_OTHER): Payer: Medicare Other | Admitting: Physician Assistant

## 2019-12-18 VITALS — BP 122/68 | HR 66 | Temp 97.7°F | Wt 113.6 lb

## 2019-12-18 DIAGNOSIS — R197 Diarrhea, unspecified: Secondary | ICD-10-CM

## 2019-12-18 DIAGNOSIS — Z79899 Other long term (current) drug therapy: Secondary | ICD-10-CM

## 2019-12-18 DIAGNOSIS — E538 Deficiency of other specified B group vitamins: Secondary | ICD-10-CM

## 2019-12-18 DIAGNOSIS — E039 Hypothyroidism, unspecified: Secondary | ICD-10-CM

## 2019-12-18 DIAGNOSIS — F1019 Alcohol abuse with unspecified alcohol-induced disorder: Secondary | ICD-10-CM

## 2019-12-18 DIAGNOSIS — Z23 Encounter for immunization: Secondary | ICD-10-CM

## 2019-12-18 DIAGNOSIS — F3341 Major depressive disorder, recurrent, in partial remission: Secondary | ICD-10-CM

## 2019-12-18 DIAGNOSIS — E559 Vitamin D deficiency, unspecified: Secondary | ICD-10-CM

## 2019-12-18 DIAGNOSIS — R413 Other amnesia: Secondary | ICD-10-CM

## 2019-12-18 DIAGNOSIS — E782 Mixed hyperlipidemia: Secondary | ICD-10-CM

## 2019-12-18 DIAGNOSIS — R251 Tremor, unspecified: Secondary | ICD-10-CM

## 2019-12-18 MED ORDER — BISMUTH SUBSALICYLATE 262 MG PO CHEW: 786.0000 mg | CHEWABLE_TABLET | Freq: Three times a day (TID) | ORAL | 1 refills | Status: AC

## 2019-12-18 MED ORDER — GABAPENTIN 300 MG PO CAPS: 300.0000 mg | ORAL_CAPSULE | Freq: Three times a day (TID) | ORAL | 2 refills | Status: DC

## 2019-12-18 NOTE — Patient Instructions (Addendum)
Can take the gabapentin 300mg  at night.   It can make you sleepy so we suggest trying it at night first and please plan to not drive or do anything strenuous.  Also please do not take this medication with alcohol.   Start out 1 pill at night before bed, can increase to 2 pills at night before bed. Please call the office if you have any side effects.   Can take 3 pills a day total however you would like  Some examples: - 1 breakfast, lunch, bedtime. - 1 at breakfast, 2 at bed time  Common side effects are sleepiness, concentration problems, dizziness, swelling.  Do not stop abruptly unless you have a reaction to it.    Essential Tremor A tremor is trembling or shaking that a person cannot control. Most tremors affect the hands or arms. Tremors can also affect the head, vocal cords, legs, and other parts of the body. Essential tremor is a tremor without a known cause. Usually, it occurs while a person is trying to perform an action. It tends to get worse gradually as a person ages. What are the causes? The cause of this condition is not known. What increases the risk? You are more likely to develop this condition if:  You have a family member with essential tremor.  You are age 79 or older.  You take certain medicines. What are the signs or symptoms? The main sign of a tremor is a rhythmic shaking of certain parts of your body that is uncontrolled and unintentional. You may:  Have difficulty eating with a spoon or fork.  Have difficulty writing.  Nod your head up and down or side to side.  Have a quivering voice. The shaking may:  Get worse over time.  Come and go.  Be more noticeable on one side of your body.  Get worse due to stress, fatigue, caffeine, and extreme heat or cold. How is this diagnosed? This condition may be diagnosed based on:  Your symptoms and medical history.  A physical exam. There is no single test to diagnose an essential tremor. However, your  health care provider may order tests to rule out other causes of your condition. These may include:  Blood and urine tests.  Imaging studies of your brain, such as CT scan and MRI.  A test that measures involuntary muscle movement (electromyogram). How is this treated? Treatment for essential tremor depends on the severity of the condition.  Some tremors may go away without treatment.  Mild tremors may not need treatment if they do not affect your day-to-day life.  Severe tremors may need to be treated using one or more of the following options: ? Medicines. ? Lifestyle changes. ? Occupational or physical therapy. Follow these instructions at home: Lifestyle   Do not use any products that contain nicotine or tobacco, such as cigarettes and e-cigarettes. If you need help quitting, ask your health care provider.  Limit your caffeine intake as told by your health care provider.  Try to get 8 hours of sleep each night.  Find ways to manage your stress that fits your lifestyle and personality. Consider trying meditation or yoga.  Try to anticipate stressful situations and allow extra time to manage them.  If you are struggling emotionally with the effects of your tremor, consider working with a mental health provider. General instructions  Take over-the-counter and prescription medicines only as told by your health care provider.  Avoid extreme heat and extreme cold.  Keep all follow-up visits as told by your health care provider. This is important. Visits may include physical therapy visits. Contact a health care provider if:  You experience any changes in the location or intensity of your tremors.  You start having a tremor after starting a new medicine.  You have tremor with other symptoms, such as: ? Numbness. ? Tingling. ? Pain. ? Weakness.  Your tremor gets worse.  Your tremor interferes with your daily life.  You feel down, blue, or sad for at least 2 weeks  in a row.  Worrying about your tremor and what other people think about you interferes with your everyday life functions, including relationships, work, or school. Summary  Essential tremor is a tremor without a known cause. Usually, it occurs when you are trying to perform an action.  The cause of this condition is not known.  The main sign of a tremor is a rhythmic shaking of certain parts of your body that is uncontrolled and unintentional.  Treatment for essential tremor depends on the severity of the condition. This information is not intended to replace advice given to you by your health care provider. Make sure you discuss any questions you have with your health care provider. Document Released: 01/03/2015 Document Revised: 12/23/2017 Document Reviewed: 12/23/2017 Elsevier Patient Education  2020 [employer].

## 2019-12-19 LAB — CBC WITH DIFFERENTIAL/PLATELET
Absolute Monocytes: 490 cells/uL (ref 200–950)
Basophils Absolute: 50 cells/uL (ref 0–200)
Basophils Relative: 0.7 %
Eosinophils Absolute: 79 cells/uL (ref 15–500)
Eosinophils Relative: 1.1 %
HCT: 37.5 % (ref 35.0–45.0)
Hemoglobin: 12.3 g/dL (ref 11.7–15.5)
Lymphs Abs: 1001 cells/uL (ref 850–3900)
MCH: 30.8 pg (ref 27.0–33.0)
MCHC: 32.8 g/dL (ref 32.0–36.0)
MCV: 93.8 fL (ref 80.0–100.0)
MPV: 11.5 fL (ref 7.5–12.5)
Monocytes Relative: 6.8 %
Neutro Abs: 5580 cells/uL (ref 1500–7800)
Neutrophils Relative %: 77.5 %
Platelets: 174 10*3/uL (ref 140–400)
RBC: 4 10*6/uL (ref 3.80–5.10)
RDW: 12.4 % (ref 11.0–15.0)
Total Lymphocyte: 13.9 %
WBC: 7.2 10*3/uL (ref 3.8–10.8)

## 2019-12-19 LAB — TSH: TSH: 1.68 mIU/L (ref 0.40–4.50)

## 2019-12-19 LAB — COMPLETE METABOLIC PANEL WITH GFR
AG Ratio: 1.4 (calc) (ref 1.0–2.5)
ALT: 5 U/L — ABNORMAL LOW (ref 6–29)
AST: 31 U/L (ref 10–35)
Albumin: 3.8 g/dL (ref 3.6–5.1)
Alkaline phosphatase (APISO): 71 U/L (ref 37–153)
BUN: 22 mg/dL (ref 7–25)
CO2: 30 mmol/L (ref 20–32)
Calcium: 9.2 mg/dL (ref 8.6–10.4)
Chloride: 106 mmol/L (ref 98–110)
Creat: 0.63 mg/dL (ref 0.60–0.93)
GFR, Est African American: 99 mL/min/{1.73_m2} (ref >=60)
GFR, Est Non African American: 85 mL/min/{1.73_m2} (ref >=60)
Globulin: 2.7 g/dL (calc) (ref 1.9–3.7)
Glucose, Bld: 136 mg/dL — ABNORMAL HIGH (ref 65–99)
Potassium: 3.9 mmol/L (ref 3.5–5.3)
Sodium: 143 mmol/L (ref 135–146)
Total Bilirubin: 0.5 mg/dL (ref 0.2–1.2)
Total Protein: 6.5 g/dL (ref 6.1–8.1)

## 2019-12-19 LAB — LIPID PANEL
Cholesterol: 217 mg/dL — ABNORMAL HIGH (ref <200)
HDL: 96 mg/dL (ref >=50)
LDL Cholesterol (Calc): 102 mg/dL (calc) — ABNORMAL HIGH
Non-HDL Cholesterol (Calc): 121 mg/dL (calc) (ref <130)
Total CHOL/HDL Ratio: 2.3 (calc) (ref <5.0)
Triglycerides: 91 mg/dL (ref <150)

## 2019-12-19 LAB — MAGNESIUM: Magnesium: 2.1 mg/dL (ref 1.5–2.5)

## 2019-12-19 LAB — VITAMIN D 25 HYDROXY (VIT D DEFICIENCY, FRACTURES): Vit D, 25-Hydroxy: 27 ng/mL — ABNORMAL LOW (ref 30–100)

## 2019-12-24 NOTE — Progress Notes (Signed)
Yes I remember giving it to her, the order was being unpacked while she was in office.

## 2020-01-11 ENCOUNTER — Telehealth: Payer: Self-pay | Admitting: Gastroenterology

## 2020-01-11 NOTE — Telephone Encounter (Signed)
Pt stated that she was advised to not take ibuprofen.  Pt inquired what OTC products she can take for allergies.

## 2020-01-13 ENCOUNTER — Other Ambulatory Visit: Payer: Self-pay | Admitting: Gastroenterology

## 2020-01-14 NOTE — Telephone Encounter (Signed)
This will be addressed at patients upcoming appointment on 01/15/20.

## 2020-01-15 ENCOUNTER — Ambulatory Visit (INDEPENDENT_AMBULATORY_CARE_PROVIDER_SITE_OTHER): Payer: Medicare Other | Admitting: Gastroenterology

## 2020-01-15 ENCOUNTER — Encounter: Payer: Self-pay | Admitting: Gastroenterology

## 2020-01-15 DIAGNOSIS — K52832 Lymphocytic colitis: Secondary | ICD-10-CM | POA: Diagnosis not present

## 2020-01-15 MED ORDER — BUDESONIDE ER 9 MG PO TB24: 9.0000 mg | ORAL_TABLET | Freq: Every day | ORAL | 3 refills | Status: DC

## 2020-01-15 NOTE — Patient Instructions (Signed)
-   Continue budesonide 9 mg daily, attempt switch to [place] if her insurance will approve  - Continue PeptoBismal three262 mg tablets 3 times daily  - Continue Loperamide recommended PRN for ongoing diarrhea  - Add a second dose of Metamucil in the evening in addition to the morning dose  - Stool for fecal calprotectin to monitor response to therapy in 1 month  - Avoid all NSAIDs as this may flare lymphocytic colitis  - Follow-up in 8 weeks, or earlier as needed, to monitor response to therapy  - Cut back on your daily alcohol

## 2020-01-15 NOTE — Progress Notes (Addendum)
TELEHEALTH VISIT  Referring Provider: Unk Pinto, MD Primary Care Physician:  Unk Pinto, MD  Tele-visit due to COVID-19 pandemic Patient requested visit virtually, consented to the virtual encounter via video enabled telemedicine application (Zoom) Contact made at: 01/15/20 15:20 Patient verified by name and date of birth Location of patient: Home Location provider: [facility] Names of persons participating:Me, patient, daughter Neita Carp),  [place] Time spent on telehealth visit: 32 minutes I discussed the limitations of evaluation and management by telemedicine. The patient expressed understanding and agreed to proceed.  Chief complaint:  Diarrhea   IMPRESSION:  Lymphocytic colitis diagnosed on colonoscopy 07/06/2019     -Presenting with diarrhea having 3-8 watery explosive unformed bowel movements daily    - labs 06/22/19: CRP <1.0, ESR 30    - stool sutides 06/26/19: negative GI pathogen, pancreatic elastase 152, fecal calprotectin 679    - normal TSH 0.75 Pancreatic elastase of 152 Hyperglycemia Small tubular adenoma removed on colonoscopy 07/06/2019 Unintentional 20 pound weight loss since death of husband 03/20/19 Daily alcohol use  Ongoing symptoms and increased fecal calprotectin despite some clinical improvement on budesonide 9 mg daily. Recent labs show no hypokalemia, anemia, or thrombocytosis.  Her daughter will see if she can obtain UCERIS for a better prior through [facility]. Continue PeptoBismal three 262 mg tablets 3 times, loperamine, and Metamucil BID.  Will not taper budesonide until her symptoms are controlled.   I've recommended that she taper her alcohol use.   I've recommended that she reach out to Dr. Melford Aase or Estill Bamberg to discuss treatment of allergies and to review treatment for depression. Her daughter mentions that she has noticed more depression since her Mom stopped taking Lexapro.    PLAN: - Continue  budesonide 9 mg daily, will attempt a switch to [place] if able to afford the prescription - Continue PeptoBismal three 262 mg tablets 3 times daily - Continue Loperamide recommended PRN for ongoing diarrhea - Continue Metamucil BID - Stool for fecal calprotectin to monitor response to therapy in 1 month - Continue to avoid all NSAIDs as this may flare lymphocytic colitis - Follow-up in 8 weeks, or earlier as needed, to monitor response to therapy - Cut back on your daily alcohol and work to completely abstain - Add Creon-24,000 lipase, one to two capsules with meals and one capsule if diarrhea does not improve - Follow-up with Dr. Melford Aase re: allergy treatment options and antidepressents  HPI: Connie Alvarado is a 79 y.o. female under evaluation for diarrhea that initially developed after the death of her husband 03-20-19. She had an EGD and colonoscopy 07/06/19 and was diagnosed with lymphocytic colitis and treatment with budesonide was initiated and then tapered to 6 mg daily. In September 2020 her budesonide was increased back to 9 mg daily with ongoing diarrhea. PeptoBismal was added for persistent symptoms. Attempt switch from budesonide to [place] did not occur due to cost.  The interval history is obtained through the patient, her daughter who is present during the appointment,  and review of her electronic health record. 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 She continues to use alcohol daily.   Currently taking budesonide 42m in the morning. Imodium 2 in the morning and sometimes a second dose in the afternoon. Added bismuth subsalicylate three 2026mg tablets 3 times for persistent symptoms. However, her daughter reached out to me after this encounter to tell me  she thinks her Mom is taking budesonide and bismuth subsalicylate, but no the Imodium or the Metamucil.   Physically  doing better.  Still feeling depressed. Not taking Lexapro. Becomes tearful when I asked her about the upcoming anniversary of her husband's death.   Diarrhea is better with an improvement in frequency. Still having one BM at night. 1-2 BM daily. Had been having up to 8 watery, BM daily. Urgency has resolved. Stools are more formed.  No blood or mucous. Stools remain black on PeptoBismal.  Appetite continues to fluctuate. Weight is stable.   Continues to drink 2 glasses of wine every night.  However, her daughter reached out to me after this encounter to tell me she thinks her Mom is drinking 2/3 to 1 bottle of wine each night.     Will have the Covid vaccine next week.  She is asking for a recommendation for her allergies. Wants to use Advil allergies but remembers my recommendation to avoid NSAIDs. Tried Allergra without improvement.   Recent labs: 06/22/2019: CRP less than 1 and a sedimentation rate of 30. Stool studies 06/26/2019 showed a normal GI pathogen panel.  Fecal calprotectin was elevated at 679.  Pancreatic elastase 152. Labs 09/10/19: Na 139, K 4.0, glucose 108, BUN 12, creatinine 0.56 Stool studies 09/17/19: Fecal calprotectin 122 Stool studies 11/01/19: Fecal calprotectin 317 Labs 12/18/19: normal CMP including K 3.9, normal CBC including hemoglobin of 12.3 and platelets 174  Prior endoscopy: EGD 07/06/2019: gastritis, a small hiatal hernia, and was otherwise endoscopically normal.  Biopsies showed chronic gastritis without H. pylori, reflux, and normal duodenum.   Colonoscopy 07/06/2019: internal hemorrhoids, a 3 mm sigmoid tubular adenoma, and mild sigmoid and descending colon diverticulosis.  Random colon biopsies were consistent with lymphocytic colitis.  Recent abdominal imaging: A CT of the abdomen and pelvis with contrast 05/14/2019 shows cardiomegaly a nonobstructing stone in the upper pole of the left kidney, sigmoid diverticulosis without diverticulitis, no acute  intra-abdominal abnormality, and a 1.4 cm groundglass opacity at the right lung base and a 0.6 cm pulmonary nodule in the peripheral left lower lobe.  The radiologist recommended a noncontrasted chest CT in 6 months.    Past Medical History:  Diagnosis Date  . Anxiety   . Depression   . Hyperlipidemia   . Malaria    as a child    Past Surgical History:  Procedure Laterality Date  . [place]  . CARDIAC VALVE SURGERY  2016    Current Outpatient Medications  Medication Sig Dispense Refill  . atorvastatin (LIPITOR) 80 MG tablet Take 1 tablet (80 mg total) by mouth daily. 90 tablet 1  . bismuth subsalicylate (PEPTO BISMOL) 262 MG chewable tablet Chew 3 tablets (786 mg total) by mouth 3 (three) times daily. 270 tablet 1  . budesonide (ENTOCORT EC) 3 MG 24 hr capsule TAKE 3 CAPSULES(9 MG) BY MOUTH DAILY 90 capsule 1  . Budesonide ER (UCERIS) 9 MG TB24 Take 9 mg by mouth daily. 30 tablet 3  . gabapentin (NEURONTIN) 300 MG capsule Take 1 capsule (300 mg total) by mouth 3 (three) times daily. 90 capsule 2  . VITAMIN D PO Take 1 tablet by mouth daily.     No current facility-administered medications for this visit.    Allergies as of 01/15/2020  . (No Known Allergies)    Family History  Problem Relation Age of Onset  . Cancer Mother   . Hypertension Mother   . Hyperlipidemia Mother   .  Parkinson's disease Father   . Kidney disease Father   . Colon cancer Neg Hx   . Esophageal cancer Neg Hx   . Rectal cancer Neg Hx   . Stomach cancer Neg Hx     Social History   Socioeconomic History  . Marital status: Widowed    Spouse name: Not on file  . Number of children: 1  . Years of education: Not on file  . Highest education level: Not on file  Occupational History  . Not on file  Tobacco Use  . Smoking status: Former Smoker    Types: Cigarettes    Quit date: 1980    Years since quitting: 41.0  . Smokeless tobacco: Never Used  Substance and Sexual  Activity  . Alcohol use: Yes    Alcohol/week: 14.0 standard drinks    Types: 14 Glasses of wine per week    Comment: wine  . Drug use: Never  . Sexual activity: Not Currently  Other Topics Concern  . Not on file  Social History Narrative  . Not on file   Social Determinants of Health   Financial Resource Strain:   . Difficulty of Paying Living Expenses: Not on file  Food Insecurity:   . Worried About Charity fundraiser in the Last Year: Not on file  . Ran Out of Food in the Last Year: Not on file  Transportation Needs:   . Lack of Transportation (Medical): Not on file  . Lack of Transportation (Non-Medical): Not on file  Physical Activity:   . Days of Exercise per Week: Not on file  . Minutes of Exercise per Session: Not on file  Stress:   . Feeling of Stress : Not on file  Social Connections:   . Frequency of Communication with Friends and Family: Not on file  . Frequency of Social Gatherings with Friends and Family: Not on file  . Attends Religious Services: Not on file  . Active Member of Clubs or Organizations: Not on file  . Attends Archivist Meetings: Not on file  . Marital Status: Not on file  Intimate Partner Violence:   . Fear of Current or Ex-Partner: Not on file  . Emotionally Abused: Not on file  . Physically Abused: Not on file  . Sexually Abused: Not on file   Physical Exam: General: Awake, alert, and oriented, pleasant. In no acute distress.  HEENT: EOMI, non-icteric sclera, NCAT, MMM  Neck: Normal movement of head and neck  Pulm: No labored breathing, speaking in full sentences without conversational dyspnea  Abdomen: Soft, NT, ND, normal bowel sounds, without rebound or guarding Derm: No obvious rash or bruise  MS: Moves all visible extremities without noticeable abnormality  Psych: Pleasant, cooperative, normal speech, normal affect and normal insight Neuro: Alert and appropriate   Jeffey Janssen L. Tarri Glenn, MD, MPH [facility] 01/15/2020, 3:27 PM

## 2020-01-21 DIAGNOSIS — M545 Low back pain: Secondary | ICD-10-CM | POA: Diagnosis not present

## 2020-01-22 ENCOUNTER — Other Ambulatory Visit: Payer: Self-pay | Admitting: Family Medicine

## 2020-01-22 DIAGNOSIS — M5440 Lumbago with sciatica, unspecified side: Secondary | ICD-10-CM

## 2020-01-22 NOTE — Progress Notes (Signed)
Patient has poor memory/recall, especially when she was drinking, history from daughter, lauren as well.   Assessment and Plan:  Chronic nonintractable headache, unspecified headache type -     CT Head Wo Contrast; Future - with recent fall, dizziness, headache will get a CT head to rule out [facility] She was informed to call 911 if she develop any new symptoms such as worsening headaches, episodes of blurred vision, double vision or complete loss of vision or speech difficulties or motor weakness.  Depression, major, recurrent, in partial remission (HCC) -     gabapentin (NEURONTIN) 300 MG capsule; Take 1 capsule (300 mg total) by mouth 3 (three) times daily. 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 Screening labs and tests as requested with regular follow-up as recommended. Over 60 minutes of exam, counseling, chart review, and complex, high level critical decision making was performed this visit.  Future Appointments  Date Time Provider [place]  02/06/2020 11:30 AM [name], MD [facility] None  04/17/2020  2:30 PM Vicie Mutters, PA-C [facility] None    HPI  79 y.o. 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   She states she was watching the inauguration and admits that she drank too much wine, she fell that night out of bed, daughter states she was downstairs when she heard a loud boom, she went up and her mother was disoriented when she went into the room, she states she tried to go to bathroom and her daughter states she "passed out" and feel backwards. The patient does not remember much and is not a good historian  She is motivated to stop wine due to recent falls, she was started on gabapentin. She has not had wine for 7 days, no withdrawal symptoms or DTs at this time.   Back pain center of her back made worse by trying to sit up from the bed, has improved some with movement modification. She went to [place] for her back pain, they want a CT to rule out compression fracture.   She has been having diarrhea.  She was found to have lymphocytic colitis and is following with GI, on budesonide. Since not drinking her bowels have improved  She is on lexapro 5 mg for depression from loss of her husband, move, and pandemic, (switched from zoloft and celexa).  BMI is Body mass index is 20.19 kg/m. Wt Readings from Last 8 Encounters:  01/23/20 117 lb 9.6 oz (53.3 kg)  12/18/19 113 lb 9.6 oz (51.5 kg)  11/30/19 113 lb (51.3 kg)  10/22/19 112 lb (50.8 kg)  09/10/19 113 lb (51.3 kg)  07/06/19 109  lb (49.4 kg)  07/03/19 111 lb (50.3 kg)  05/02/19 117 lb 9.6 oz (53.3 kg)   Her blood pressure has been controlled at home, today their BP is BP: 118/70   Lab Results  Component Value Date   GFRNONAA 85 12/18/2019    Current Medications:  Current Outpatient Medications on File Prior to Visit  Medication Sig Dispense Refill  . atorvastatin (LIPITOR) 80 MG tablet Take 1 tablet (80 mg total) by mouth daily. 90 tablet 1  . Budesonide ER (UCERIS) 9 MG TB24 Take 9 mg  by mouth daily. 30 tablet 3  . escitalopram (LEXAPRO) 10 MG tablet 10 mg.    . gabapentin (NEURONTIN) 300 MG capsule Take 1 capsule (300 mg total) by mouth 3 (three) times daily. 90 capsule 2  . VITAMIN D PO Take 1 tablet by mouth daily.     No current facility-administered medications on file prior to visit.   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  Will request complete records from previous doctors  Surgical History:  She has a past surgical history that includes Cardiac valuve replacement (1999) and Cardiac valuve replacement (2016). Family History:  Herfamily history includes Cancer in her mother; Hyperlipidemia in her mother; Hypertension in her mother; Kidney disease in her father; Parkinson's disease in her father. Social History:  She reports that she quit smoking about 41 years ago. Her smoking use included cigarettes. She has never used smokeless tobacco. She reports current alcohol use of about 14.0 standard drinks of alcohol per week. She reports that she does not use drugs.   Review of Systems: Review of Systems  Constitutional: Positive for malaise/fatigue. Negative for chills, diaphoresis, fever and weight loss.  HENT: Negative.   Eyes: Negative.   Respiratory: Negative.   Cardiovascular: Negative.   Gastrointestinal: Positive for diarrhea.  Genitourinary: Negative.   Musculoskeletal: Positive for back pain, falls and joint pain. Negative for myalgias and neck pain.  Skin: Negative.   Neurological: Positive for dizziness, loss of consciousness and headaches. Negative for tingling, tremors, sensory change, speech change, focal weakness,  seizures and weakness.  Endo/Heme/Allergies: Negative.   Psychiatric/Behavioral: Positive for depression and memory loss. Negative for hallucinations, substance abuse and suicidal ideas. The patient is not nervous/anxious and does not have insomnia.      Physical Exam: Estimated body mass index is 20.19 kg/m as calculated from the following:   Height as of 11/30/19: 5\' 4"  (1.626 m).   Weight as of this encounter: 117 lb 9.6 oz (53.3 kg). BP 118/70   Pulse 65   Temp (!) 97.5 F (36.4 C)   Wt 117 lb 9.6 oz (53.3 kg)   SpO2 97%   BMI 20.19 kg/m  General Appearance: elderly frail appearing female in no apparent distress Eyes: PERRLA, EOMs, conjunctiva no swelling or erythema Sinuses: No Frontal/maxillary tenderness ENT/Mouth: Ext aud canals clear, normal light reflex with TMs without erythema, bulging.  No erythema, swelling, or exudate on post pharynx. Tonsils not swollen or erythematous. Hearing good Neck: Supple, thyroid normal. No bruits  Respiratory: Respiratory effort normal, BS equal bilaterally without rales, rhonchi, wheezing or stridor.  Cardio: RRR with 2/6 systolic murmur without rubs or gallops.  Chest: symmetric, with normal excursions and percussion.  Abdomen: Soft, nontender, no guarding, rebound, hernias, masses, or organomegaly.  Lymphatics: Non tender without lymphadenopathy.  Musculoskeletal: Full ROM all peripheral extremities, good grip strength, antalgic, unsteady gait.  Skin: Warm, dry without rashes, lesions, ecchymosis. Neuro: Cranial nerves intact,,no cog wheeling, Normal muscle tone, wide based stance,   Psych: Awake and oriented X 3, normal affect, Insight and Judgment appropriate    Vicie Mutters 12:28 PM [facility]

## 2020-01-23 ENCOUNTER — Encounter: Payer: Self-pay | Admitting: Physician Assistant

## 2020-01-23 ENCOUNTER — Ambulatory Visit (INDEPENDENT_AMBULATORY_CARE_PROVIDER_SITE_OTHER): Payer: Medicare Other | Admitting: Physician Assistant

## 2020-01-23 ENCOUNTER — Other Ambulatory Visit: Payer: Self-pay

## 2020-01-23 VITALS — BP 118/70 | HR 65 | Temp 97.5°F | Wt 117.6 lb

## 2020-01-23 DIAGNOSIS — F3341 Major depressive disorder, recurrent, in partial remission: Secondary | ICD-10-CM | POA: Diagnosis not present

## 2020-01-23 DIAGNOSIS — G8929 Other chronic pain: Secondary | ICD-10-CM

## 2020-01-23 DIAGNOSIS — R519 Headache, unspecified: Secondary | ICD-10-CM | POA: Diagnosis not present

## 2020-01-23 DIAGNOSIS — F1019 Alcohol abuse with unspecified alcohol-induced disorder: Secondary | ICD-10-CM | POA: Diagnosis not present

## 2020-01-23 NOTE — Patient Instructions (Addendum)
Keep on the gabapentin twice a day for pain, anxiety and for alcohol withdrawal- after 1 month can try to stop the gabapentin  She was informed to call 911 if she develop any new symptoms such as worsening headaches, episodes of blurred vision, double vision or complete loss of vision or speech difficulties or motor weakness.   Alcohol Use Disorder Alcohol use disorder is when your drinking disrupts your daily life. When you have this condition, you drink too much alcohol and you cannot control your drinking. Alcohol use disorder can cause serious problems with your physical health. It can affect your brain, heart, liver, pancreas, immune system, stomach, and intestines. Alcohol use disorder can increase your risk for certain cancers and cause problems with your mental health, such as depression, anxiety, psychosis, delirium, and dementia. People with this disorder risk hurting themselves and others. What are the causes? This condition is caused by drinking too much alcohol over time. It is not caused by drinking too much alcohol only one or two times. Some people with this condition drink alcohol to cope with or escape from negative life events. Others drink to relieve pain or symptoms of mental illness. What increases the risk? You are more likely to develop this condition if:  You have a family history of alcohol use disorder.  Your culture encourages drinking to the point of intoxication, or makes alcohol easy to get.  You had a mood or conduct disorder in childhood.  You have been a victim of abuse.  You are an adolescent and: ? You have poor grades or difficulties in school. ? Your caregivers do not talk to you about saying no to alcohol, or supervise your activities. ? You are impulsive or you have trouble with self-control. What are the signs or symptoms? Symptoms of this condition include:  Drinkingmore than you want to.  Drinking for longer than you want to.  Trying several  times to drink less or to control your drinking.  Spending a lot of time getting alcohol, drinking, or recovering from drinking.  Craving alcohol.  Having problems at work, at school, or at home due to drinking.  Having problems in relationships due to drinking.  Drinking when it is dangerous to drink, such as before driving a car.  Continuing to drink even though you know you might have a physical or mental problem related to drinking.  Needing more and more alcohol to get the same effect you want from the alcohol (building up tolerance).  Having symptoms of withdrawal when you stop drinking. Symptoms of withdrawal include: ? Fatigue. ? Nightmares. ? Trouble sleeping. ? Depression. ? Anxiety. ? Fever. ? Seizures. ? Severe confusion. ? Feeling or seeing things that are not there (hallucinations). ? Tremors. ? Rapid heart rate. ? Rapid breathing. ? High blood pressure.  Drinking to avoid symptoms of withdrawal. How is this diagnosed? This condition is diagnosed with an assessment. Your health care provider may start the assessment by asking three or four questions about your drinking. Your health care provider may perform a physical exam or do lab tests to see if you have physical problems resulting from alcohol use. She or he may refer you to a mental health professional for evaluation. How is this treated? Some people with alcohol use disorder are able to reduce their alcohol use to low-risk levels. Others need to completely quit drinking alcohol. When necessary, mental health professionals with specialized training in substance use treatment can help. Your health care provider can  help you decide how severe your alcohol use disorder is and what type of treatment you need. The following forms of treatment are available:  Detoxification. Detoxification involves quitting drinking and using prescription medicines within the first week to help lessen withdrawal symptoms. This  treatment is important for people who have had withdrawal symptoms before and for heavy drinkers who are likely to have withdrawal symptoms. Alcohol withdrawal can be dangerous, and in severe cases, it can cause death. Detoxification may be provided in a home, community, or primary care setting, or in a hospital or substance use treatment facility.  Counseling. This treatment is also called talk therapy. It is provided by substance use treatment counselors. A counselor can address the reasons you use alcohol and suggest ways to keep you from drinking again or to prevent problem drinking. The goals of talk therapy are to: ? Find healthy activities and ways for you to cope with stress. ? Identify and avoid the things that trigger your alcohol use. ? Help you learn how to handle cravings.  Medicines.Medicines can help treat alcohol use disorder by: ? Decreasing alcohol cravings. ? Decreasing the positive feeling you have when you drink alcohol. ? Causing an uncomfortable physical reaction when you drink alcohol (aversion therapy).  Support groups. Support groups are led by people who have quit drinking. They provide emotional support, advice, and guidance. These forms of treatment are often combined. Some people with this condition benefit from a combination of treatments provided by specialized substance use treatment centers. Follow these instructions at home:  Take over-the-counter and prescription medicines only as told by your health care provider.  Check with your health care provider before starting any new medicines.  Ask friends and family members not to offer you alcohol.  Avoid situations where alcohol is served, including gatherings where others are drinking alcohol.  Create a plan for what to do when you are tempted to use alcohol.  Find hobbies or activities that you enjoy that do not include alcohol.  Keep all follow-up visits as told by your health care provider. This is  important. How is this prevented?  If you drink, limit alcohol intake to no more than 1 drink a day for nonpregnant women and 2 drinks a day for men. One drink equals 12 oz of beer, 5 oz of wine, or 1 oz of hard liquor.  If you have a mental health condition, get treatment and support.  Do not give alcohol to adolescents.  If you are an adolescent: ? Do not drink alcohol. ? Do not be afraid to say no if someone offers you alcohol. Speak up about why you do not want to drink. You can be a positive role model for your friends and set a good example for those around you by not drinking alcohol. ? If your friends drink, spend time with others who do not drink alcohol. Make new friends who do not use alcohol. ? Find healthy ways to manage stress and emotions, such as meditation or deep breathing, exercise, spending time in nature, listening to music, or talking with a trusted friend or family member. Contact a health care provider if:  You are not able to take your medicines as told.  Your symptoms get worse.  You return to drinking alcohol (relapse) and your symptoms get worse. Get help right away if:  You have thoughts about hurting yourself or others. If you ever feel like you may hurt yourself or others, or have thoughts about taking  your own life, get help right away. You can go to your nearest emergency department or call:  Your local emergency services (911 in the [place]  A suicide crisis helpline, such as the [facility] at 705-759-0219. This is open 24 hours a day. Summary  Alcohol use disorder is when your drinking disrupts your daily life. When you have this condition, you drink too much alcohol and you cannot control your drinking.  Treatment may include detoxification, counseling, medicine, and support groups.  Ask friends and family members not to offer you alcohol. Avoid situations where alcohol is served.  Get help right away if you have  thoughts about hurting yourself or others. This information is not intended to replace advice given to you by your health care provider. Make sure you discuss any questions you have with your health care provider. Document Revised: 11/25/2017 Document Reviewed: 09/09/2016 Elsevier Patient Education  2020 [employer].   Alcohol Withdrawal Syndrome Alcohol withdrawal syndrome is a group of symptoms that can develop when a person who drinks heavily and regularly stops drinking or drinks less. Alcohol withdrawal syndrome can be mild or severe, and it may even be life-threatening. Alcohol withdrawal syndrome usually affects people who have alcohol use disorder, which may also be called alcoholism. Alcohol use disorder is when a person is unable to control his or her alcohol use, and drinking too much or too often causes problems at home, at work, or in relationships. What are the causes? Drinking heavily and drinking on a regular basis cause changes in brain chemistry. Over time, the body becomes dependent on alcohol. When alcohol use stops, the chemistry system in the brain becomes unbalanced and causes the symptoms of alcohol withdrawal. What increases the risk? Alcohol withdrawal syndrome is more likely to occur in people who drink more than the recommended limit of alcohol (2 drinks a day for men or 1 drink a day for non-pregnant women). It is also more likely to affect heavy drinkers who have been using alcohol for long periods of time. The more a person drinks and the longer he or she drinks, the greater the risk of alcohol withdrawal syndrome. Severe withdrawal is more likely to develop in someone who:  Had severe alcohol withdrawal in the past.  Had a seizure during a previous episode of alcohol withdrawal.  Is elderly.  Uses other drugs.  Has a long-term (chronic) medical problem, such as heart, lung, or liver disease.  Has depression.  Does not get enough nutrients from his or her  diet (malnutrition). What are the signs or symptoms? Symptoms of this condition can be mild to moderate, or they can be severe. Symptoms may develop a few hours (or up to a day) after a person changes his or her drinking patterns. During the 48 hours after he or she has stopped drinking, the following symptoms may go away or get better:  Uncontrollable shaking (tremor).  Sweating.  Headache.  Anxiety.  Inability to relax (agitation).  Trouble sleeping (insomnia).  Irregular heartbeats (palpitations).  Alcohol cravings.  Seizure. The following symptoms may get worse 24-48 hours after a person has decreased or stopped alcohol use, and they may gradually improve over a period of days or weeks:  Nausea and vomiting.  Fatigue.  Sensitivity to light and sounds.  Confusion and inability to think clearly.  Loss of appetite.  Mood swings, irritability, depression, and anxiety.  Insomnia and nightmares. The following symptoms are severe and life-threatening. When these symptoms occur  together, they are called delirium tremens (DTs):  High blood pressure.  Increased heart rate.  Trouble breathing.  Seizures. These may go away along with other symptoms, or they may persist.  Seeing, hearing, feeling, smelling, or tasting things that are not there (hallucinations). If you experience hallucinations, they usually begin 12-24 hours after a change in drinking patterns. Delirium tremens requires immediate hospitalization. How is this diagnosed? This condition may be diagnosed based on:  Your symptoms and medical history.  Your history of alcohol use. Your health care provider may ask questions about your drinking behavior. It is important to be honest when you answer these questions.  A psychological assessment.  A physical exam.  Blood tests or urine tests to measure blood alcohol level and to rule out other causes of symptoms.  MRI or CT scan. This may be done if you seem  to have abnormal thinking or behaviors (altered mental status). Diagnosis can be difficult. People going through withdrawal often avoid seeking medical care and are not thinking clearly. Friends and family members play an important role in recognizing symptoms and encouraging loved ones to get treatment. How is this treated? Most people with symptoms of withdrawal can be treated outside of a hospital setting (outpatient treatment), with close monitoring such as daily check-ins with a health care provider and counseling. You may need treatment at a hospital or treatment center (inpatient treatment) if:  You have a history of delirium tremens or seizures.  You have severe symptoms.  You are addicted to other drugs.  You cannot swallow medicine.  You have a serious medical condition such as heart failure.  You experienced withdrawal in the past but then you continued drinking alcohol.  You are not likely to commit to an outpatient treatment schedule. Treatment may involve:  Monitoring your blood pressure, pulse, and breathing.  IV fluids to keep you hydrated.  Medicines to reduce withdrawal symptoms and discomfort (benzodiazepines).  Medicine to reduce anxiety.  Medicine to prevent or control seizures.  Multivitamins and B vitamins.  Having a health care provider check on you daily. It is important to get treatment for alcohol withdrawal early. Getting treatment early can:  Speed up your recovery from withdrawal symptoms.  Make you more successful with long-term stoppage of alcohol use (sobriety). If you need help to stop drinking, your health care provider may recommend a long-term treatment plan that includes:  Medicines to help treat alcohol use disorder.  Substance abuse counseling.  Support groups. Follow these instructions at home:   Take over-the-counter and prescription medicines (including vitamin supplements) only as told by your health care provider.  Do not  drink alcohol.  Do not drive until your health care provider approves.  Have someone you trust stay with you or be available if you need help with your symptoms or with not drinking.  Drink enough fluid to keep your urine pale yellow.  Consider joining an alcohol support group or treatment program. These can provide emotional support, advice, and guidance.  Keep all follow-up visits as told by your health care provider. This is important. Contact a health care provider if:  Your symptoms get worse instead of better.  You cannot eat or drink without vomiting.  You are struggling with not drinking alcohol.  You cannot stop drinking alcohol. Get help right away if:  You have an irregular heartbeat.  You have chest pain.  You have trouble breathing.  You have a seizure for the first time.  You hallucinate.  You become very confused. Summary  Alcohol withdrawal is a group of symptoms that can develop when a person who drinks heavily and regularly stops drinking or drinks less.  Symptoms of this condition can be mild to moderate, or they can be severe.  Treatment may include hospitalization, medicine, and counseling. This information is not intended to replace advice given to you by your health care provider. Make sure you discuss any questions you have with your health care provider. Document Revised: 11/25/2017 Document Reviewed: 08/19/2017 Elsevier Patient Education  [employer].

## 2020-01-25 ENCOUNTER — Ambulatory Visit
Admission: RE | Admit: 2020-01-25 | Discharge: 2020-01-25 | Disposition: A | Discharge status: Home / Self Care | Payer: Medicare Other | Source: Ambulatory Visit | Attending: Family Medicine | Admitting: Family Medicine

## 2020-01-25 ENCOUNTER — Ambulatory Visit
Admission: RE | Admit: 2020-01-25 | Discharge: 2020-01-25 | Disposition: A | Discharge status: Home / Self Care | Payer: Medicare Other | Source: Ambulatory Visit | Attending: Physician Assistant | Admitting: Physician Assistant

## 2020-01-25 DIAGNOSIS — R519 Headache, unspecified: Secondary | ICD-10-CM

## 2020-01-25 DIAGNOSIS — M5440 Lumbago with sciatica, unspecified side: Secondary | ICD-10-CM

## 2020-01-25 DIAGNOSIS — S0990XA Unspecified injury of head, initial encounter: Secondary | ICD-10-CM | POA: Diagnosis not present

## 2020-01-25 DIAGNOSIS — M545 Low back pain: Secondary | ICD-10-CM | POA: Diagnosis not present

## 2020-02-04 DIAGNOSIS — M545 Low back pain: Secondary | ICD-10-CM | POA: Diagnosis not present

## 2020-02-06 ENCOUNTER — Encounter: Payer: Self-pay | Admitting: Diagnostic Neuroimaging

## 2020-02-06 ENCOUNTER — Other Ambulatory Visit: Payer: Self-pay

## 2020-02-06 ENCOUNTER — Ambulatory Visit (INDEPENDENT_AMBULATORY_CARE_PROVIDER_SITE_OTHER): Payer: Medicare Other | Admitting: Diagnostic Neuroimaging

## 2020-02-06 VITALS — BP 99/69 | HR 86 | Temp 97.3°F | Ht 64.0 in | Wt 115.0 lb

## 2020-02-06 DIAGNOSIS — R413 Other amnesia: Secondary | ICD-10-CM

## 2020-02-06 NOTE — Progress Notes (Signed)
[facility]  PATIENT: Metta Pingree DOB: 21-Feb-1941  REFERRING CLINICIAN: Unk Pinto, MD HISTORY FROM: patient and daughter  REASON FOR VISIT: new consult    HISTORICAL  CHIEF COMPLAINT:  Chief Complaint  Patient presents with  . Memory Loss    rm 7 New pt, dgtr- Lauren   MMSE 26  . Tremors    HISTORY OF PRESENT ILLNESS:   79 year old female here for evaluation of memory loss.  Patient denies any significant memory loss problems.  She does have some mild memory loss problems but she feels like she is able to do all of her day-to-day functioning without difficulty.  She has had a stressful year since her husband passed away in 2019/03/17.  Also she moved from [place] to [place] in 2019 for medical care for her husband.  Since that time she has sold her house in [place] and stayed in [place] to live with her daughter.  Patient has struggled with depression and alcohol abuse.  Patient has been drinking up to 1 bottle of wine per day for the past few years.  This gradually increased when patient retired in her 66s.  Daughter notes more problems with short-term memory loss, difficulty operating her phone, difficulty understanding complex financial transactions, decreased motivation and multitasking.  Initially daughter thought this was related to alcohol abuse but in times where patient has not been drinking, patient continued to have these memory problems.   01/16/2020 patient had excessive alcohol use, stumbled and fell down.  She went to the emergency room for evaluation.  Since that time she has not had any alcohol use.  Memory symptoms are stable.   REVIEW OF SYSTEMS: Full 14 system review of systems performed and negative with exception of: As per HPI.  ALLERGIES: No Known Allergies  HOME MEDICATIONS: Outpatient Medications Prior to Visit  Medication Sig Dispense Refill  . acetaminophen (TYLENOL) 325 MG tablet Take 650 mg by  mouth every 6 (six) hours as needed.    Marland Kitchen atorvastatin (LIPITOR) 80 MG tablet Take 1 tablet (80 mg total) by mouth daily. 90 tablet 1  . bismuth subsalicylate (PEPTO BISMOL) 262 MG/15ML suspension Take 30 mLs by mouth every 6 (six) hours as needed.    . Budesonide ER (UCERIS) 9 MG TB24 Take 9 mg by mouth daily. 30 tablet 3  . diphenhydrAMINE (BENADRYL) 25 MG tablet Take 25 mg by mouth every 6 (six) hours as needed.    Marland Kitchen escitalopram (LEXAPRO) 10 MG tablet 10 mg.    . gabapentin (NEURONTIN) 300 MG capsule Take 1 capsule (300 mg total) by mouth 3 (three) times daily. 90 capsule 2  . VITAMIN D PO Take 1 tablet by mouth daily.     No facility-administered medications prior to visit.    PAST MEDICAL HISTORY: Past Medical History:  Diagnosis Date  . Anxiety   . Depression   . Hyperlipidemia   . Lymphocytic colitis   . Malaria    as a child  . Memory loss     PAST SURGICAL HISTORY: Past Surgical History:  Procedure Laterality Date  . [place]  . CARDIAC VALVE SURGERY  2016    FAMILY HISTORY: Family History  Problem Relation Age of Onset  . Cancer Mother        breast  . Hypertension Mother   . Hyperlipidemia Mother   . Parkinson's disease Father   . Kidney disease Father   . Parkinsonism Father   .  Colon cancer Neg Hx   . Esophageal cancer Neg Hx   . Rectal cancer Neg Hx   . Stomach cancer Neg Hx     SOCIAL HISTORY: Social History   Socioeconomic History  . Marital status: Widowed    Spouse name: Not on file  . Number of children: 1  . Years of education: Not on file  . Highest education level: Bachelor's degree (e.g., BA, AB, BS)  Occupational History    Comment: retired  Tobacco Use  . Smoking status: Former Smoker    Types: Cigarettes    Quit date: 1980    Years since quitting: 41.1  . Smokeless tobacco: Never Used  Substance and Sexual Activity  . Alcohol use: Yes    Alcohol/week: 14.0 standard drinks    Types: 14 Glasses of wine per  week    Comment:  02/06/20 none since fall in Jan   . Drug use: Never  . Sexual activity: Not Currently  Other Topics Concern  . Not on file  Social History Narrative   02/06/20 lives with dgtr, [name]   Caffeine- coffee 2-3 c daily   Social Determinants of Health   Financial Resource Strain:   . Difficulty of Paying Living Expenses: Not on file  Food Insecurity:   . Worried About Charity fundraiser in the Last Year: Not on file  . Ran Out of Food in the Last Year: Not on file  Transportation Needs:   . Lack of Transportation (Medical): Not on file  . Lack of Transportation (Non-Medical): Not on file  Physical Activity:   . Days of Exercise per Week: Not on file  . Minutes of Exercise per Session: Not on file  Stress:   . Feeling of Stress : Not on file  Social Connections:   . Frequency of Communication with Friends and Family: Not on file  . Frequency of Social Gatherings with Friends and Family: Not on file  . Attends Religious Services: Not on file  . Active Member of Clubs or Organizations: Not on file  . Attends Archivist Meetings: Not on file  . Marital Status: Not on file  Intimate Partner Violence:   . Fear of Current or Ex-Partner: Not on file  . Emotionally Abused: Not on file  . Physically Abused: Not on file  . Sexually Abused: Not on file     PHYSICAL EXAM  GENERAL EXAM/CONSTITUTIONAL: Vitals:  Vitals:   02/06/20 1111  BP: 99/69  Pulse: 86  Temp: (!) 97.3 F (36.3 C)  Weight: 115 lb (52.2 kg)  Height: 5\' 4"  (1.626 m)     Body mass index is 19.74 kg/m. Wt Readings from Last 3 Encounters:  02/06/20 115 lb (52.2 kg)  01/23/20 117 lb 9.6 oz (53.3 kg)  12/18/19 113 lb 9.6 oz (51.5 kg)     Patient is in no distress; well developed, nourished and groomed; neck is supple  CARDIOVASCULAR:  Examination of carotid arteries is normal; no carotid bruits  Regular rate and rhythm, no murmurs  Examination of peripheral vascular system by  observation and palpation is normal  EYES:  Ophthalmoscopic exam of optic discs and posterior segments is normal; no papilledema or hemorrhages  No exam data present  MUSCULOSKELETAL:  Gait, strength, tone, movements noted in Neurologic exam below  NEUROLOGIC: MENTAL STATUS:  MMSE - Mini Mental State Exam 02/06/2020  Orientation to time 3  Orientation to Place 4  Registration 3  Attention/ Calculation 4  Recall  3  Language- name 2 objects 2  Language- repeat 1  Language- follow 3 step command 3  Language- read & follow direction 1  Write a sentence 1  Copy design 1  Total score 26    awake, alert, oriented to person, place and time  recent and remote memory intact  normal attention and concentration  language fluent, comprehension intact, naming intact  fund of knowledge appropriate  CRANIAL NERVE:   2nd - no papilledema on fundoscopic exam  2nd, 3rd, 4th, 6th - pupils equal and reactive to light, visual fields full to confrontation, extraocular muscles intact, no nystagmus  5th - facial sensation symmetric  7th - facial strength symmetric  8th - hearing intact  9th - palate elevates symmetrically, uvula midline  11th - shoulder shrug symmetric  12th - tongue protrusion midline  MOTOR:   normal bulk and tone, full strength in the BUE, BLE  SENSORY:   normal and symmetric to light touch, temperature, vibration  COORDINATION:   finger-nose-finger, fine finger movements normal  REFLEXES:   deep tendon reflexes present and symmetric  GAIT/STATION:   narrow based gait    DIAGNOSTIC DATA (LABS, IMAGING, TESTING) - I reviewed patient records, labs, notes, testing and imaging myself where available.  Lab Results  Component Value Date   WBC 7.2 12/18/2019   HGB 12.3 12/18/2019   HCT 37.5 12/18/2019   MCV 93.8 12/18/2019   PLT 174 12/18/2019      Component Value Date/Time   NA 143 12/18/2019 1630   K 3.9 12/18/2019 1630   CL 106  12/18/2019 1630   CO2 30 12/18/2019 1630   GLUCOSE 136 (H) 12/18/2019 1630   BUN 22 12/18/2019 1630   CREATININE 0.63 12/18/2019 1630   CALCIUM 9.2 12/18/2019 1630   PROT 6.5 12/18/2019 1630   AST 31 12/18/2019 1630   ALT 5 (L) 12/18/2019 1630   BILITOT 0.5 12/18/2019 1630   GFRNONAA 85 12/18/2019 1630   GFRAA 99 12/18/2019 1630   Lab Results  Component Value Date   CHOL 217 (H) 12/18/2019   HDL 96 12/18/2019   LDLCALC 102 (H) 12/18/2019   TRIG 91 12/18/2019   CHOLHDL 2.3 12/18/2019   Lab Results  Component Value Date   HGBA1C 5.6 10/22/2019   Lab Results  Component Value Date   VITAMINB12 628 03/08/2019   Lab Results  Component Value Date   TSH 1.68 12/18/2019    05/17/19 MRI brain [I reviewed images myself and agree with interpretation. -VRP]  - Generalized atrophy, not unexpected for age. - Moderately advanced T2 and FLAIR hyperintensities in the white matter consistent with chronic microvascular ischemic change. - No acute intracranial findings. No concerning abnormal postcontrast enhancement. 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  01/25/20 CT lumbar spine [I reviewed images myself and agree with interpretation. -VRP]  1. Findings consistent with a very tiny compression fracture of the anterior superior aspect of the T12 vertebral body, new since 05/14/2019. 2. 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 3. Mild to moderate spinal stenosis at L3-4, unchanged. 4. 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   ASSESSMENT AND PLAN  79 y.o. year old female here with 1 to 2 years of short-term memory loss, difficulty with learning new tasks, decreased motivation, decreased interest, also in the setting of depression and alcohol abuse.  Dx:  1. Memory loss     PLAN:  MEMORY LOSS (may  be related to depression, alcohol abuse; possible onset of mild neurodegenerative dementia is possible) - improve nutrition, exercise, mental stimulation - safety / supervision issues reviewed - caregiver resources provided - caution with driving and finances  Return for pending if symptoms worsen or fail to improve.    [name], MD [id number], [id number] AM Certified in Neurology, Neurophysiology and Neuroimaging  [facility] [address], [place], [place] [address] [phone]

## 2020-02-06 NOTE — Patient Instructions (Signed)
MEMORY LOSS (may be related to depression, alcohol abuse; possible neurodegenerative dementia) - improve nutrition, exercise, mental stimulation - safety / supervision issues reviewed - caregiver resources provided - caution with driving and finances

## 2020-03-03 DIAGNOSIS — M545 Low back pain: Secondary | ICD-10-CM | POA: Diagnosis not present

## 2020-03-19 MED ORDER — AMOXICILLIN 500 MG PO CAPS: ORAL_CAPSULE | ORAL | 1 refills | Status: DC

## 2020-03-25 ENCOUNTER — Other Ambulatory Visit: Payer: Self-pay | Admitting: Physician Assistant

## 2020-03-25 DIAGNOSIS — F1019 Alcohol abuse with unspecified alcohol-induced disorder: Secondary | ICD-10-CM

## 2020-04-09 ENCOUNTER — Other Ambulatory Visit: Payer: Self-pay

## 2020-04-09 ENCOUNTER — Encounter: Payer: Self-pay | Admitting: Cardiovascular Disease

## 2020-04-09 ENCOUNTER — Ambulatory Visit (INDEPENDENT_AMBULATORY_CARE_PROVIDER_SITE_OTHER): Payer: Medicare Other | Admitting: Cardiovascular Disease

## 2020-04-09 VITALS — BP 120/78 | HR 80 | Temp 96.8°F | Ht 64.0 in | Wt 120.0 lb

## 2020-04-09 DIAGNOSIS — I951 Orthostatic hypotension: Secondary | ICD-10-CM | POA: Diagnosis not present

## 2020-04-09 DIAGNOSIS — Z952 Presence of prosthetic heart valve: Secondary | ICD-10-CM

## 2020-04-09 DIAGNOSIS — E7800 Pure hypercholesterolemia, unspecified: Secondary | ICD-10-CM | POA: Diagnosis not present

## 2020-04-09 NOTE — Patient Instructions (Signed)
Medication Instructions:  No changes *If you need a refill on your cardiac medications before your next appointment, please call your pharmacy*   Lab Work: None ordered If you have labs (blood work) drawn today and your tests are completely normal, you will receive your results only by: MyChart Message (if you have MyChart) OR A paper copy in the mail If you have any lab test that is abnormal or we need to change your treatment, we will call you to review the results.   Testing/Procedures: Your physician has requested that you have an echocardiogram. Echocardiography is a painless test that uses sound waves to create images of your heart. It provides your doctor with information about the size and shape of your heart and how well your heart's chambers and valves are working. You may receive an ultrasound enhancing agent through an IV if needed to better visualize your heart during the echo.This procedure takes approximately one hour. There are no restrictions for this procedure. This will take place at the [address], [address].    Follow-Up: At [facility], you and your health needs are our priority.  As part of our continuing mission to provide you with exceptional heart care, we have created designated Provider Care Teams.  These Care Teams include your primary Cardiologist (physician) and Advanced Practice Providers (APPs -  Physician Assistants and Nurse Practitioners) who all work together to provide you with the care you need, when you need it.  We recommend signing up for the patient portal called "MyChart".  Sign up information is provided on this After Visit Summary.  MyChart is used to connect with patients for Virtual Visits (Telemedicine).  Patients are able to view lab/test results, encounter notes, upcoming appointments, etc.  Non-urgent messages can be sent to your provider as well.   To learn more about what you can do with MyChart, go to [email or web address].     Your next appointment:   12 month(s)  The format for your next appointment:   In Person  Provider:   You may see Mihai Croitoru, MD or one of the following Advanced Practice Providers on your designated Care Team:   Hao Meng, PA-C Angela Duke, PA-C or  Krista Kroeger, PA-C   

## 2020-04-09 NOTE — Progress Notes (Signed)
Cardiology office note   Date:  04/09/2020   ID:  Connie Alvarado, DOB 1941/12/03, MRN [id number]  Patient Location: Home Provider Location: Office  PCP:  Unk Pinto, MD  Cardiologist:  [place] [facility] Electrophysiologist:  None   Evaluation Performed: Follow-up  Chief Complaint:  Aortic valve disease (s/p AVR, increased gradients)  History of Present Illness:    Connie Alvarado is a 79 y.o. 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  Overall she seems to be doing better.  Although she is still frequently anxious and sad since Nori Riis passed away about a year ago her weight loss has stabilized and she appears less depressed.  She tries to walk in the neighborhood.  She can walk up and down the stairs in their home 10 times a day without complaints of shortness of breath, chest pain or dizziness.  On the other hand she often develops dizziness when she bends over to pick up limbs in the yard and sometimes randomly while standing up.  The complaint is inconsistent with some days better than others.  The lightheadedness is brief and resolves spontaneously.  It sometimes occurs on days when her family believes that the patient is well-hydrated.  She does not have any other neurological complaints.  When checked her blood pressure has been consistently in the normal range (although by the time she checks her blood pressure her symptoms have resolved).  She does not take any antihypertensive medications or diuretics but she drinks as much as 4 cups of coffee and 2 glasses of wine a day.  Her daughter encourages her to drink more water.  Echo on June 25 20 shows a very slight increase in AVR gradients when compared with an echocardiogram performed on July 03, 2018 at [facility] in [place].  In 2019 the peak and mean  gradients were 39 and 20 mmHg respectively, now the calculated gradients are 38 and 25 mmHg respectively.  The effective orifice area has decreased from 1.34 cm in 2019 to 1.18 cm on the current echo.  She is scheduled for repeat echocardiogram on April 30, later this month.  The patient does not have symptoms concerning for COVID-19 infection (fever, chills, cough, or new shortness of breath).  She has received shots of her coronavirus vaccine.   Past Medical History:  Diagnosis Date  . Anxiety   . Depression   . Hyperlipidemia   . Lymphocytic colitis   . Malaria    as a child  . Memory loss    Past Surgical History:  Procedure Laterality Date  . [place]  . CARDIAC VALVE SURGERY  2016     Current Meds  Medication Sig  . acetaminophen (TYLENOL) 325 MG tablet Take 650 mg by mouth every 6 (six) hours as needed.  Marland Kitchen atorvastatin (LIPITOR) 80 MG tablet Take 1 tablet (80 mg total) by mouth daily.  Marland Kitchen bismuth subsalicylate (PEPTO BISMOL) 262 MG/15ML suspension Take 30 mLs by mouth every 6 (six) hours as needed.  . Budesonide ER (UCERIS) 9 MG TB24 Take 9 mg by mouth daily.  . diphenhydrAMINE (BENADRYL) 25 MG tablet Take 25 mg by mouth every 6 (six) hours as needed.  Marland Kitchen escitalopram (LEXAPRO) 10 MG tablet 10 mg. Take 1/2 tablet by mouth daily  . LORazepam (ATIVAN) 0.5 MG tablet TAKE 1 TABLET BY  MOUTH IN THE MORNING, TAKE 1/2 A TABLET BY MOUTH LUNCH AND DINNER AS NEEDED FOR ANXIETY.  . traZODone (DESYREL) 50 MG tablet TAKE 1/2-1 TABLET BY MOUTH EVERY NIGHT AT BEDTIME AS NEEDED FOR SLEEP  . VITAMIN D PO Take 1 tablet by mouth daily.     Allergies:   Patient has no known allergies.   Social History   Tobacco Use  . Smoking status: Former Smoker    Types: Cigarettes    Quit date: 1980    Years since quitting: 41.3  . Smokeless tobacco: Never Used  Substance Use Topics  . Alcohol use: Yes    Alcohol/week: 14.0 standard drinks    Types: 14 Glasses of wine per week     Comment:  02/06/20 none since fall in Jan   . Drug use: Never     Family Hx: The patient's family history includes Cancer in her mother; Hyperlipidemia in her mother; Hypertension in her mother; Kidney disease in her father; Parkinson's disease in her father; Parkinsonism in her father. There is no history of Colon cancer, Esophageal cancer, Rectal cancer, or Stomach cancer.  ROS:   Please see the history of present illness.     All other systems reviewed and are negative.   Prior CV studies:   The following studies were reviewed today:  06/21/2019 ECHO   1. The left ventricle has normal systolic function with an ejection fraction of 60-65%. The cavity size was normal. There is mildly increased left ventricular wall thickness. Left ventricular diastolic Doppler parameters are consistent with impaired  relaxation.  2. The right ventricle has normal systolic function. The cavity was normal. There is no increase in right ventricular wall thickness.  3. Left atrial size was mild-moderately dilated.  4. Mild calcification of the mitral valve leaflet. No evidence of mitral valve stenosis.  5. There is a bioprosthetic aortic valve, leaflets not well-visualized. No aortic insufficiency. Mean gradient is elevated at 25 mmHg.  6. The aortic root is normal in size and structure.  7. There is mild dilatation of the ascending aorta measuring 40 mm.  8. Normal IVC size. PA systolic pressure 26 mmHg.  Labs/Other Tests and Data Reviewed:    EKG: electrocardiogram was ordered today and shows normal sinus rhythm, QS pattern in leads V1-V2, no repolarization abnormalities, no signs of LVH.  Recent Labs: 12/18/2019: ALT 5; BUN 22; Creat 0.63; Hemoglobin 12.3; Magnesium 2.1; Platelets 174; Potassium 3.9; Sodium 143; TSH 1.68   Recent Lipid Panel Lab Results  Component Value Date/Time   CHOL 217 (H) 12/18/2019 04:30 PM   TRIG 91 12/18/2019 04:30 PM   HDL 96 12/18/2019 04:30 PM   CHOLHDL 2.3  12/18/2019 04:30 PM   LDLCALC 102 (H) 12/18/2019 04:30 PM    Wt Readings from Last 3 Encounters:  04/09/20 120 lb (54.4 kg)  02/06/20 115 lb (52.2 kg)  01/23/20 117 lb 9.6 oz (53.3 kg)     Objective:    Vital Signs:  BP 120/78   Pulse 80   Temp (!) 96.8 F (36 C)   Ht 5\' 4"  (1.626 m)   Wt 120 lb (54.4 kg)   BMI 20.60 kg/m     General: Alert, oriented x3, no distress, lean Head: no evidence of trauma, PERRL, EOMI, no exophtalmos or lid lag, no myxedema, no xanthelasma; normal ears, nose and oropharynx Neck: normal jugular venous pulsations and no hepatojugular reflux; brisk carotid pulses without delay and no carotid bruits Chest: clear to auscultation,  no signs of consolidation by percussion or palpation, normal fremitus, symmetrical and full respiratory excursions Cardiovascular: normal position and quality of the apical impulse, regular rhythm, normal first and second heart sounds, early peaking [id number] systolic ejection murmur in the aortic focus no diastolic murmurs, rubs or gallops Abdomen: no tenderness or distention, no masses by palpation, no abnormal pulsatility or arterial bruits, normal bowel sounds, no hepatosplenomegaly Extremities: no clubbing, cyanosis or edema; 2+ radial, ulnar and brachial pulses bilaterally; 2+ right femoral, posterior tibial and dorsalis pedis pulses; 2+ left femoral, posterior tibial and dorsalis pedis pulses; no subclavian or femoral bruits Neurological: grossly nonfocal Psych: Normal mood and affect     1. H/O prosthetic aortic valve replacement   2. Hypercholesterolemia   3. Orthostatic hypotension     ASSESSMENT & PLAN:    1. AS s/p (redo) AVR: Gradients have been moderately increased, but stable, due for repeat echocardiogram in a couple of weeks.  I do not believe her dizziness is consistent with aortic stenosis since it is very brief and not associated with physical exertion.  Asked the patient to promptly report exertional angina,  dyspnea, syncope.  Has a prescription for amoxicillin before dental procedures and understands how to take SBE prophylaxis. 2. Hyperlipidemia: On statin with LDL cholesterol very close to target at 102 an exceptionally good HDL at 96. 3. Weight loss: Substantial weight loss after her husband passed away, but this has stabilized. 4. Orthostatic hypotension: I think this remains the most likely cause for her occasional episodes of lightheadedness.  Discussed the potential causes.  She is not on antihypertensive medications or diuretics, but she does take some natural diuretics such as coffee and wine.  Encouraged her to also drink plenty of fluids and to try to reduce the intake of alcohol to 1 drink a day.  Okay to have a liberal intake of salt in her diet.  Avoid sudden changes in position.  Thankfully she has not had any falls.  At this point treatment with midodrine or fludrocortisone does not appear to be indicated.  COVID-19 Education: The signs and symptoms of COVID-19 were discussed with the patient and how to seek care for testing (follow up with PCP or arrange E-visit).  The importance of social distancing was discussed today.  Time:   Today, I have spent 24 minutes with the patient with telehealth technology discussing the above problems.     Medication Adjustments/Labs and Tests Ordered: Current medicines are reviewed at length with the patient today.  Concerns regarding medicines are outlined above.   Tests Ordered: Orders Placed This Encounter  Procedures  . EKG 12-Lead    Medication Changes: No orders of the defined types were placed in this encounter.   Follow Up:  Virtual Visit or In Person 12 months  Signed, Sanda Klein, MD  04/09/2020 6:48 PM    [place]

## 2020-04-17 ENCOUNTER — Ambulatory Visit: Payer: Medicare Other | Admitting: Physician Assistant

## 2020-04-17 IMAGING — CT CT L SPINE W/O CM
3 series · 12 of 33 positions shown, 14 images · non-contrast
Comparison: CT scan of the abdomen and pelvis dated 05/14/2019

CLINICAL DATA: Low back pain since the patient fell on 01/17/2020

EXAM:
CT LUMBAR SPINE WITHOUT CONTRAST
TECHNIQUE: Multidetector CT imaging of the lumbar spine was performed without
intravenous contrast administration. Multiplanar CT image
reconstructions were also generated.

[Series 4: l-spine 2.00 br40 s3 lspine st · axial · 0.30mm/px · z∈[-1126,-972]mm · 4 of 113 slices shown, 5 images]
[im 18/113  soft-tissue]
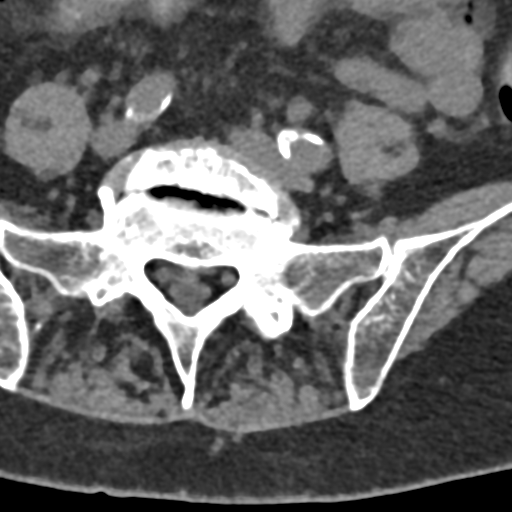
[im 18/113  bone]
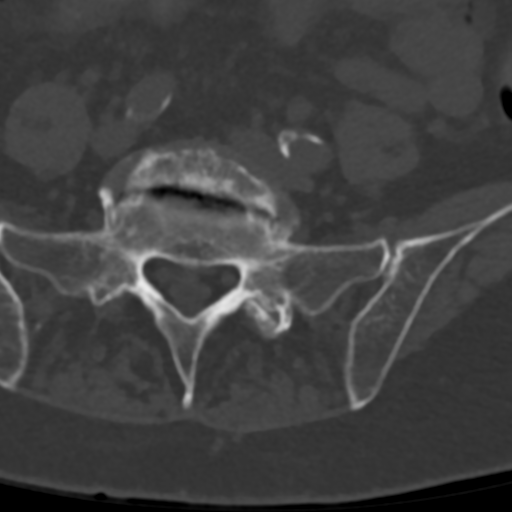
[im 44/113  bone]
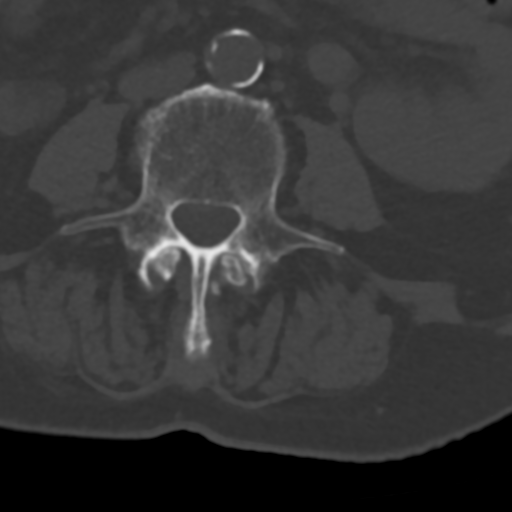
[im 69/113  bone]
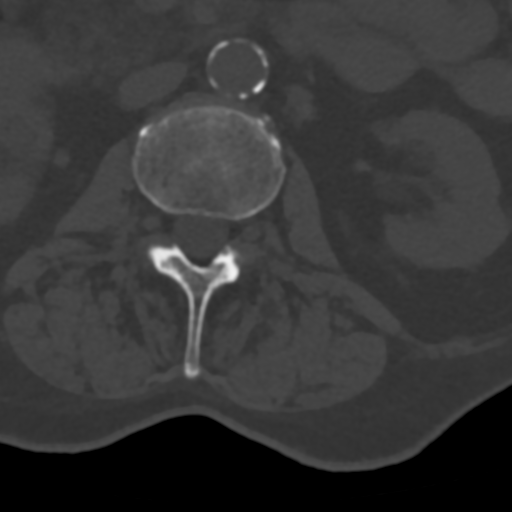
[im 95/113  bone]
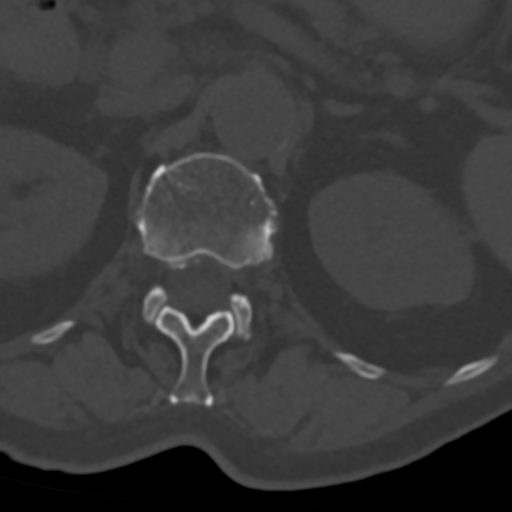

[Series 6: l-spine 2.00 br60 s3 sag bone · sagittal · 0.30mm/px · 5 of 70 slices shown, 6 images]
[im 24/70  bone]
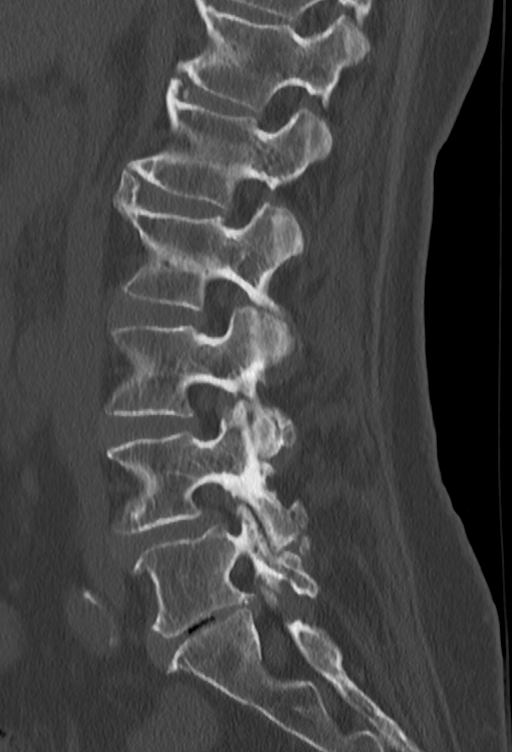
[im 29/70  bone]
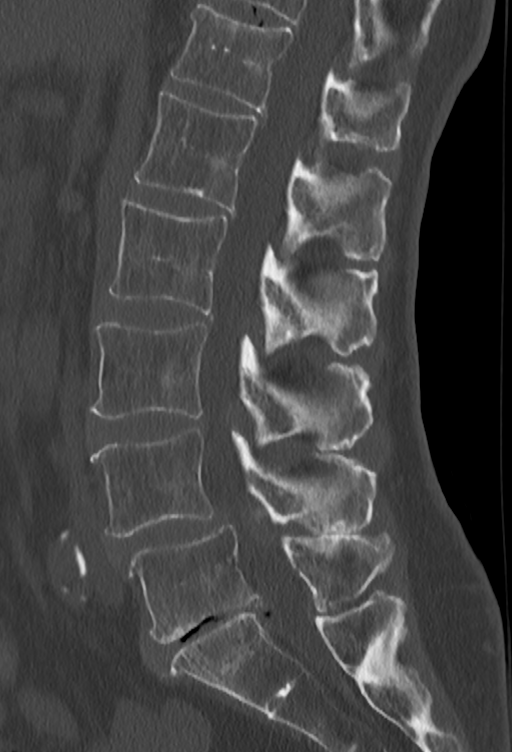
[im 35/70  soft-tissue]
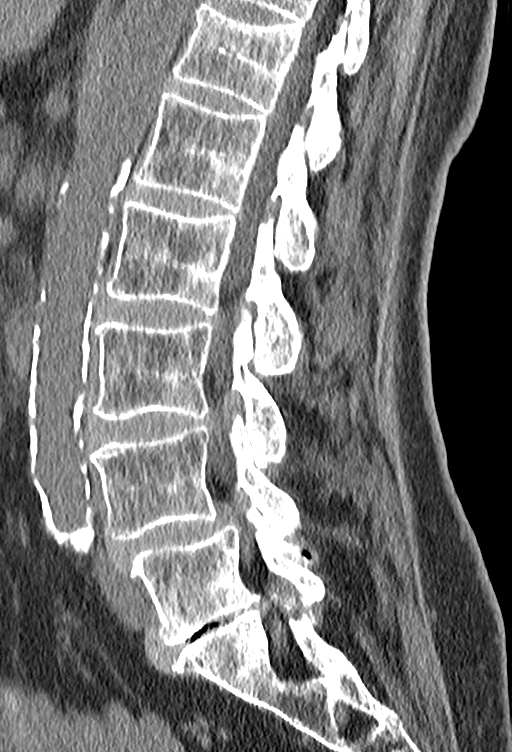
[im 35/70  bone]
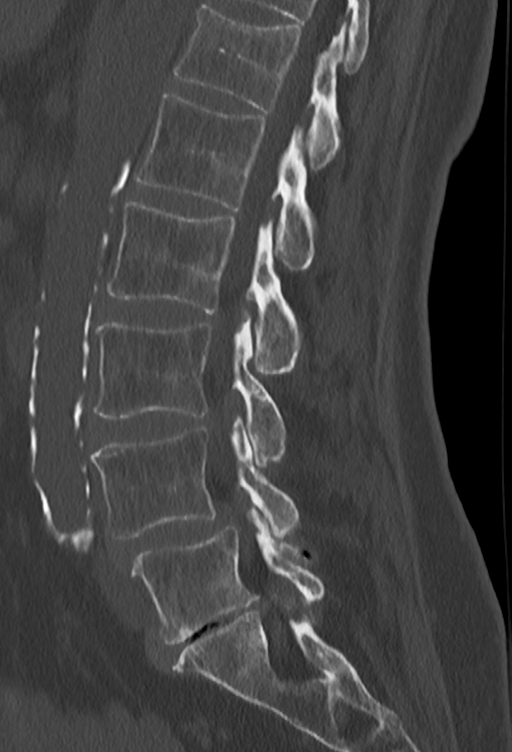
[im 41/70  bone]
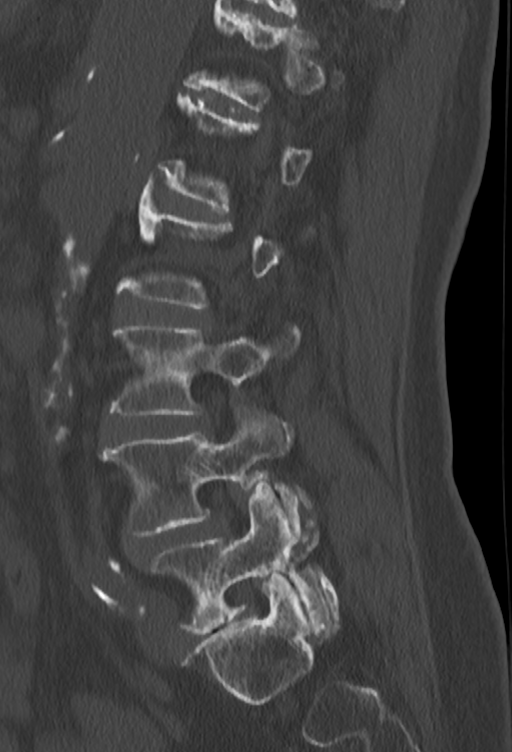
[im 47/70  bone]
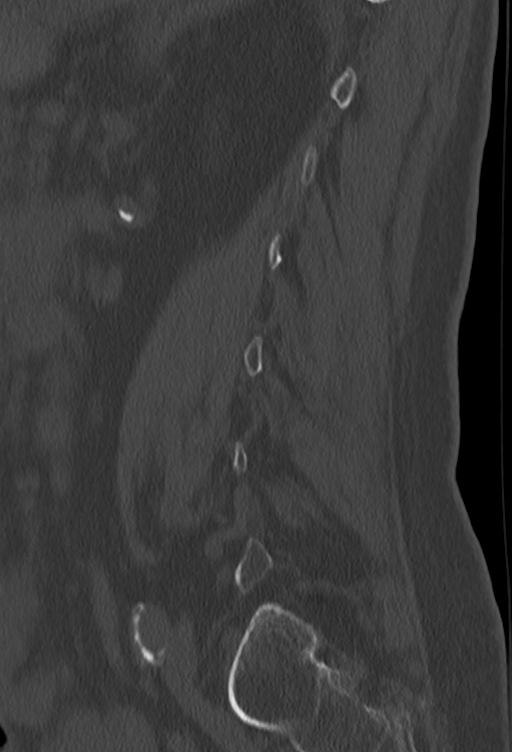

[Series 8: l-spine 2.00 br60 s3 cor bone · coronal · 0.28mm/px · 3 of 77 slices shown]
[im 16/77  bone]
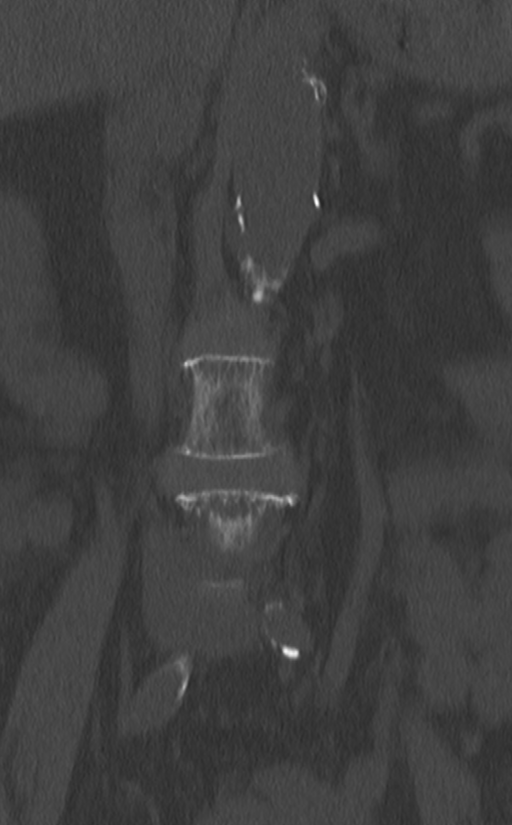
[im 31/77  bone]
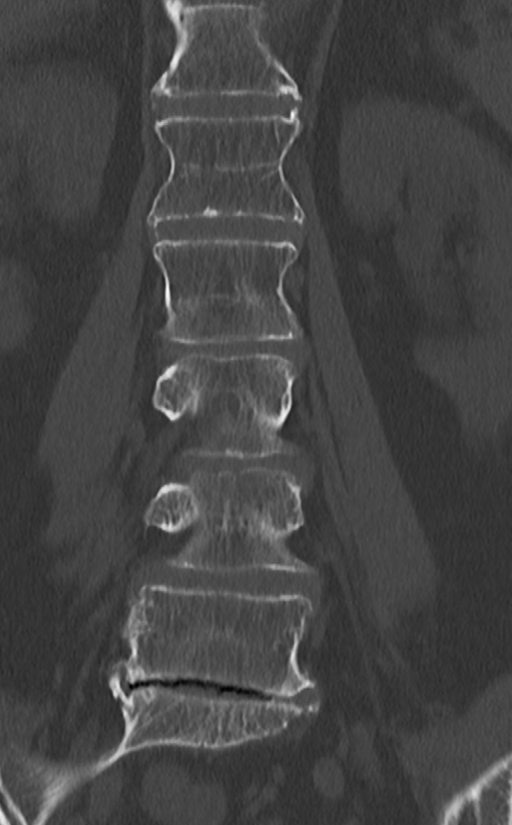
[im 46/77  bone]
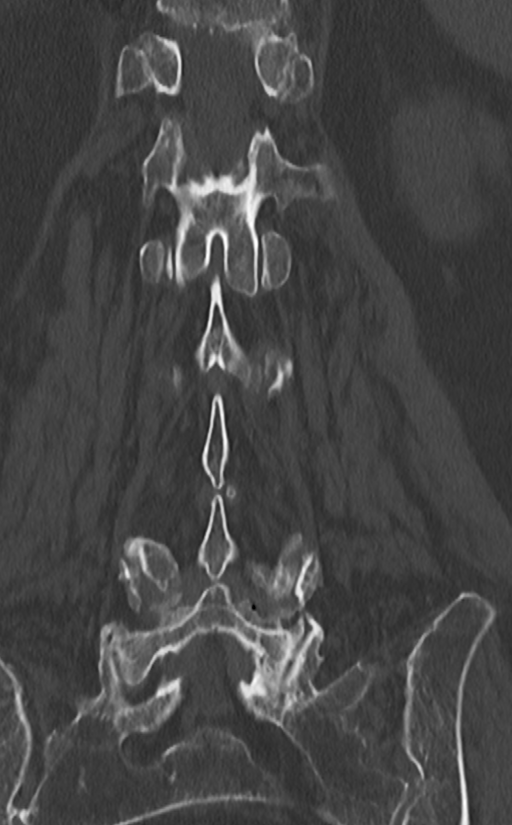

[12 of 33 positions shown; findings below may reference images not displayed]

FINDINGS: Segmentation: 5 lumbar type vertebrae.

Alignment: 4 mm spondylolisthesis at L4-5. 3 mm retrolisthesis at
L5-S1. These are unchanged since the prior study of 05/14/2019.

Vertebrae: There is a small focal irregularity of the anterior
cortex of the superior endplate of T12 which appears new since
05/14/2019. This is only seen on the lateral reconstruction images
no other acute bone abnormality.

Paraspinal and other soft tissues: Aortic atherosclerosis. Tiny
stone in the upper pole of the left kidney, unchanged.

Disc levels: T11-12: New disc degeneration with a vacuum phenomenon.
No disc bulging or protrusion. Probable very tiny compression
fracture of the anterior superior aspect of the T12 vertebral body.

T12-L1: Tiny disc bulge with accompanying osteophytes asymmetric to
the right with no neural impingement.

L1-2: No significant abnormality.

L2-3: No significant abnormality.

L3-4: Tiny broad-based disc bulge. Hypertrophy of the ligamentum
flavum and facet joints combine to create mild to moderate spinal
stenosis, best seen on image 64 of series 4, unchanged since the
prior study.

L4-5: Severe bilateral facet arthritis with grade 1
spondylolisthesis. Small broad-based bulge of the uncovered disc.
Hypertrophy of the facet joints and ligamentum flavum combine to
create moderately severe spinal stenosis and compression of the
right lateral recess by the hypertrophied ligamentum flavum. The
appearance is similar to the prior study of 05/14/2019.

L5-S1: Chronic disc space narrowing with a vacuum phenomenon. Slight
retrolisthesis, chronic. Small broad-based disc protrusion with
extruded gas in the spinal canal behind the superior endplate of S1,
unchanged. No focal neural impingement. Right foraminal stenosis.
However, the right L5 nerve appears to exit without impingement.
This is also unchanged. Moderate bilateral facet arthritis, left
greater than right.
IMPRESSION: 1. Findings consistent with a very tiny compression fracture of the
anterior superior aspect of the T12 vertebral body, new since
05/14/2019.
[DATE]. Moderately severe spinal stenosis at L4-5, unchanged.
3. Mild to moderate spinal stenosis at L3-4, unchanged.
4. Chronic right foraminal stenosis at L5-S1.

## 2020-04-17 IMAGING — CT CT HEAD W/O CM
4 series · 16 of 47 positions shown, 18 images · non-contrast
Comparison: 03/08/2019

CLINICAL DATA: Headache, dizziness, fall

EXAM:
CT HEAD WITHOUT CONTRAST
TECHNIQUE: Contiguous axial images were obtained from the base of the skull
through the vertex without intravenous contrast.

[Series 2: head 5.00 hr40 s3 axial ibhc · axial · 0.41mm/px · z∈[-604,-484]mm · 7 of 32 slices shown, 9 images]
[im 4/32  brain]
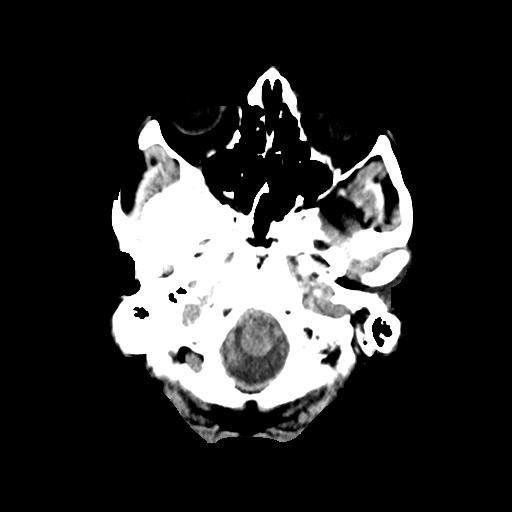
[im 4/32  bone]
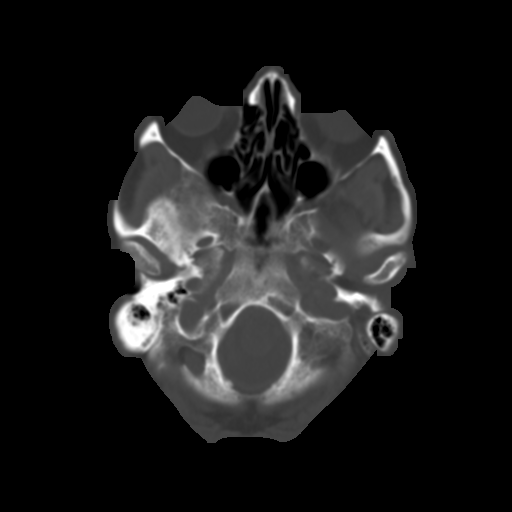
[im 8/32  brain]
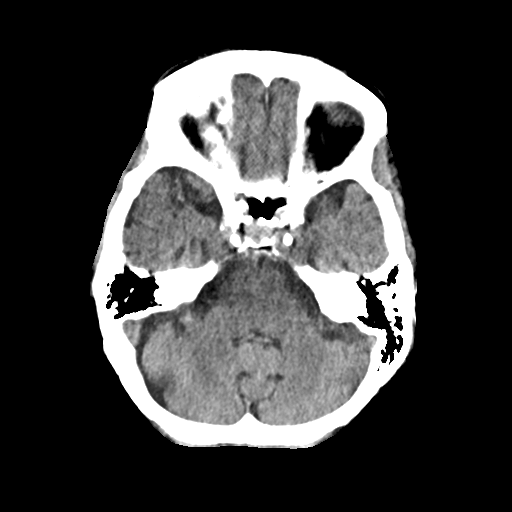
[im 12/32  brain]
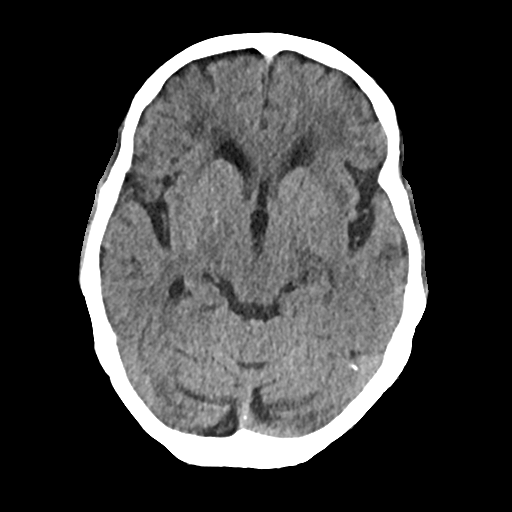
[im 16/32  brain]
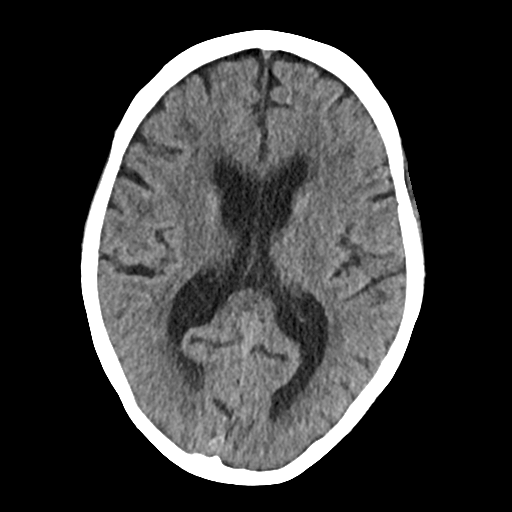
[im 20/32  brain]
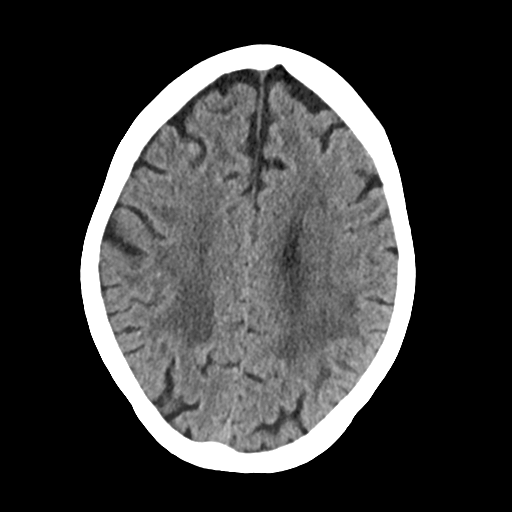
[im 20/32  bone]
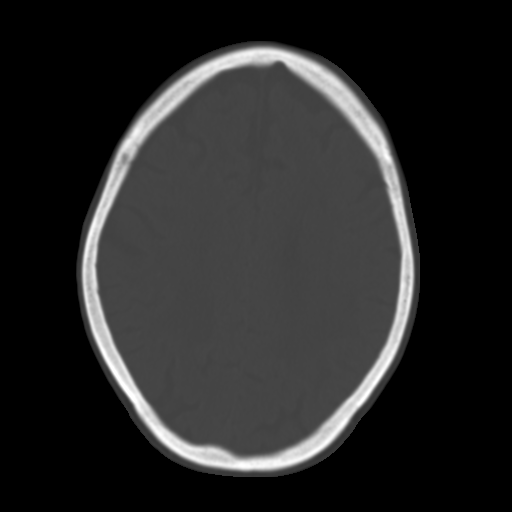
[im 24/32  brain]
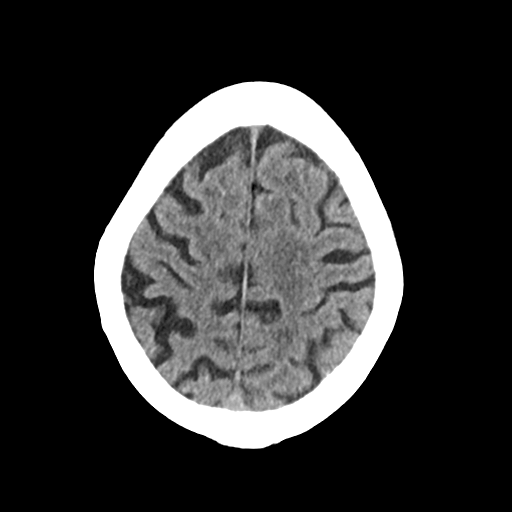
[im 28/32  brain]
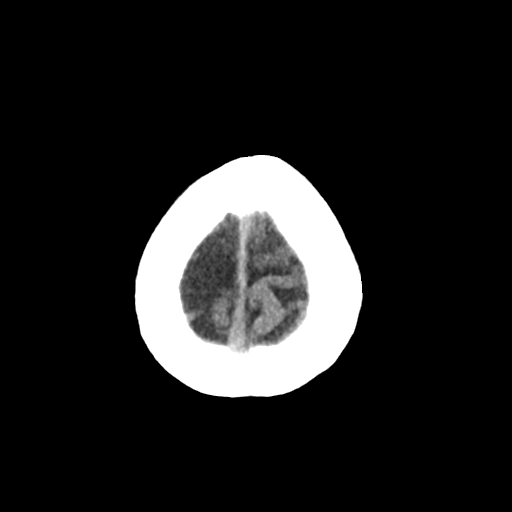

[Series 3: head 2.00 hr60 s3 axial bone · axial · 0.41mm/px · z∈[-606,-574]mm · 3 of 80 slices shown]
[im 8/80  bone]
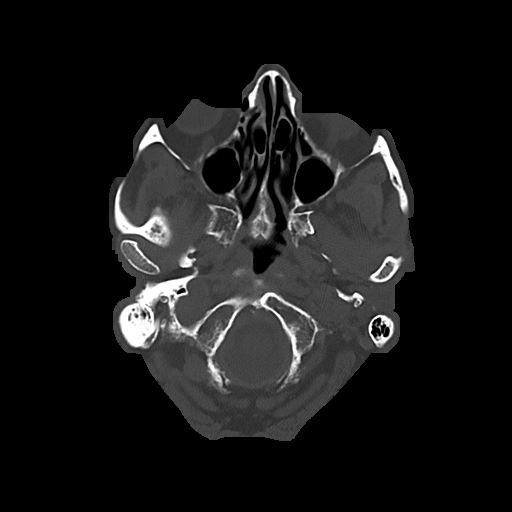
[im 16/80  bone]
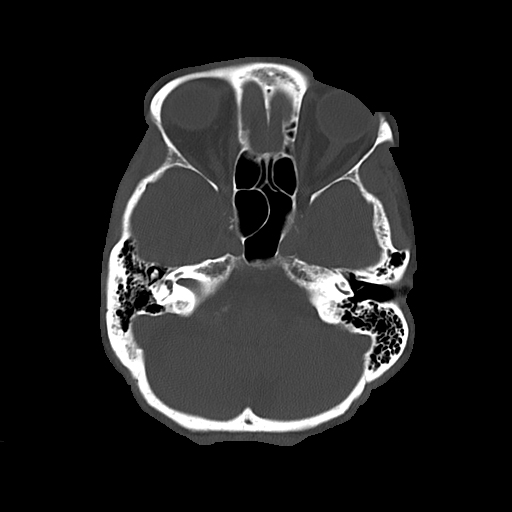
[im 24/80  bone]
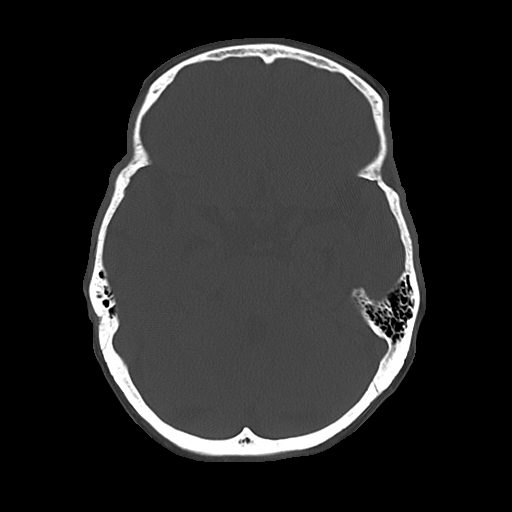

[Series 4: head 3.00 hr40 s3 sag · sagittal · 0.32mm/px · 3 of 84 slices shown]
[im 28/84  brain]
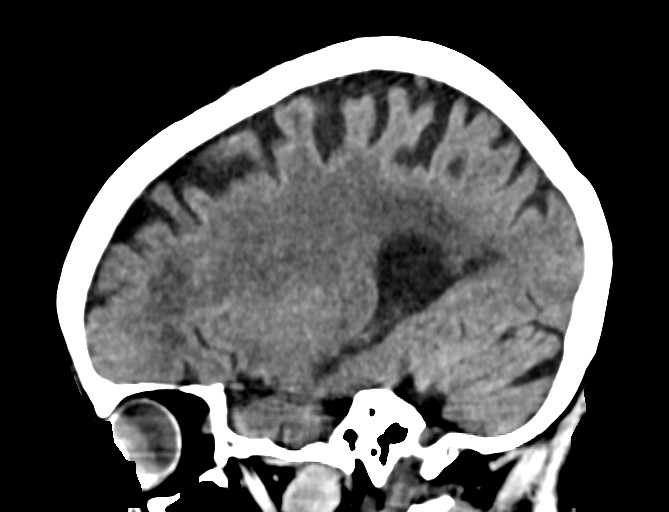
[im 42/84  brain]
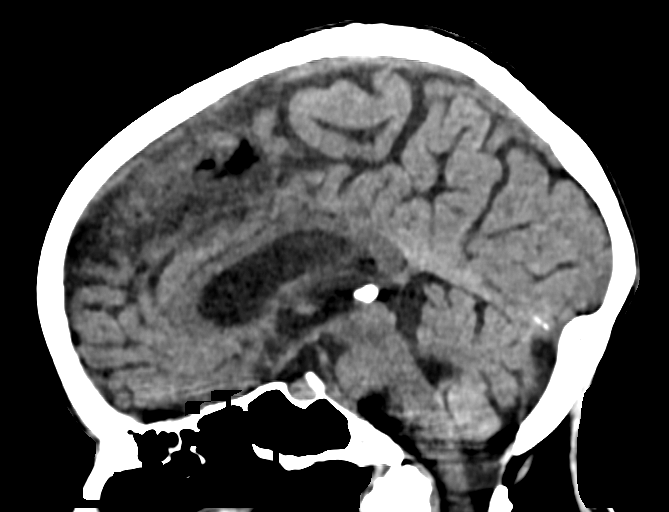
[im 56/84  brain]
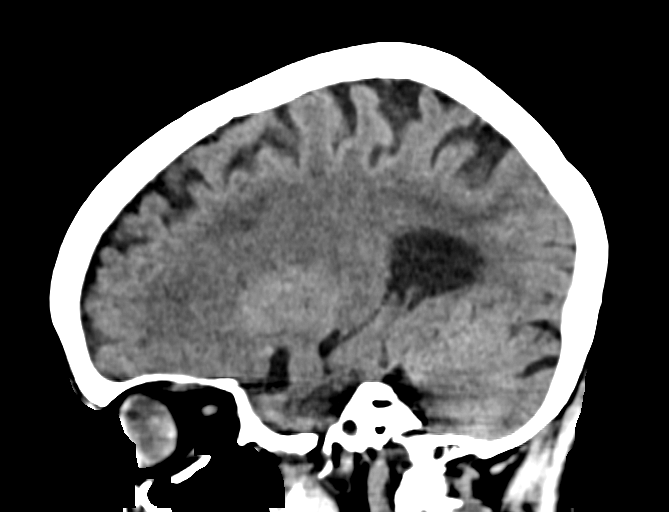

[Series 6: head 3.00 hr40 s3 cor · coronal · 0.32mm/px · 3 of 105 slices shown]
[im 35/105  brain]
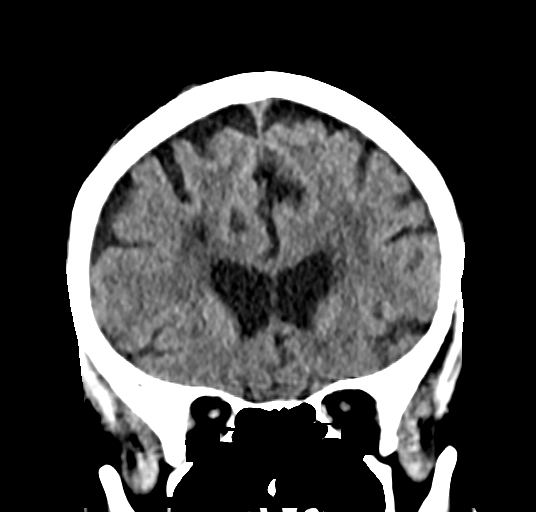
[im 47/105  brain]
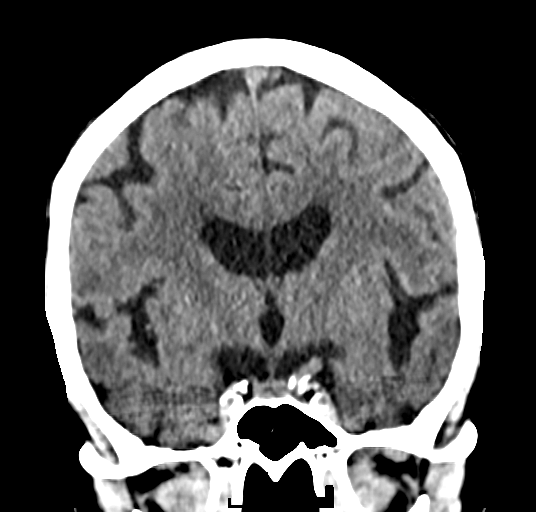
[im 58/105  brain]
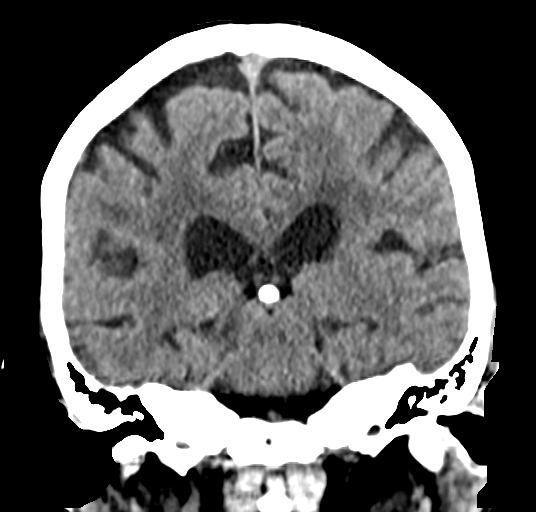

[16 of 47 positions shown; findings below may reference images not displayed]

FINDINGS: Brain: No evidence of acute infarction, hemorrhage, hydrocephalus,
extra-axial collection or mass lesion/mass effect. Extensive
low-density changes within the periventricular and subcortical white
matter compatible with chronic microvascular ischemic change.
Moderate diffuse cerebral volume loss.

Vascular: Mild atherosclerotic calcifications involving the large
vessels of the skull base. No unexpected hyperdense vessel.

Skull: Normal. Negative for fracture or focal lesion.

Sinuses/Orbits: No acute finding.

Other: None.
IMPRESSION: 1.  No acute intracranial findings.

2.  Chronic microvascular ischemic change and cerebral volume loss.

## 2020-04-19 ENCOUNTER — Other Ambulatory Visit: Payer: Self-pay | Admitting: Physician Assistant

## 2020-04-20 ENCOUNTER — Other Ambulatory Visit: Payer: Self-pay | Admitting: Physician Assistant

## 2020-04-21 DIAGNOSIS — K52832 Lymphocytic colitis: Secondary | ICD-10-CM | POA: Insufficient documentation

## 2020-04-21 NOTE — Progress Notes (Signed)
MEDICARE WELLNESS  Assessment and Plan: Encounter for Medicare annual wellness exam 1 year  Depression, major, recurrent, in partial remission ([place]) -     TSH -     Stop the lexapro, will give samples of trintellix for memory, if this does not help can try effexor Check out CBT therapy  Alcohol abuse with alcohol-induced disorder (H[facility]) -     TSH - cut back, check out CBT therapy  Senile purpura ([place]) Discussed process, protect skin, sunscreen  B12 deficiency -     Vitamin B12 -     Vitamin B1 Stop drinking  Mixed hyperlipidemia -     Lipid panel check lipids decrease fatty foods increase activity.   H/O prosthetic aortic valve replacement Continue follow up cardio  Medication management -     CBC with Differential/Platelet -     COMPLETE METABOLIC PANEL WITH GFR -     Magnesium  Vitamin D deficiency -     VITAMIN D 25 Hydroxy (Vit-D Deficiency, Fractures)  Anemia, unspecified type -     CBC with Differential/Platelet  Lung nodules 05/14/2019, had AB CT that showed 1.4 cm ground glass right lung and 0.6cm left lower lobe. Need to repeat this year. Will set up  Lymphocytic colitis Stop drinking, continue to follow up GI  Memory loss 30/30 on MMSE, stop drinking, check vitamin levels, try to get on better depression medication The patient was advised to call immediately if she has any concerning symptoms in the interval. The patient voices understanding of current treatment options and is in agreement with the current care plan.The patient knows to call the clinic with any problems, questions or concerns or go to the ER if any further progression of symptoms.   Discussed med's effects and SE's. Screening labs and tests as requested with regular follow-up as recommended. Over 40 minutes of exam, counseling, chart review, and complex, high level critical decision making was performed this visit.    Plan:   During the course of the visit the patient was educated  and counseled about appropriate screening and preventive services including:    Pneumococcal vaccine   Prevnar 13  Influenza vaccine  Td vaccine  Screening electrocardiogram  Bone densitometry screening  Colorectal cancer screening  Diabetes screening  Glaucoma screening  Nutrition counseling   Advanced directives: requested   HPI  79 y.o. female  presents for medicare wellness and follow up for has Alcohol abuse with alcohol-induced disorder ([place]); Medication management; Mixed hyperlipidemia; H/O prosthetic aortic valve replacement; B12 deficiency; Anemia; Vitamin D deficiency; Depression, major, recurrent, in partial remission ([place]); Enrolled in chronic care management; Lung nodules; Lymphocytic colitis; and Senile purpura ([place]) on their problem list..  Her blood pressure has been controlled at home, today their BP is BP: 140/80 She does not workout. She denies chest pain, shortness of breath, dizziness.   She has history of anxiety, her husband had kidney cancer, passed 2020. Moved in with her daughter from [place].  She has been on lexapro, zoloft, celexa without help. She is taking lorazepam but has started to increase her wine again and take trazodone at night.  She is following with Dr. Kathlen Mody, last visit Feb, normal MRI  Brain 04/2019. Last MMSE 26 Her daughter states memory is worse, she also had a low B1 of 7 02/2019 and is on Bcomplex. She had normal CT head last visit due to a fall while drinking. We discussed with the changes and stress can be related to this.  May need sleep study when we are able to evaluate better.  Lab Results  Component Value Date   [id number] 04/22/2020    She has a history of hyperlipidemia on liptior and has history of bovine aortic valve replacement x 2, last echo was 05/2019, following with Dr. Recardo Evangelist Her blood pressure has been controlled at home, today their BP is BP: 140/80  She does not workout. She denies chest pain,  shortness of breath, dizziness.  She is on cholesterol medication and denies myalgias. Her cholesterol is not at goal. The cholesterol last visit was:   Lab Results  Component Value Date   CHOL 216 (H) 04/22/2020   HDL 91 04/22/2020   LDLCALC 108 (H) 04/22/2020   TRIG 79 04/22/2020   CHOLHDL 2.4 04/22/2020   Last A1C in the office was:  Lab Results  Component Value Date   HGBA1C 5.6 10/22/2019   Patient is on Vitamin D supplement.   Lab Results  Component Value Date   VD25OH 47 04/22/2020       She was found to have lymphocytic colitis and is following with GI, on budesonide.   Lab Results  Component Value Date   GFRNONAA 87 04/22/2020     Current Medications:   Current Outpatient Medications (Endocrine & Metabolic):  Marland Kitchen  Budesonide ER (UCERIS) 9 MG TB24, Take 9 mg by mouth daily.  Current Outpatient Medications (Cardiovascular):  .  atorvastatin (LIPITOR) 80 MG tablet, TAKE 1 TABLET(80 MG) BY MOUTH DAILY  Current Outpatient Medications (Respiratory):  .  diphenhydrAMINE (BENADRYL) 25 MG tablet, Take 25 mg by mouth every 6 (six) hours as needed.  Current Outpatient Medications (Analgesics):  .  acetaminophen (TYLENOL) 325 MG tablet, Take 650 mg by mouth every 6 (six) hours as needed.   Current Outpatient Medications (Other):  .  amoxicillin (AMOXIL) 500 MG capsule, Take 2 grams (4 pills) before each of the three procedures. .  bismuth subsalicylate (PEPTO BISMOL) 262 MG/15ML suspension, Take 30 mLs by mouth every 6 (six) hours as needed. Marland Kitchen  LORazepam (ATIVAN) 0.5 MG tablet, TAKE 1 TABLET BY MOUTH IN THE MORNING, TAKE 1/2 A TABLET BY MOUTH LUNCH AND DINNER AS NEEDED FOR ANXIETY. .  traZODone (DESYREL) 50 MG tablet, TAKE 1/2-1 TABLET BY MOUTH EVERY NIGHT AT BEDTIME AS NEEDED FOR SLEEP .  VITAMIN D PO, Take 1 tablet by mouth daily.  Allergies:  No Known Allergies Medical History:  She has Alcohol abuse with alcohol-induced disorder ([place]); Medication management; Mixed  hyperlipidemia; H/O prosthetic aortic valve replacement; B12 deficiency; Anemia; Vitamin D deficiency; Depression, major, recurrent, in partial remission ([place]); Enrolled in chronic care management; Lung nodules; Lymphocytic colitis; and Senile purpura ([place]) on their problem list.   Patient Care Team: Unk Pinto, MD as PCP - General (Internal Medicine) Croitoru, Dani Gobble, MD as PCP - Cardiology (Cardiology) [name], MD as Consulting Physician (Neurology)  Surgical History:  She has a past surgical history that includes Cardiac valuve replacement (1999) and Cardiac valuve replacement (2016). Family History:  Herfamily history includes Cancer in her mother; Hyperlipidemia in her mother; Hypertension in her mother; Kidney disease in her father; Parkinson's disease in her father; Parkinsonism in her father. Social History:  She reports that she quit smoking about 41 years ago. Her smoking use included cigarettes. She has never used smokeless tobacco. She reports current alcohol use of about 14.0 standard drinks of alcohol per week. She reports that she does not use drugs.  Immunization History  Administered Date(s) Administered  . PFIZER SARS-COV-2 Vaccination 02/19/2020, 03/11/2020  . Pneumococcal Conjugate-13 12/18/2019   Colonoscopy 06/2019 Dr. Tarri Glenn.  EGD 06/2019 Echo 05/2019 CXR 02/2019 [facility] dUE MRI brain 2020  MEDICARE WELLNESS OBJECTIVES: Physical activity: Current Exercise Habits: The patient does not participate in regular exercise at present Cardiac risk factors: Cardiac Risk Factors include: advanced age (>48men, >60 women);dyslipidemia;hypertension;sedentary lifestyle Depression/mood screen:   Depression screen [facility] 2/9 04/22/2020  Decreased Interest 0  Down, Depressed, Hopeless 0  PHQ - 2 Score 0  Altered sleeping -  Tired, decreased energy -  Change in appetite -  Feeling bad or failure about yourself  -  Trouble concentrating -  Moving slowly or  fidgety/restless -  Suicidal thoughts -  PHQ-9 Score -  Difficult doing work/chores -    ADLs:  In your present state of health, do you have any difficulty performing the following activities: 04/24/2020  Hearing? N  Vision? N  Difficulty concentrating or making decisions? Y  Walking or climbing stairs? N  Dressing or bathing? N  Doing errands, shopping? Y  Some recent data might be hidden     Cognitive Testing  Alert? Yes  Normal Appearance?Yes  Oriented to person? Yes  Place? Yes   Time? Yes  Recall of three objects?  3/3  Can perform simple calculations? Yes  Displays appropriate judgment?Yes  Can read the correct time from a watch face?Yes  MMSE - Mini Mental State Exam 04/22/2020 02/06/2020  Orientation to time 5 3  Orientation to Place 5 4  Registration 3 3  Attention/ Calculation 5 4  Recall 3 3  Language- name 2 objects 2 2  Language- repeat 1 1  Language- follow 3 step command 3 3  Language- read & follow direction 1 1  Write a sentence 1 1  Copy design 1 1  Total score 30 26    EOL planning: Does Patient Have a Medical Advance Directive?: Yes Type of Advance Directive: Healthcare Power of Attorney, Living will Copy of [place] in Chart?: No - copy requested    Review of Systems: Review of Systems  Constitutional: Negative.   HENT: Negative.   Eyes: Negative.   Respiratory: Negative.   Cardiovascular: Negative.   Gastrointestinal: Negative.   Genitourinary: Negative.   Musculoskeletal: Negative.   Skin: Negative.   Neurological: Negative.   Endo/Heme/Allergies: Negative.   Psychiatric/Behavioral: Negative.      Physical Exam: Estimated body mass index is 20.43 kg/m as calculated from the following:   Height as of this encounter: 5\' 4"  (1.626 m).   Weight as of this encounter: 119 lb (54 kg). BP 140/80   Pulse 62   Temp (!) 97.5 F (36.4 C)   Ht 5\' 4"  (1.626 m)   Wt 119 lb (54 kg)   SpO2 96%   BMI 20.43 kg/m   General Appearance: elderly frail appearing female in no apparent distress Eyes: PERRLA, EOMs, conjunctiva no swelling or erythema Sinuses: No Frontal/maxillary tenderness ENT/Mouth: Ext aud canals clear, normal light reflex with TMs without erythema, bulging.  No erythema, swelling, or exudate on post pharynx. Tonsils not swollen or erythematous. Hearing good Neck: Supple, thyroid normal. No bruits  Respiratory: Respiratory effort normal, BS equal bilaterally without rales, rhonchi, wheezing or stridor.  Cardio: RRR with 2/6 systolic murmur without rubs or gallops.  Chest: symmetric, with normal excursions and percussion.  Abdomen: Soft, nontender, no guarding, rebound, hernias, masses, or organomegaly.  Lymphatics: Non tender without lymphadenopathy.  Musculoskeletal: Full ROM all peripheral extremities, good grip strength, antalgic, unsteady gait.  Skin: Warm, dry without rashes, lesions, ecchymosis. Neuro: Cranial nerves intact,,no cog wheeling, Normal muscle tone, wide based stance,   Psych: Awake and oriented X 3, normal affect, Insight and Judgment appropriate  Medicare Attestation I have personally reviewed: The patient's medical and social history Their use of alcohol, tobacco or illicit drugs Their current medications and supplements The patient's functional ability including ADLs,fall risks, home safety risks, cognitive, and hearing and visual impairment Diet and physical activities Evidence for depression or mood disorders  The patient's weight, height, BMI, and visual acuity have been recorded in the chart.  I have made referrals, counseling, and provided education to the patient based on review of the above and I have provided the patient with a written personalized care plan for preventive services.     Vicie Mutters 1:07 PM [facility]

## 2020-04-22 ENCOUNTER — Ambulatory Visit (INDEPENDENT_AMBULATORY_CARE_PROVIDER_SITE_OTHER): Payer: Medicare Other | Admitting: Physician Assistant

## 2020-04-22 ENCOUNTER — Other Ambulatory Visit: Payer: Self-pay

## 2020-04-22 ENCOUNTER — Encounter: Payer: Self-pay | Admitting: Physician Assistant

## 2020-04-22 VITALS — BP 140/80 | HR 62 | Temp 97.5°F | Ht 64.0 in | Wt 119.0 lb

## 2020-04-22 DIAGNOSIS — E538 Deficiency of other specified B group vitamins: Secondary | ICD-10-CM | POA: Diagnosis not present

## 2020-04-22 DIAGNOSIS — F1019 Alcohol abuse with unspecified alcohol-induced disorder: Secondary | ICD-10-CM

## 2020-04-22 DIAGNOSIS — K52832 Lymphocytic colitis: Secondary | ICD-10-CM

## 2020-04-22 DIAGNOSIS — D692 Other nonthrombocytopenic purpura: Secondary | ICD-10-CM | POA: Diagnosis not present

## 2020-04-22 DIAGNOSIS — F3341 Major depressive disorder, recurrent, in partial remission: Secondary | ICD-10-CM

## 2020-04-22 DIAGNOSIS — Z0001 Encounter for general adult medical examination with abnormal findings: Secondary | ICD-10-CM | POA: Diagnosis not present

## 2020-04-22 DIAGNOSIS — R918 Other nonspecific abnormal finding of lung field: Secondary | ICD-10-CM

## 2020-04-22 DIAGNOSIS — D649 Anemia, unspecified: Secondary | ICD-10-CM

## 2020-04-22 DIAGNOSIS — Z79899 Other long term (current) drug therapy: Secondary | ICD-10-CM

## 2020-04-22 DIAGNOSIS — E782 Mixed hyperlipidemia: Secondary | ICD-10-CM

## 2020-04-22 DIAGNOSIS — Z0000 Encounter for general adult medical examination without abnormal findings: Secondary | ICD-10-CM

## 2020-04-22 DIAGNOSIS — R6889 Other general symptoms and signs: Secondary | ICD-10-CM

## 2020-04-22 DIAGNOSIS — Z952 Presence of prosthetic heart valve: Secondary | ICD-10-CM

## 2020-04-22 DIAGNOSIS — R413 Other amnesia: Secondary | ICD-10-CM

## 2020-04-22 DIAGNOSIS — E559 Vitamin D deficiency, unspecified: Secondary | ICD-10-CM

## 2020-04-22 MED ORDER — VENLAFAXINE HCL ER 37.5 MG PO CP24: 37.5000 mg | ORAL_CAPSULE | Freq: Every day | ORAL | 2 refills | Status: DC

## 2020-04-22 NOTE — Patient Instructions (Addendum)
Stop the lexapro, do not start the effexor  Start trintellix 5 mg, take with food to try to avoid nausea for at least 2 weeks and then go up to 10 mg  [place]  7 a.m.-6:30 p.m., Monday 7 a.m.-5 p.m., Tuesday-Friday Schedule an appointment by calling [phone].    Counseling services  I suggest calling your insurance and finding out who is in your network and THEN calling those people or looking them up on google.   I'm a big fan of Cognitive Behavioral Therapy, look this up on You tube or check with the therapist you see if they are certified.  This form of therapy helps to teach you skills to better handle with current situation that are causing anxiety or depression.   There are some great apps too Check out [place], give thanks app.  Meditations apps are great like headspace.   Please be aware that some of the medications that you are on can sometimes cause a rare and potentially dangerous adverse reaction, called SEROTONIN SYNDROME: Symptoms of this condition include (but are not limited to):  Agitation or restlessness, confusion, rapid heart rate and high blood pressure, dilated pupils, loss of muscle coordination or twitching muscles, muscle rigidity/stiffness, sweating and/or flushing, diarrhea, headache, shivering, goose bumps. If you have any of these symptoms you may have to stop the medication. Call your health care provider immediately.  Severe serotonin syndrome can be life-threatening emergency. Signs and symptoms of a severe reaction may include: high fever, seizures, irregular heartbeat, unconsciousness or altered level of awareness or personality changes.  If you have any of these new symptoms, call 911 or have someone take you to the emergency room.     Make sure you are on a B complex for low vitamin D

## 2020-04-25 ENCOUNTER — Other Ambulatory Visit (HOSPITAL_COMMUNITY): Payer: Medicare Other

## 2020-04-25 LAB — CBC WITH DIFFERENTIAL/PLATELET
Absolute Monocytes: 530 cells/uL (ref 200–950)
Basophils Absolute: 48 cells/uL (ref 0–200)
Basophils Relative: 0.7 %
Eosinophils Absolute: 82 cells/uL (ref 15–500)
Eosinophils Relative: 1.2 %
HCT: 40 % (ref 35.0–45.0)
Hemoglobin: 12.6 g/dL (ref 11.7–15.5)
Lymphs Abs: 1278 cells/uL (ref 850–3900)
MCH: 29.1 pg (ref 27.0–33.0)
MCHC: 31.5 g/dL — ABNORMAL LOW (ref 32.0–36.0)
MCV: 92.4 fL (ref 80.0–100.0)
MPV: 12.1 fL (ref 7.5–12.5)
Monocytes Relative: 7.8 %
Neutro Abs: 4862 cells/uL (ref 1500–7800)
Neutrophils Relative %: 71.5 %
Platelets: 167 10*3/uL (ref 140–400)
RBC: 4.33 10*6/uL (ref 3.80–5.10)
RDW: 12.8 % (ref 11.0–15.0)
Total Lymphocyte: 18.8 %
WBC: 6.8 10*3/uL (ref 3.8–10.8)

## 2020-04-25 LAB — LIPID PANEL
Cholesterol: 216 mg/dL — ABNORMAL HIGH (ref <200)
HDL: 91 mg/dL (ref >=50)
LDL Cholesterol (Calc): 108 mg/dL (calc) — ABNORMAL HIGH
Non-HDL Cholesterol (Calc): 125 mg/dL (calc) (ref <130)
Total CHOL/HDL Ratio: 2.4 (calc) (ref <5.0)
Triglycerides: 79 mg/dL (ref <150)

## 2020-04-25 LAB — VITAMIN B1: Vitamin B1 (Thiamine): 7 nmol/L — ABNORMAL LOW (ref 8–30)

## 2020-04-25 LAB — COMPLETE METABOLIC PANEL WITH GFR
AG Ratio: 1.5 (calc) (ref 1.0–2.5)
ALT: 17 U/L (ref 6–29)
AST: 26 U/L (ref 10–35)
Albumin: 4.1 g/dL (ref 3.6–5.1)
Alkaline phosphatase (APISO): 62 U/L (ref 37–153)
BUN/Creatinine Ratio: 24 (calc) — ABNORMAL HIGH (ref 6–22)
BUN: 14 mg/dL (ref 7–25)
CO2: 28 mmol/L (ref 20–32)
Calcium: 9.5 mg/dL (ref 8.6–10.4)
Chloride: 105 mmol/L (ref 98–110)
Creat: 0.59 mg/dL — ABNORMAL LOW (ref 0.60–0.93)
GFR, Est African American: 101 mL/min/{1.73_m2} (ref >=60)
GFR, Est Non African American: 87 mL/min/{1.73_m2} (ref >=60)
Globulin: 2.7 g/dL (calc) (ref 1.9–3.7)
Glucose, Bld: 95 mg/dL (ref 65–99)
Potassium: 4.2 mmol/L (ref 3.5–5.3)
Sodium: 142 mmol/L (ref 135–146)
Total Bilirubin: 1.1 mg/dL (ref 0.2–1.2)
Total Protein: 6.8 g/dL (ref 6.1–8.1)

## 2020-04-25 LAB — MAGNESIUM: Magnesium: 2 mg/dL (ref 1.5–2.5)

## 2020-04-25 LAB — VITAMIN B12: Vitamin B-12: 701 pg/mL (ref 200–1100)

## 2020-04-25 LAB — TSH: TSH: 1.48 mIU/L (ref 0.40–4.50)

## 2020-04-25 LAB — VITAMIN D 25 HYDROXY (VIT D DEFICIENCY, FRACTURES): Vit D, 25-Hydroxy: 47 ng/mL (ref 30–100)

## 2020-04-26 DIAGNOSIS — Z23 Encounter for immunization: Secondary | ICD-10-CM | POA: Diagnosis not present

## 2020-05-02 ENCOUNTER — Other Ambulatory Visit: Payer: Self-pay | Admitting: Physician Assistant

## 2020-05-02 DIAGNOSIS — Z1231 Encounter for screening mammogram for malignant neoplasm of breast: Secondary | ICD-10-CM

## 2020-05-08 DIAGNOSIS — Z85828 Personal history of other malignant neoplasm of skin: Secondary | ICD-10-CM | POA: Diagnosis not present

## 2020-05-08 DIAGNOSIS — L821 Other seborrheic keratosis: Secondary | ICD-10-CM | POA: Diagnosis not present

## 2020-05-08 DIAGNOSIS — D692 Other nonthrombocytopenic purpura: Secondary | ICD-10-CM | POA: Diagnosis not present

## 2020-05-13 ENCOUNTER — Other Ambulatory Visit: Payer: Self-pay

## 2020-05-13 ENCOUNTER — Ambulatory Visit (HOSPITAL_COMMUNITY): Payer: Medicare Other | Attending: Cardiovascular Disease

## 2020-05-13 DIAGNOSIS — Z952 Presence of prosthetic heart valve: Secondary | ICD-10-CM | POA: Diagnosis not present

## 2020-05-19 ENCOUNTER — Other Ambulatory Visit: Payer: Self-pay | Admitting: Physician Assistant

## 2020-05-19 MED ORDER — VORTIOXETINE HBR 10 MG PO TABS: 10.0000 mg | ORAL_TABLET | Freq: Every day | ORAL | 3 refills | Status: DC

## 2020-05-19 NOTE — Progress Notes (Signed)
Will fill trintellix for memory

## 2020-06-10 ENCOUNTER — Other Ambulatory Visit: Payer: Self-pay

## 2020-06-10 ENCOUNTER — Ambulatory Visit
Admission: RE | Admit: 2020-06-10 | Discharge: 2020-06-10 | Disposition: A | Discharge status: Home / Self Care | Payer: Medicare Other | Source: Ambulatory Visit | Attending: Physician Assistant | Admitting: Physician Assistant

## 2020-06-10 DIAGNOSIS — R911 Solitary pulmonary nodule: Secondary | ICD-10-CM | POA: Diagnosis not present

## 2020-06-10 DIAGNOSIS — R918 Other nonspecific abnormal finding of lung field: Secondary | ICD-10-CM

## 2020-06-11 ENCOUNTER — Other Ambulatory Visit: Payer: Self-pay | Admitting: Physician Assistant

## 2020-06-11 DIAGNOSIS — I700 Atherosclerosis of aorta: Secondary | ICD-10-CM

## 2020-06-11 DIAGNOSIS — R918 Other nonspecific abnormal finding of lung field: Secondary | ICD-10-CM

## 2020-06-11 DIAGNOSIS — J439 Emphysema, unspecified: Secondary | ICD-10-CM

## 2020-06-11 MED ORDER — VENLAFAXINE HCL ER 37.5 MG PO CP24: 37.5000 mg | ORAL_CAPSULE | Freq: Every day | ORAL | 2 refills | Status: DC

## 2020-06-12 ENCOUNTER — Other Ambulatory Visit: Payer: Self-pay

## 2020-06-12 ENCOUNTER — Ambulatory Visit
Admission: RE | Admit: 2020-06-12 | Discharge: 2020-06-12 | Disposition: A | Discharge status: Home / Self Care | Payer: Medicare Other | Source: Ambulatory Visit | Attending: Physician Assistant | Admitting: Physician Assistant

## 2020-06-12 DIAGNOSIS — Z1231 Encounter for screening mammogram for malignant neoplasm of breast: Secondary | ICD-10-CM

## 2020-06-15 NOTE — Progress Notes (Signed)
Assessment and Plan: [name] was seen today for fatigue.  Diagnoses and all orders for this visit:  Orthostatic hypotension -     CBC with Differential/Platelet -     COMPLETE METABOLIC PANEL WITH GFR -     TSH Not taking the trazadone Increase fluids, add gatorade, liberal with salt Compression socks STOP ETOH- can be making worse  anemia, deficiency, iron -     Iron,Total/Total Iron Binding Cap  Medication management -     TSH -     Magnesium  Thiamine deficiency -     Vitamin B1, whole blood - stop drinking  Depression, major, recurrent, in partial remission (HCC) -     TSH Patient only takes trazadone if she is not drinking- very rarely - advised to stop alcohol Try the effexor daily start low at 37.5mg    The patient was advised to call immediately if she has any concerning symptoms in the interval. The patient voices understanding of current treatment options and is in agreement with the current care plan.The patient knows to call the clinic with any problems, questions or concerns or go to the ER if any further progression of symptoms.   Discussed med's effects and SE's. Screening labs and tests as requested with regular follow-up as recommended. Over 40 minutes of exam, counseling, chart review, and complex, high level critical decision making was performed this visit.  Future Appointments  Date Time Provider [place]  04/27/2021  2:00 PM Vicie Mutters, PA-C [facility] None    HPI  79 y.o. female  presents for follow up for has Alcohol abuse with alcohol-induced disorder ([place]); Medication management; Mixed hyperlipidemia; H/O prosthetic aortic valve replacement; B12 deficiency; Anemia; Vitamin D deficiency; Depression, major, recurrent, in partial remission ([name]); Enrolled in chronic care management; Lung nodules; Lymphocytic colitis; Senile purpura ([place]); Aortic atherosclerosis ([place]); and Emphysema of lung ([place]) on their problem list.   BP: 130/88    She states she has been feeling dizzy. She states she has been lying in bed a lot, she states when she stands she will feel presyncope/dizzy for 2 weeks. Not daily, will be occ and feel weak with it, mainly with standing. She will try to walk 1/2 block, will feel tired afterwards but denies SOB, CP, or dizziness with that. No vertigo or room spinning sensation.  Her BP is normal today. She is not on a blood pressure medications. She continues to drink 1/2 bottle or more of wine a night.    She has a history of diarrhea, history of lymphocytic, is on budesonide ER and pepto 3 x a day- has not been having diarrhea.   She  Effexor for depression from loss of her husband, move, and pandemic, (switched from zoloft, lexapro, trintellix and celexa).  She had recent MGM, will need further evaluation of her right breast.   BMI is Body mass index is 20.6 kg/m. Wt Readings from Last 8 Encounters:  06/16/20 120 lb (54.4 kg)  04/22/20 119 lb (54 kg)  04/09/20 120 lb (54.4 kg)  02/06/20 115 lb (52.2 kg)  01/23/20 117 lb 9.6 oz (53.3 kg)  12/18/19 113 lb 9.6 oz (51.5 kg)  11/30/19 113 lb (51.3 kg)  10/22/19 112 lb (50.8 kg)    Lab Results  Component Value Date   GFRNONAA 87 04/22/2020    Current Medications:  Current Outpatient Medications on File Prior to Visit  Medication Sig Dispense Refill  . acetaminophen (TYLENOL) 325 MG tablet Take 650 mg by  mouth every 6 (six) hours as needed.    Marland Kitchen amoxicillin (AMOXIL) 500 MG capsule Take 2 grams (4 pills) before each of the three procedures. 8 capsule 1  . atorvastatin (LIPITOR) 80 MG tablet TAKE 1 TABLET(80 MG) BY MOUTH DAILY 90 tablet 1  . bismuth subsalicylate (PEPTO BISMOL) 262 MG/15ML suspension Take 30 mLs by mouth every 6 (six) hours as needed.    . Budesonide ER (UCERIS) 9 MG TB24 Take 9 mg by mouth daily. 30 tablet 3  . diphenhydrAMINE (BENADRYL) 25 MG tablet Take 25 mg by mouth every 6 (six) hours as needed.    Marland Kitchen LORazepam (ATIVAN) 0.5 MG  tablet TAKE 1 TABLET BY MOUTH IN THE MORNING, TAKE 1/2 A TABLET BY MOUTH LUNCH AND DINNER AS NEEDED FOR ANXIETY. 60 tablet 0  . traZODone (DESYREL) 50 MG tablet TAKE 1/2-1 TABLET BY MOUTH EVERY NIGHT AT BEDTIME AS NEEDED FOR SLEEP 30 tablet 2  . VITAMIN D PO Take 1 tablet by mouth daily.    Marland Kitchen venlafaxine XR (EFFEXOR XR) 37.5 MG 24 hr capsule Take 1 capsule (37.5 mg total) by mouth daily. (Patient not taking: Reported on 06/16/2020) 30 capsule 2   No current facility-administered medications on file prior to visit.   Allergies:  No Known Allergies Medical History:  She has Alcohol abuse with alcohol-induced disorder ([place]); Medication management; Mixed hyperlipidemia; H/O prosthetic aortic valve replacement; B12 deficiency; Anemia; Vitamin D deficiency; Depression, major, recurrent, in partial remission ([place]); Enrolled in chronic care management; Lung nodules; Lymphocytic colitis; Senile purpura ([place]); Aortic atherosclerosis ([place]); and Emphysema of lung ([place]) on their problem list.    Surgical History:  She has a past surgical history that includes Cardiac valuve replacement (1999) and Cardiac valuve replacement (2016). Family History:  Herfamily history includes Cancer in her mother; Hyperlipidemia in her mother; Hypertension in her mother; Kidney disease in her father; Parkinson's disease in her father; Parkinsonism in her father. Social History:  She reports that she quit smoking about 41 years ago. Her smoking use included cigarettes. She has never used smokeless tobacco. She reports current alcohol use of about 14.0 standard drinks of alcohol per week. She reports that she does not use drugs.   Review of Systems: Review of Systems  Constitutional: Positive for malaise/fatigue. Negative for chills, diaphoresis, fever and weight loss.  HENT: Negative.   Eyes: Negative.   Respiratory: Negative.   Cardiovascular: Negative.   Gastrointestinal: Negative for diarrhea.  Genitourinary:  Negative.   Musculoskeletal: Negative for back pain, falls, joint pain, myalgias and neck pain.  Skin: Negative.   Neurological: Positive for dizziness. Negative for tingling, tremors, sensory change, speech change, focal weakness, seizures, loss of consciousness, weakness and headaches.  Endo/Heme/Allergies: Negative.   Psychiatric/Behavioral: Positive for depression and memory loss. Negative for hallucinations, substance abuse and suicidal ideas. The patient is not nervous/anxious and does not have insomnia.      Physical Exam: Estimated body mass index is 20.6 kg/m as calculated from the following:   Height as of 04/22/20: 5\' 4"  (1.626 m).   Weight as of this encounter: 120 lb (54.4 kg). BP 130/88   Pulse 81   Temp 97.9 F (36.6 C)   Wt 120 lb (54.4 kg)   SpO2 98%   BMI 20.60 kg/m  General Appearance: elderly frail appearing female in no apparent distress Eyes: PERRLA, EOMs, conjunctiva no swelling or erythema Sinuses: No Frontal/maxillary tenderness ENT/Mouth: Ext aud canals clear, normal light reflex with TMs without erythema, bulging.  No erythema, swelling, or exudate on post pharynx. Tonsils not swollen or erythematous. Hearing good Neck: Supple, thyroid normal. No bruits  Respiratory: Respiratory effort normal, BS equal bilaterally without rales, rhonchi, wheezing or stridor.  Cardio: RRR with 2/6 systolic murmur without rubs or gallops.  Chest: symmetric, with normal excursions and percussion.  Abdomen: Soft, nontender, no guarding, rebound, hernias, masses, or organomegaly.  Lymphatics: Non tender without lymphadenopathy.  Musculoskeletal: Full ROM all peripheral extremities, good grip strength, antalgic, unsteady gait.  Skin: Warm, dry without rashes, lesions, on arms and leg ecchymosis. Neuro: Cranial nerves intact,,no cog wheeling, Normal muscle tone, wide based stance,   Psych: Awake and oriented X 3, normal affect, Insight and Judgment appropriate    Vicie Mutters 11:39 AM [facility]

## 2020-06-16 ENCOUNTER — Ambulatory Visit (INDEPENDENT_AMBULATORY_CARE_PROVIDER_SITE_OTHER): Payer: Medicare Other | Admitting: Physician Assistant

## 2020-06-16 ENCOUNTER — Other Ambulatory Visit: Payer: Self-pay

## 2020-06-16 ENCOUNTER — Other Ambulatory Visit: Payer: Self-pay | Admitting: Physician Assistant

## 2020-06-16 VITALS — BP 130/88 | HR 81 | Temp 97.9°F | Wt 120.0 lb

## 2020-06-16 DIAGNOSIS — I951 Orthostatic hypotension: Secondary | ICD-10-CM

## 2020-06-16 DIAGNOSIS — Z79899 Other long term (current) drug therapy: Secondary | ICD-10-CM

## 2020-06-16 DIAGNOSIS — R928 Other abnormal and inconclusive findings on diagnostic imaging of breast: Secondary | ICD-10-CM

## 2020-06-16 DIAGNOSIS — E538 Deficiency of other specified B group vitamins: Secondary | ICD-10-CM | POA: Diagnosis not present

## 2020-06-16 DIAGNOSIS — Z130 Encounter for screening for diseases of the blood and blood-forming organs and certain disorders involving the immune mechanism: Secondary | ICD-10-CM | POA: Diagnosis not present

## 2020-06-16 DIAGNOSIS — F3341 Major depressive disorder, recurrent, in partial remission: Secondary | ICD-10-CM

## 2020-06-16 DIAGNOSIS — E519 Thiamine deficiency, unspecified: Secondary | ICD-10-CM

## 2020-06-16 NOTE — Patient Instructions (Addendum)
Do effexor 37.5 mg a day for 1-2 weeks, and then increase to 2 a day Let me know how you do I can send in a 75mg  a day effexor  WEIGHT GAIN  Try to make sure you are eating plenty of high calorie dense foods like: Avocado Nuts Peanut butter Oatmeal Dates Cottage cheese Greek yogurt Protein powder   Increase water Add gatorade Cut back on alcohol Be liberal with your salt Add compression stockings  COMPRESSION STOCKINGS  HOME CARE INSTRUCTIONS   Do not stand or sit in one position for long periods of time. Do not sit with your legs crossed. Rest with your legs raised during the day.  Your legs have to be higher than your heart so that gravity will force the valves to open, so please really elevate your legs.   Wear elastic stockings or support hose. Do not wear other tight, encircling garments around the legs, pelvis, or waist.  [employer]  has a wide variety of well priced compression stockings. [place], [place] [address] [address] [place] has a good cheap selection, I like the socks, they are not as hard to get on  Walk as much as possible to increase blood flow.  Raise the foot of your bed at night with 2-inch blocks. SEEK MEDICAL CARE IF:   The skin around your ankle starts to break down.  You have pain, redness, tenderness, or hard swelling developing in your leg over a vein.  You are uncomfortable due to leg pain. Document Released: 09/22/2005 Document Revised: 03/06/2012 Document Reviewed: 02/08/2011 [facility] Patient Information 2014 [place].   Orthostatic Hypotension Blood pressure is a measurement of how strongly, or weakly, your blood is pressing against the walls of your arteries. Orthostatic hypotension is a sudden drop in blood pressure that happens when you quickly change positions, such as when you get up from sitting or lying down. Arteries are blood vessels that carry blood from your heart throughout your body.  When blood pressure is too low, you may not get enough blood to your brain or to the rest of your organs. This can cause weakness, light-headedness, rapid heartbeat, and fainting. This can last for just a few seconds or for up to a few minutes. Orthostatic hypotension is usually not a serious problem. However, if it happens frequently or gets worse, it may be a sign of something more serious. What are the causes? This condition may be caused by:  Sudden changes in posture, such as standing up quickly after you have been sitting or lying down.  Blood loss.  Loss of body fluids (dehydration).  Heart problems.  Hormone (endocrine) problems.  Pregnancy.  Severe infection.  Lack of certain nutrients.  Severe allergic reactions (anaphylaxis).  Certain medicines, such as blood pressure medicine or medicines that make the body lose excess fluids (diuretics). Sometimes, this condition can be caused by not taking medicine as directed, such as taking too much of a certain medicine. What increases the risk? The following factors may make you more likely to develop this condition:  Age. Risk increases as you get older.  Conditions that affect the heart or the central nervous system.  Taking certain medicines, such as blood pressure medicine or diuretics.  Being pregnant. What are the signs or symptoms? Symptoms of this condition may include:  Weakness.  Light-headedness.  Dizziness.  Blurred vision.  Fatigue.  Rapid heartbeat.  Fainting, in severe cases. How is this diagnosed? This  condition is diagnosed based on:  Your medical history.  Your symptoms.  Your blood pressure measurement. Your health care provider will check your blood pressure when you are: ? Lying down. ? Sitting. ? Standing. A blood pressure reading is recorded as two numbers, such as "120 over 80" (or 120/80). The first ("top") number is called the systolic pressure. It is a measure of the pressure in  your arteries as your heart beats. The second ("bottom") number is called the diastolic pressure. It is a measure of the pressure in your arteries when your heart relaxes between beats. Blood pressure is measured in a unit called mm Hg. Healthy blood pressure for most adults is 120/80. If your blood pressure is below 90/60, you may be diagnosed with hypotension. Other information or tests that may be used to diagnose orthostatic hypotension include:  Your other vital signs, such as your heart rate and temperature.  Blood tests.  Tilt table test. For this test, you will be safely secured to a table that moves you from a lying position to an upright position. Your heart rhythm and blood pressure will be monitored during the test. How is this treated? This condition may be treated by:  Changing your diet. This may involve eating more salt (sodium) or drinking more water.  Taking medicines to raise your blood pressure.  Changing the dosage of certain medicines you are taking that might be lowering your blood pressure.  Wearing compression stockings. These stockings help to prevent blood clots and reduce swelling in your legs. In some cases, you may need to go to the hospital for:  Fluid replacement. This means you will receive fluids through an IV.  Blood replacement. This means you will receive donated blood through an IV (transfusion).  Treating an infection or heart problems, if this applies.  Monitoring. You may need to be monitored while medicines that you are taking wear off. Follow these instructions at home: Eating and drinking   Drink enough fluid to keep your urine pale yellow.  Eat a healthy diet, and follow instructions from your health care provider about eating or drinking restrictions. A healthy diet includes: ? Fresh fruits and vegetables. ? Whole grains. ? Lean meats. ? Low-fat dairy products.  Eat extra salt only as directed. Do not add extra salt to your diet  unless your health care provider told you to do that.  Eat frequent, small meals.  Avoid standing up suddenly after eating. Medicines  Take over-the-counter and prescription medicines only as told by your health care provider. ? Follow instructions from your health care provider about changing the dosage of your current medicines, if this applies. ? Do not stop or adjust any of your medicines on your own. General instructions   Wear compression stockings as told by your health care provider.  Get up slowly from lying down or sitting positions. This gives your blood pressure a chance to adjust.  Avoid hot showers and excessive heat as directed by your health care provider.  Return to your normal activities as told by your health care provider. Ask your health care provider what activities are safe for you.  Do not use any products that contain nicotine or tobacco, such as cigarettes, e-cigarettes, and chewing tobacco. If you need help quitting, ask your health care provider.  Keep all follow-up visits as told by your health care provider. This is important. Contact a health care provider if you:  Vomit.  Have diarrhea.  Have a fever  for more than 2-3 days.  Feel more thirsty than usual.  Feel weak and tired. Get help right away if you:  Have chest pain.  Have a fast or irregular heartbeat.  Develop numbness in any part of your body.  Cannot move your arms or your legs.  Have trouble speaking.  Become sweaty or feel light-headed.  Faint.  Feel short of breath.  Have trouble staying awake.  Feel confused. Summary  Orthostatic hypotension is a sudden drop in blood pressure that happens when you quickly change positions.  Orthostatic hypotension is usually not a serious problem.  It is diagnosed by having your blood pressure taken lying down, sitting, and then standing.  It may be treated by changing your diet or adjusting your medicines. This information  is not intended to replace advice given to you by your health care provider. Make sure you discuss any questions you have with your health care provider. Document Revised: 06/08/2018 Document Reviewed: 06/08/2018 Elsevier Patient Education  [place].

## 2020-06-20 LAB — COMPLETE METABOLIC PANEL WITH GFR
AG Ratio: 1.4 (calc) (ref 1.0–2.5)
ALT: 23 U/L (ref 6–29)
AST: 30 U/L (ref 10–35)
Albumin: 3.9 g/dL (ref 3.6–5.1)
Alkaline phosphatase (APISO): 65 U/L (ref 37–153)
BUN: 18 mg/dL (ref 7–25)
CO2: 29 mmol/L (ref 20–32)
Calcium: 10 mg/dL (ref 8.6–10.4)
Chloride: 103 mmol/L (ref 98–110)
Creat: 0.66 mg/dL (ref 0.60–0.93)
GFR, Est African American: 97 mL/min/{1.73_m2} (ref >=60)
GFR, Est Non African American: 84 mL/min/{1.73_m2} (ref >=60)
Globulin: 2.7 g/dL (calc) (ref 1.9–3.7)
Glucose, Bld: 100 mg/dL — ABNORMAL HIGH (ref 65–99)
Potassium: 4.7 mmol/L (ref 3.5–5.3)
Sodium: 141 mmol/L (ref 135–146)
Total Bilirubin: 0.8 mg/dL (ref 0.2–1.2)
Total Protein: 6.6 g/dL (ref 6.1–8.1)

## 2020-06-20 LAB — VITAMIN B1, WHOLE BLOOD: Vitamin B1 (Thiamine), Blood: 333 nmol/L — ABNORMAL HIGH (ref 78–185)

## 2020-06-20 LAB — CBC WITH DIFFERENTIAL/PLATELET
Absolute Monocytes: 583 cells/uL (ref 200–950)
Basophils Absolute: 39 cells/uL (ref 0–200)
Basophils Relative: 0.7 %
Eosinophils Absolute: 72 cells/uL (ref 15–500)
Eosinophils Relative: 1.3 %
HCT: 38.9 % (ref 35.0–45.0)
Hemoglobin: 13.1 g/dL (ref 11.7–15.5)
Lymphs Abs: 902 cells/uL (ref 850–3900)
MCH: 31.3 pg (ref 27.0–33.0)
MCHC: 33.7 g/dL (ref 32.0–36.0)
MCV: 92.8 fL (ref 80.0–100.0)
MPV: 12 fL (ref 7.5–12.5)
Monocytes Relative: 10.6 %
Neutro Abs: 3905 cells/uL (ref 1500–7800)
Neutrophils Relative %: 71 %
Platelets: 159 10*3/uL (ref 140–400)
RBC: 4.19 10*6/uL (ref 3.80–5.10)
RDW: 13.2 % (ref 11.0–15.0)
Total Lymphocyte: 16.4 %
WBC: 5.5 10*3/uL (ref 3.8–10.8)

## 2020-06-20 LAB — IRON, TOTAL/TOTAL IRON BINDING CAP
%SAT: 24 % (calc) (ref 16–45)
Iron: 86 ug/dL (ref 45–160)
TIBC: 359 mcg/dL (calc) (ref 250–450)

## 2020-06-20 LAB — MAGNESIUM: Magnesium: 2.1 mg/dL (ref 1.5–2.5)

## 2020-06-20 LAB — TSH: TSH: 1.16 mIU/L (ref 0.40–4.50)

## 2020-07-01 ENCOUNTER — Other Ambulatory Visit: Payer: Self-pay

## 2020-07-01 ENCOUNTER — Ambulatory Visit
Admission: RE | Admit: 2020-07-01 | Discharge: 2020-07-01 | Disposition: A | Discharge status: Home / Self Care | Payer: Medicare Other | Source: Ambulatory Visit | Attending: Physician Assistant | Admitting: Physician Assistant

## 2020-07-01 DIAGNOSIS — R928 Other abnormal and inconclusive findings on diagnostic imaging of breast: Secondary | ICD-10-CM

## 2020-07-05 ENCOUNTER — Other Ambulatory Visit: Payer: Self-pay | Admitting: Gastroenterology

## 2020-07-15 MED ORDER — AMOXICILLIN-POT CLAVULANATE 875-125 MG PO TABS: 1.0000 | ORAL_TABLET | Freq: Two times a day (BID) | ORAL | 0 refills | Status: AC

## 2020-08-10 DIAGNOSIS — Z20828 Contact with and (suspected) exposure to other viral communicable diseases: Secondary | ICD-10-CM | POA: Diagnosis not present

## 2020-08-11 ENCOUNTER — Other Ambulatory Visit: Payer: Self-pay | Admitting: Cardiovascular Disease

## 2020-08-11 NOTE — Telephone Encounter (Signed)
Ampicillin refill request refused - patient has amoxicillin listed on chart PRN for dental work

## 2020-08-13 ENCOUNTER — Other Ambulatory Visit: Payer: Self-pay | Admitting: Cardiovascular Disease

## 2020-08-13 NOTE — Telephone Encounter (Signed)
Rx has been sent to the pharmacy electronically. ° °

## 2020-08-17 DIAGNOSIS — Z20828 Contact with and (suspected) exposure to other viral communicable diseases: Secondary | ICD-10-CM | POA: Diagnosis not present

## 2020-08-29 ENCOUNTER — Other Ambulatory Visit: Payer: Self-pay | Admitting: Physician Assistant

## 2020-09-02 ENCOUNTER — Ambulatory Visit (INDEPENDENT_AMBULATORY_CARE_PROVIDER_SITE_OTHER): Payer: Medicare Other | Admitting: Physician Assistant

## 2020-09-02 ENCOUNTER — Encounter: Payer: Self-pay | Admitting: Physician Assistant

## 2020-09-02 ENCOUNTER — Other Ambulatory Visit: Payer: Self-pay

## 2020-09-02 VITALS — BP 120/68 | HR 94 | Temp 97.5°F | Wt 123.4 lb

## 2020-09-02 DIAGNOSIS — D649 Anemia, unspecified: Secondary | ICD-10-CM | POA: Diagnosis not present

## 2020-09-02 DIAGNOSIS — D692 Other nonthrombocytopenic purpura: Secondary | ICD-10-CM | POA: Diagnosis not present

## 2020-09-02 DIAGNOSIS — R58 Hemorrhage, not elsewhere classified: Secondary | ICD-10-CM

## 2020-09-02 DIAGNOSIS — E538 Deficiency of other specified B group vitamins: Secondary | ICD-10-CM

## 2020-09-02 DIAGNOSIS — F1019 Alcohol abuse with unspecified alcohol-induced disorder: Secondary | ICD-10-CM

## 2020-09-02 NOTE — Patient Instructions (Signed)
Senile purpura- check it out   Contusion A contusion is a deep bruise. Contusions are the result of a blunt injury to tissues and muscle fibers under the skin. The injury causes bleeding under the skin. The skin overlying the contusion may turn blue, purple, or yellow. Minor injuries will give you a painless contusion, but more severe injuries cause contusions that may stay painful and swollen for a few weeks. Follow these instructions at home: Pay attention to any changes in your symptoms. Let your health care provider know about them. Take these actions to relieve your pain. Managing pain, stiffness, and swelling   Use resting, icing, applying pressure (compression), and raising (elevating) the injured area. This is often called the RICE strategy. ? Rest the injured area. Return to your normal activities as told by your health care provider. Ask your health care provider what activities are safe for you. ? If directed, put ice on the injured area:  Put ice in a plastic bag.  Place a towel between your skin and the bag.  Leave the ice on for 20 minutes, 2-3 times per day. ? If directed, apply light compression to the injured area using an elastic bandage. Make sure the bandage is not wrapped too tightly. Remove and reapply the bandage as directed by your health care provider. ? If possible, raise (elevate) the injured area above the level of your heart while you are sitting or lying down. General instructions  Take over-the-counter and prescription medicines only as told by your health care provider.  Keep all follow-up visits as told by your health care provider. This is important. Contact a health care provider if:  Your symptoms do not improve after several days of treatment.  Your symptoms get worse.  You have difficulty moving the injured area. Get help right away if:  You have severe pain.  You have numbness in a hand or foot.  Your hand or foot turns pale or  cold. Summary  A contusion is a deep bruise.  Contusions are the result of a blunt injury to tissues and muscle fibers under the skin.  It is treated with rest, ice, compression, and elevation. You may be given over-the-counter medicines for pain.  Contact a health care provider if your symptoms do not improve, or get worse.  Get help right away if you have severe pain, have numbness, or the area turns pale or cold. This information is not intended to replace advice given to you by your health care provider. Make sure you discuss any questions you have with your health care provider. Document Revised: 08/03/2018 Document Reviewed: 08/03/2018 Elsevier Patient Education  [place].

## 2020-09-02 NOTE — Progress Notes (Signed)
Subjective:    Patient ID: Connie Alvarado, female    DOB: 01/31/1941, 79 y.o.   MRN: [id number]  HPI 79 y.o. WF with history of alcohol abuse, chol, prosthetic aortic valve, B12 def/anemia, depression, colitis, senile purpura, COPD presents with bruising.    She has bruising on her arms and legs, no nose bleeds, no bruising anywhere else, no blood in stool/urine. No dizziness. She does drink wine nightly and have fallen while drinking.   She is not on a blood thinner. Her last platelets were normal, normal LFTs.  Lab Results  Component Value Date   ALT 23 06/16/2020   AST 30 06/16/2020   BILITOT 0.8 06/16/2020   Lab Results  Component Value Date   WBC 5.5 06/16/2020   HGB 13.1 06/16/2020   HCT 38.9 06/16/2020   MCV 92.8 06/16/2020   PLT 159 06/16/2020   Lab Results  Component Value Date   IRON 86 06/16/2020   TIBC 359 06/16/2020    Blood pressure 120/68, pulse 94, temperature (!) 97.5 F (36.4 C), weight 123 lb 6.4 oz (56 kg), SpO2 94 %.  Medications  Current Outpatient Medications (Endocrine & Metabolic):    Budesonide ER 9 MG TB24, TAKE 1 TABLET BY MOUTH DAILY  Current Outpatient Medications (Cardiovascular):    atorvastatin (LIPITOR) 80 MG tablet, TAKE 1 TABLET(80 MG) BY MOUTH DAILY  Current Outpatient Medications (Respiratory):    diphenhydrAMINE (BENADRYL) 25 MG tablet, Take 25 mg by mouth every 6 (six) hours as needed.  Current Outpatient Medications (Analgesics):    acetaminophen (TYLENOL) 325 MG tablet, Take 650 mg by mouth every 6 (six) hours as needed.   Current Outpatient Medications (Other):    amoxicillin (AMOXIL) 500 MG capsule, Take 2 grams (4 pills) before each of the three procedures.   ampicillin (PRINCIPEN) 500 MG capsule, TAKE 4 CAPSULES BY MOUTH BEFORE EACH OF THE THREE PROCEDURE   bismuth subsalicylate (PEPTO BISMOL) 262 MG/15ML suspension, Take 30 mLs by mouth every 6 (six) hours as needed.   LORazepam (ATIVAN) 0.5 MG tablet, TAKE 1  TABLET BY MOUTH IN THE MORNING, TAKE 1/2 A TABLET BY MOUTH LUNCH AND DINNER AS NEEDED FOR ANXIETY.   traZODone (DESYREL) 50 MG tablet, TAKE 1/2-1 TABLET BY MOUTH EVERY NIGHT AT BEDTIME AS NEEDED FOR SLEEP   venlafaxine XR (EFFEXOR-XR) 37.5 MG 24 hr capsule, Take 1 capsule Daily for Mood   VITAMIN D PO, Take 1 tablet by mouth daily.  Problem list She has Alcohol abuse with alcohol-induced disorder ([place]); Medication management; Mixed hyperlipidemia; H/O prosthetic aortic valve replacement; B12 deficiency; Anemia; Vitamin D deficiency; Depression, major, recurrent, in partial remission ([place]); Enrolled in chronic care management; Lung nodules; Lymphocytic colitis; Senile purpura ([place]); Aortic atherosclerosis ([place]); and Emphysema of lung ([place]) on their problem list.  Review of Systems     Objective:   Physical Exam General Appearance: elderly frail appearing female in no apparent distress Eyes: PERRLA, EOMs, conjunctiva no swelling or erythema Sinuses: No Frontal/maxillary tenderness ENT/Mouth: Ext aud canals clear, normal light reflex with TMs without erythema, bulging.  No erythema, swelling, or exudate on post pharynx. Tonsils not swollen or erythematous. Hearing good Neck: Supple, thyroid normal. No bruits  Respiratory: Respiratory effort normal, BS equal bilaterally without rales, rhonchi, wheezing or stridor.  Cardio: RRR with 2/6 systolic murmur without rubs or gallops.  Chest: symmetric, with normal excursions and percussion.  Abdomen: Soft, nontender, no guarding, rebound, hernias, masses, or organomegaly.  Lymphatics: Non tender without lymphadenopathy.  Musculoskeletal: Full ROM all peripheral extremities, good grip strength,  unsteady gait.  Skin: Warm, dry without rashes, lesions, + bilateral arm and anterior shin with ecchymosis, no other hematoma. Neuro: Cranial nerves intact,no cog wheeling, Normal muscle tone, wide based stance,   Psych: Awake and oriented X 3, normal affect,  Insight and Judgment appropriate      Assessment & Plan:   Alcohol abuse with alcohol-induced disorder ([place]) Suggest stopping ETOH use  Anemia, unspecified type -     Iron,Total/Total Iron Binding Cap -     Ferritin  Senile purpura ([place]) Discussed process, protect skin, sunscreen  Ecchymosis- likely senile purpura Will get labs with ETOH use Suggest stopping ETOH -     CBC with Differential/Platelet -     COMPLETE METABOLIC PANEL WITH GFR -     Protime-INR    Future Appointments  Date Time Provider [place]  04/27/2021  2:00 PM Vicie Mutters, PA-C [facility] None

## 2020-09-03 LAB — CBC WITH DIFFERENTIAL/PLATELET
Absolute Monocytes: 600 cells/uL (ref 200–950)
Basophils Absolute: 38 cells/uL (ref 0–200)
Basophils Relative: 0.5 %
Eosinophils Absolute: 98 cells/uL (ref 15–500)
Eosinophils Relative: 1.3 %
HCT: 40.5 % (ref 35.0–45.0)
Hemoglobin: 13.5 g/dL (ref 11.7–15.5)
Lymphs Abs: 675 cells/uL — ABNORMAL LOW (ref 850–3900)
MCH: 31.1 pg (ref 27.0–33.0)
MCHC: 33.3 g/dL (ref 32.0–36.0)
MCV: 93.3 fL (ref 80.0–100.0)
MPV: 12.2 fL (ref 7.5–12.5)
Monocytes Relative: 8 %
Neutro Abs: 6090 cells/uL (ref 1500–7800)
Neutrophils Relative %: 81.2 %
Platelets: 168 10*3/uL (ref 140–400)
RBC: 4.34 10*6/uL (ref 3.80–5.10)
RDW: 12.5 % (ref 11.0–15.0)
Total Lymphocyte: 9 %
WBC: 7.5 10*3/uL (ref 3.8–10.8)

## 2020-09-03 LAB — COMPLETE METABOLIC PANEL WITH GFR
AG Ratio: 1.3 (calc) (ref 1.0–2.5)
ALT: 28 U/L (ref 6–29)
AST: 28 U/L (ref 10–35)
Albumin: 3.9 g/dL (ref 3.6–5.1)
Alkaline phosphatase (APISO): 71 U/L (ref 37–153)
BUN: 18 mg/dL (ref 7–25)
CO2: 26 mmol/L (ref 20–32)
Calcium: 9.7 mg/dL (ref 8.6–10.4)
Chloride: 104 mmol/L (ref 98–110)
Creat: 0.6 mg/dL (ref 0.60–0.88)
GFR, Est African American: 100 mL/min/{1.73_m2} (ref >=60)
GFR, Est Non African American: 86 mL/min/{1.73_m2} (ref >=60)
Globulin: 3 g/dL (calc) (ref 1.9–3.7)
Glucose, Bld: 126 mg/dL — ABNORMAL HIGH (ref 65–99)
Potassium: 4.4 mmol/L (ref 3.5–5.3)
Sodium: 140 mmol/L (ref 135–146)
Total Bilirubin: 0.8 mg/dL (ref 0.2–1.2)
Total Protein: 6.9 g/dL (ref 6.1–8.1)

## 2020-09-03 LAB — PROTIME-INR
INR: 1
Prothrombin Time: 10.6 s (ref 9.0–11.5)

## 2020-09-03 LAB — FERRITIN: Ferritin: 54 ng/mL (ref 16–288)

## 2020-09-03 LAB — IRON, TOTAL/TOTAL IRON BINDING CAP
%SAT: 12 % (calc) — ABNORMAL LOW (ref 16–45)
Iron: 45 ug/dL (ref 45–160)
TIBC: 368 mcg/dL (calc) (ref 250–450)

## 2020-09-26 DIAGNOSIS — Z85828 Personal history of other malignant neoplasm of skin: Secondary | ICD-10-CM | POA: Diagnosis not present

## 2020-09-26 DIAGNOSIS — D692 Other nonthrombocytopenic purpura: Secondary | ICD-10-CM | POA: Diagnosis not present

## 2020-09-26 DIAGNOSIS — L821 Other seborrheic keratosis: Secondary | ICD-10-CM | POA: Diagnosis not present

## 2020-09-26 DIAGNOSIS — D1801 Hemangioma of skin and subcutaneous tissue: Secondary | ICD-10-CM | POA: Diagnosis not present

## 2020-10-10 DIAGNOSIS — Z23 Encounter for immunization: Secondary | ICD-10-CM | POA: Diagnosis not present

## 2020-11-02 ENCOUNTER — Other Ambulatory Visit: Payer: Self-pay | Admitting: Gastroenterology

## 2020-11-02 NOTE — Telephone Encounter (Signed)
Must have office follow-up prior to additional refills

## 2020-11-04 ENCOUNTER — Encounter: Payer: Self-pay | Admitting: Adult Health Nurse Practitioner

## 2020-11-04 ENCOUNTER — Other Ambulatory Visit: Payer: Self-pay

## 2020-11-04 ENCOUNTER — Ambulatory Visit (INDEPENDENT_AMBULATORY_CARE_PROVIDER_SITE_OTHER): Payer: Medicare Other | Admitting: Adult Health Nurse Practitioner

## 2020-11-04 VITALS — BP 128/80 | HR 86 | Temp 97.7°F | Ht 64.0 in | Wt 125.8 lb

## 2020-11-04 DIAGNOSIS — I951 Orthostatic hypotension: Secondary | ICD-10-CM | POA: Diagnosis not present

## 2020-11-04 DIAGNOSIS — E538 Deficiency of other specified B group vitamins: Secondary | ICD-10-CM

## 2020-11-04 DIAGNOSIS — Z79899 Other long term (current) drug therapy: Secondary | ICD-10-CM

## 2020-11-04 DIAGNOSIS — I700 Atherosclerosis of aorta: Secondary | ICD-10-CM | POA: Diagnosis not present

## 2020-11-04 DIAGNOSIS — F331 Major depressive disorder, recurrent, moderate: Secondary | ICD-10-CM | POA: Diagnosis not present

## 2020-11-04 DIAGNOSIS — E519 Thiamine deficiency, unspecified: Secondary | ICD-10-CM

## 2020-11-04 DIAGNOSIS — R413 Other amnesia: Secondary | ICD-10-CM

## 2020-11-04 DIAGNOSIS — F1019 Alcohol abuse with unspecified alcohol-induced disorder: Secondary | ICD-10-CM

## 2020-11-04 DIAGNOSIS — D692 Other nonthrombocytopenic purpura: Secondary | ICD-10-CM

## 2020-11-04 NOTE — Progress Notes (Signed)
FOLLOW UP 3 MONTH   Assessment and Plan:  Connie Alvarado was seen today for follow-up.  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 Discussed use with patient at length Discussed no longer prescribing lorazepam with continued behaviros Concerned for patient safety and falls -     Vitamin B1  Depression, major, recurrent ([place]) Has stopped taking medication Restart taking 37.5 mg daily Discussed [facility] for more formal counseling She is also see counselor/companion   Aortic atherosclerosis ([place]) Per CT 06/10/20 Control blood pressure, lipids and glucose Disscused lifestyle modifications, diet & exercise Continue to monitor  Senile purpura ([place]) Discussed process, protect skin, sunscreen Worsening by use of alcohol  B12 deficiency Not taking supplementation at this time -     Vitamin B12  Medication management -     CBC with Differential/Platelet -     COMPLETE METABOLIC PANEL WITH GFR  Memory impairment of gradual onset Discussed medication dispensing should be manage by daughter Patient is agreeable to this in the office    The patient was advised to call immediately if she has any concerning symptoms in the interval. The patient voices understanding of current treatment options and is in agreement with the current care plan.The patient knows to call the clinic with any problems, questions or concerns or go to the ER if any further progression of symptoms.   Discussed med's effects and SE's. Screening labs and tests as requested with regular follow-up as recommended. Over 40 minutes of exam, counseling, chart review, and complex, high level critical decision making was performed this visit.  Future Appointments  Date Time Provider [place]  11/26/2020  1:45 PM Garnet Sierras, NP [facility] None   04/27/2021 11:30 AM Mashell Sieben, Danton Sewer, NP [facility] None    HPI  79 y.o. female  presents for follow up for has Alcohol abuse with alcohol-induced disorder ([place]); Medication management; Mixed hyperlipidemia; H/O prosthetic aortic valve replacement; B12 deficiency; Anemia; Vitamin D deficiency; Depression, major, recurrent, in partial remission ([place]); Enrolled in chronic care management; Lung nodules; Lymphocytic colitis; Senile purpura ([place]); Aortic atherosclerosis ([place]); and Emphysema of lung ([place]) on their problem list.   BP: 128/80   She states she has been feeling dizzy. She states she has been lying in bed a lot, she states when she stands she will feel presyncope/dizzy for 2 weeks. Not daily, will be occ and feel weak with it, mainly with standing. She will try to walk 1/2 block, will feel tired afterwards but denies SOB, CP, or dizziness with that. No vertigo or room spinning sensation.  Her BP is normal today. She is not on a blood pressure medications. She continues to drink 1/2 bottle or more of wine a night.    She has a history of diarrhea, history of lymphocytic, is on budesonide ER and pepto 3 x a day- has not been having diarrhea. This does return if she drinks a whole bottle of wine.    She  Effexor for depression from loss of her husband, move, and pandemic, (switched from zoloft, lexapro, trintellix and celexa).  She has decreased the amount she takes and reports taking every other day, daghter is skeptical of this related to number of pills left in bottle.  Reports she maybe drinks one bottle of water a day and maybe one can of seltzer flavored water.  BMI is Body mass index is 21.59 kg/m. Wt  Readings from Last 8 Encounters:  11/04/20 125 lb 12.8 oz (57.1 kg)  09/02/20 123 lb 6.4 oz (56 kg)  06/16/20 120 lb (54.4 kg)  04/22/20 119 lb (54 kg)  04/09/20 120 lb (54.4 kg)  02/06/20 115 lb (52.2 kg)  01/23/20 117 lb 9.6 oz (53.3 kg)  12/18/19 113 lb 9.6 oz (51.5 kg)     Lab Results  Component Value Date   GFRNONAA 86 09/02/2020    Current Medications:  Current Outpatient Medications on File Prior to Visit  Medication Sig Dispense Refill  . acetaminophen (TYLENOL) 325 MG tablet Take 650 mg by mouth every 6 (six) hours as needed.    Marland Kitchen atorvastatin (LIPITOR) 80 MG tablet TAKE 1 TABLET(80 MG) BY MOUTH DAILY 90 tablet 1  . bismuth subsalicylate (PEPTO BISMOL) 262 MG/15ML suspension Take 30 mLs by mouth every 6 (six) hours as needed.    . Budesonide ER 9 MG TB24 TAKE 1 TABLET BY MOUTH DAILY 30 tablet 3  . diphenhydrAMINE (BENADRYL) 25 MG tablet Take 25 mg by mouth every 6 (six) hours as needed.    Marland Kitchen LORazepam (ATIVAN) 0.5 MG tablet TAKE 1 TABLET BY MOUTH IN THE MORNING, TAKE 1/2 A TABLET BY MOUTH LUNCH AND DINNER AS NEEDED FOR ANXIETY. 60 tablet 0  . traZODone (DESYREL) 50 MG tablet TAKE 1/2-1 TABLET BY MOUTH EVERY NIGHT AT BEDTIME AS NEEDED FOR SLEEP 30 tablet 2  . venlafaxine XR (EFFEXOR-XR) 37.5 MG 24 hr capsule Take 1 capsule Daily for Mood 90 capsule 2  . VITAMIN D PO Take 1 tablet by mouth daily.    Marland Kitchen amoxicillin (AMOXIL) 500 MG capsule Take 2 grams (4 pills) before each of the three procedures. (Patient not taking: Reported on 11/04/2020) 8 capsule 1  . ampicillin (PRINCIPEN) 500 MG capsule TAKE 4 CAPSULES BY MOUTH BEFORE EACH OF THE THREE PROCEDURE (Patient not taking: Reported on 11/04/2020) 8 capsule 0   No current facility-administered medications on file prior to visit.   Allergies:  No Known Allergies Medical History:  She has Alcohol abuse with alcohol-induced disorder ([place]); Medication management; Mixed hyperlipidemia; H/O prosthetic aortic valve replacement; B12 deficiency; Anemia; Vitamin D deficiency; Depression, major, recurrent, in partial remission ([place]); Enrolled in chronic care management; Lung nodules; Lymphocytic colitis; Senile purpura ([place]); Aortic atherosclerosis ([place]); and Emphysema of lung ([place]) on their problem list.     Surgical History:  She has a past surgical history that includes Cardiac valuve replacement (1999) and Cardiac valuve replacement (2016). Family History:  Herfamily history includes Cancer in her mother; Hyperlipidemia in her mother; Hypertension in her mother; Kidney disease in her father; Parkinson's disease in her father; Parkinsonism in her father. Social History:  She reports that she quit smoking about 41 years ago. Her smoking use included cigarettes. She has never used smokeless tobacco. She reports current alcohol use of about 14.0 standard drinks of alcohol per week. She reports that she does not use drugs.   Review of Systems: Review of Systems  Constitutional: Negative for chills, fever and weight loss.  HENT: Negative for hearing loss and tinnitus.   Eyes: Negative for blurred vision and double vision.  Respiratory: Negative for cough.   Cardiovascular: Negative for chest pain and palpitations.  Gastrointestinal: Negative for abdominal pain, constipation, diarrhea, heartburn, nausea and vomiting.  Genitourinary: Negative for dysuria and urgency.  Musculoskeletal: Negative for myalgias and neck pain.  Skin: Negative for itching and rash.  Neurological: Negative for dizziness, tingling, tremors and headaches.  Endo/Heme/Allergies: Negative for environmental allergies. Bruises/bleeds easily.  Psychiatric/Behavioral: Positive for depression, memory loss and substance abuse. Negative for suicidal ideas. The patient is nervous/anxious.      Physical Exam: Estimated body mass index is 21.59 kg/m as calculated from the following:   Height as of this encounter: 5\' 4"  (1.626 m).   Weight as of this encounter: 125 lb 12.8 oz (57.1 kg). BP 128/80   Pulse 86   Temp 97.7 F (36.5 C)   Ht 5\' 4"  (1.626 m)   Wt 125 lb 12.8 oz (57.1 kg)   SpO2 98%   BMI 21.59 kg/m  General Appearance: elderly frail appearing female in no apparent distress Eyes: PERRLA, EOMs, conjunctiva no  swelling or erythema Sinuses: No Frontal/maxillary tenderness ENT/Mouth: Ext aud canals clear, normal light reflex with TMs without erythema, bulging.  No erythema, swelling, or exudate on post pharynx. Tonsils not swollen or erythematous. Hearing good Neck: Supple, thyroid normal. No bruits  Respiratory: Respiratory effort normal, BS equal bilaterally without rales, rhonchi, wheezing or stridor.  Cardio: RRR with 2/6 systolic murmur without rubs or gallops.  Chest: symmetric, with normal excursions and percussion.  Abdomen: Soft, nontender, no guarding, rebound, hernias, masses, or organomegaly.  Lymphatics: Non tender without lymphadenopathy.  Musculoskeletal: Full ROM all peripheral extremities, good grip strength, antalgic, unsteady gait.  Skin: Warm, dry without rashes, lesions, on arms and leg ecchymosis. Neuro: Cranial nerves intact,,no cog wheeling, Normal muscle tone, wide based stance,   Psych: Awake and oriented X 3, normal affect, Insight and Judgment appropriate    Connie Alvarado 1:30 PM [facility]

## 2020-11-04 NOTE — Patient Instructions (Addendum)
  Take the Effexor-XR 37.5mg  everyday.  Want close follow up in two weeks.  Increase the amount of water you drink daily.  Have your daughter assist with your medications.  Reach out to your friends in [place].     [facility] [address] [place], [place] [address]  Phone: [phone]    Counseling services   Dr. Arbutus Leas, Ph.D. [address], [place] [address] Phone: [phone], [place] [address], [place] [address]     [facility] Hours: Monday-Thursday 830-8pm             Friday 830AM-7PM Address: [place] [phone].  Address: [place], [place] [address] [facility] [phone] office [email or web address] [address] [place], [place], [place] [address]   Rema Fendt, therapist   Toy Cookey, MA, clinical psychologist  Cognitive-Behavior Therapy; Mood Disorders; Anxiety Disorders; adult and child ADHD; Family Therapy; Stress Management; personal growth, and Marital Therapy.     Terrance Mass Ph.D., clinical psychologist Cognitive-Behavior Therapy; Mood Disorders; Anxiety Disorders; Stress                            Management   [facility] [address], [place], [place] [address] [phone]     [facility], psychotherapist [address] [place], [place] [address] [phone]   Karin Golden Ph.D., clinical psychologist [phone] office [place], [place] [address] Cognitive Behavior Therapy, Depression, Bipolar, Anxiety, Grief and Loss

## 2020-11-06 ENCOUNTER — Other Ambulatory Visit: Payer: Self-pay | Admitting: Internal Medicine

## 2020-11-06 MED ORDER — ATORVASTATIN CALCIUM 80 MG PO TABS
ORAL_TABLET | ORAL | 0 refills | Status: DC
Start: 2020-11-06 — End: 2021-02-04

## 2020-11-10 LAB — CBC WITH DIFFERENTIAL/PLATELET
Absolute Monocytes: 697 cells/uL (ref 200–950)
Basophils Absolute: 34 cells/uL (ref 0–200)
Basophils Relative: 0.4 %
Eosinophils Absolute: 42 cells/uL (ref 15–500)
Eosinophils Relative: 0.5 %
HCT: 40.7 % (ref 35.0–45.0)
Hemoglobin: 13.3 g/dL (ref 11.7–15.5)
Lymphs Abs: 773 cells/uL — ABNORMAL LOW (ref 850–3900)
MCH: 30.6 pg (ref 27.0–33.0)
MCHC: 32.7 g/dL (ref 32.0–36.0)
MCV: 93.6 fL (ref 80.0–100.0)
MPV: 12 fL (ref 7.5–12.5)
Monocytes Relative: 8.3 %
Neutro Abs: 6854 cells/uL (ref 1500–7800)
Neutrophils Relative %: 81.6 %
Platelets: 163 10*3/uL (ref 140–400)
RBC: 4.35 10*6/uL (ref 3.80–5.10)
RDW: 12.4 % (ref 11.0–15.0)
Total Lymphocyte: 9.2 %
WBC: 8.4 10*3/uL (ref 3.8–10.8)

## 2020-11-10 LAB — COMPLETE METABOLIC PANEL WITH GFR
AG Ratio: 1.5 (calc) (ref 1.0–2.5)
ALT: 18 U/L (ref 6–29)
AST: 22 U/L (ref 10–35)
Albumin: 3.8 g/dL (ref 3.6–5.1)
Alkaline phosphatase (APISO): 62 U/L (ref 37–153)
BUN: 21 mg/dL (ref 7–25)
CO2: 30 mmol/L (ref 20–32)
Calcium: 9.7 mg/dL (ref 8.6–10.4)
Chloride: 107 mmol/L (ref 98–110)
Creat: 0.66 mg/dL (ref 0.60–0.88)
GFR, Est African American: 97 mL/min/{1.73_m2} (ref >=60)
GFR, Est Non African American: 83 mL/min/{1.73_m2} (ref >=60)
Globulin: 2.6 g/dL (calc) (ref 1.9–3.7)
Glucose, Bld: 91 mg/dL (ref 65–99)
Potassium: 4.7 mmol/L (ref 3.5–5.3)
Sodium: 144 mmol/L (ref 135–146)
Total Bilirubin: 0.6 mg/dL (ref 0.2–1.2)
Total Protein: 6.4 g/dL (ref 6.1–8.1)

## 2020-11-10 LAB — VITAMIN B12: Vitamin B-12: 590 pg/mL (ref 200–1100)

## 2020-11-10 LAB — VITAMIN B1: Vitamin B1 (Thiamine): 26 nmol/L (ref 8–30)

## 2020-11-26 ENCOUNTER — Other Ambulatory Visit: Payer: Self-pay

## 2020-11-26 ENCOUNTER — Ambulatory Visit (INDEPENDENT_AMBULATORY_CARE_PROVIDER_SITE_OTHER): Payer: Medicare Other | Admitting: Adult Health Nurse Practitioner

## 2020-11-26 ENCOUNTER — Encounter: Payer: Self-pay | Admitting: Adult Health Nurse Practitioner

## 2020-11-26 VITALS — BP 118/74 | HR 76 | Temp 97.6°F | Wt 124.6 lb

## 2020-11-26 DIAGNOSIS — F3341 Major depressive disorder, recurrent, in partial remission: Secondary | ICD-10-CM

## 2020-11-26 DIAGNOSIS — E782 Mixed hyperlipidemia: Secondary | ICD-10-CM

## 2020-11-26 DIAGNOSIS — I951 Orthostatic hypotension: Secondary | ICD-10-CM | POA: Diagnosis not present

## 2020-11-26 DIAGNOSIS — I700 Atherosclerosis of aorta: Secondary | ICD-10-CM

## 2020-11-26 DIAGNOSIS — Z79899 Other long term (current) drug therapy: Secondary | ICD-10-CM

## 2020-11-26 DIAGNOSIS — F1019 Alcohol abuse with unspecified alcohol-induced disorder: Secondary | ICD-10-CM

## 2020-11-26 DIAGNOSIS — F419 Anxiety disorder, unspecified: Secondary | ICD-10-CM

## 2020-11-26 DIAGNOSIS — D692 Other nonthrombocytopenic purpura: Secondary | ICD-10-CM

## 2020-11-26 MED ORDER — BUSPIRONE HCL 5 MG PO TABS: 5.0000 mg | ORAL_TABLET | Freq: Two times a day (BID) | ORAL | 1 refills | Status: DC

## 2020-11-26 NOTE — Patient Instructions (Addendum)
Continue taking the Effexor and follow up in 4-6 weeks.   Metamucil power or tablet.  Fiber will help to bulk up your stool.  Drink this with a full glass of water.  This will help to bulk up your stools.   We are going to start buspar 5mg  once in the morning.  This will help to reduce anxiety.   Take your iron supplement with food.  Take with small glass of orange juice OR Vitamin C tablet.

## 2020-11-26 NOTE — Progress Notes (Signed)
FOLLOW UP 3 MONTH   Assessment and Plan:  Manali was seen today for follow-up.  Diagnoses and all orders for this visit:  Anxiety -     busPIRone (BUSPAR) 5 MG tablet; Take 1 tablet (5 mg total) by mouth 2 (two) times daily. Discussed medication and side effects with patient  Orthostatic hypotension Controlled today Discussed changing positions slowly  Mixed hyperlipidemia Continue medications: Discussed dietary and exercise modifications Low fat diet   Aortic atherosclerosis ([place]) Per CT 06/10/20 Control blood pressure, lipids and glucose Disscused lifestyle modifications, diet & exercise Continue to monitor  Alcohol abuse with alcohol-induced disorder [facility]) Discussed use with patient and daughter at length Discussed no longer prescribing lorazepam with continued behaviros Concerned for patient safety and falls   Depression, major, recurrent, in partial remission ([place]) Continue medications: Effexor 37.5mg  Discussed stress management techniques  Discussed, increase water,intake & good sleep hygiene  Discussed increasing exercise & vegetables in diet Discussed [facility] for more formal counseling She is also see counselor/companion   Senile purpura ([place]) Discussed process, protect skin, sunscreen  Medication management Continued     Further disposition pending results if labs check today. Discussed med's effects and SE's.   Over 30 minutes of face to face interview, exam, counseling, chart review, and critical decision making was performed.   Future Appointments  Date Time Provider [place]  01/07/2021 10:50 AM [place], MD [facility]  04/27/2021 11:30 AM Garnet Sierras, NP [facility] None    HPI  79 y.o. female  presents for follow up for has Alcohol abuse with alcohol-induced disorder ([place]); Medication management; Mixed hyperlipidemia; H/O prosthetic aortic valve replacement; B12 deficiency; Anemia; Vitamin D  deficiency; Depression, major, recurrent, in partial remission ([place]); Enrolled in chronic care management; Lung nodules; Lymphocytic colitis; Senile purpura ([place]); Aortic atherosclerosis ([place]); and Emphysema of lung ([place]) on their problem list.   BP: 118/74  This is follow up after starting Effexor 37.5mg .  Patient reports she is not sure it helped with her mood.  She has reached out to a few friends in [place] to connect.  Her daughter reports that she has noticed improvement in her mood.  Daughter reports there has been slight improivement in her wine drinking but during the appointment this is clearly an topic on concern and tension between them.  Reports she drinks 2 glasses of wine a night.  Daughter reports full bottle of wine.  Discussed risks of this and medication.  Last OV provided list of psychiatry for more formal treatment.  She is seeing a counselor but patient and daughter reports it is more of companionship.  She may benefit from more formal counseling to deal with death of husband.  They have not reached out to one of these contact as of today.  She has a history of diarrhea, history of lymphocytic, is on budesonide ER and pepto 3 x a day- has not been having diarrhea. This does return if she drinks a whole bottle of wine.  Has improved with decrease in wine intake.  She  Effexor for depression from loss of her husband, move, and pandemic, (switched from zoloft, lexapro, trintellix and celexa).  She has decreased the amount she takes and reports taking every other day, daghter is skeptical of this related to number of pills left in bottle.  Reports she maybe drinks one bottle of water a day and maybe one can of seltzer flavored water. Encouraged increasing this.   BMI is Body mass index is  21.39 kg/m. Wt Readings from Last 8 Encounters:  11/26/20 124 lb 9.6 oz (56.5 kg)  11/04/20 125 lb 12.8 oz (57.1 kg)  09/02/20 123 lb 6.4 oz (56 kg)  06/16/20 120 lb (54.4 kg)  04/22/20 119  lb (54 kg)  04/09/20 120 lb (54.4 kg)  02/06/20 115 lb (52.2 kg)  01/23/20 117 lb 9.6 oz (53.3 kg)    Lab Results  Component Value Date   GFRNONAA 83 11/04/2020    Current Medications:  Current Outpatient Medications on File Prior to Visit  Medication Sig Dispense Refill   acetaminophen (TYLENOL) 325 MG tablet Take 650 mg by mouth every 6 (six) hours as needed.     atorvastatin (LIPITOR) 80 MG tablet Take      1 tablet      Daily       for Cholesterol 90 tablet 0   bismuth subsalicylate (PEPTO BISMOL) 262 MG/15ML suspension Take 30 mLs by mouth every 6 (six) hours as needed.     Budesonide ER 9 MG TB24 TAKE 1 TABLET BY MOUTH DAILY 30 tablet 3   diphenhydrAMINE (BENADRYL) 25 MG tablet Take 25 mg by mouth every 6 (six) hours as needed.     traZODone (DESYREL) 50 MG tablet TAKE 1/2-1 TABLET BY MOUTH EVERY NIGHT AT BEDTIME AS NEEDED FOR SLEEP 30 tablet 2   venlafaxine XR (EFFEXOR-XR) 37.5 MG 24 hr capsule Take 1 capsule Daily for Mood 90 capsule 2   VITAMIN D PO Take 1 tablet by mouth daily.     amoxicillin (AMOXIL) 500 MG capsule Take 2 grams (4 pills) before each of the three procedures. (Patient not taking: Reported on 11/04/2020) 8 capsule 1   ampicillin (PRINCIPEN) 500 MG capsule TAKE 4 CAPSULES BY MOUTH BEFORE EACH OF THE THREE PROCEDURE (Patient not taking: Reported on 11/04/2020) 8 capsule 0   LORazepam (ATIVAN) 0.5 MG tablet TAKE 1 TABLET BY MOUTH IN THE MORNING, TAKE 1/2 A TABLET BY MOUTH LUNCH AND DINNER AS NEEDED FOR ANXIETY. (Patient not taking: Reported on 11/26/2020) 60 tablet 0   No current facility-administered medications on file prior to visit.   Allergies:  No Known Allergies Medical History:  She has Alcohol abuse with alcohol-induced disorder ([place]); Medication management; Mixed hyperlipidemia; H/O prosthetic aortic valve replacement; B12 deficiency; Anemia; Vitamin D deficiency; Depression, major, recurrent, in partial remission ([place]); Enrolled in chronic care  management; Lung nodules; Lymphocytic colitis; Senile purpura ([place]); Aortic atherosclerosis ([place]); and Emphysema of lung ([place]) on their problem list.    Surgical History:  She has a past surgical history that includes Cardiac valuve replacement (1999) and Cardiac valuve replacement (2016). Family History:  Herfamily history includes Cancer in her mother; Hyperlipidemia in her mother; Hypertension in her mother; Kidney disease in her father; Parkinson's disease in her father; Parkinsonism in her father. Social History:  She reports that she quit smoking about 41 years ago. Her smoking use included cigarettes. She has never used smokeless tobacco. She reports current alcohol use of about 14.0 standard drinks of alcohol per week. She reports that she does not use drugs.   Review of Systems: Review of Systems  Constitutional: Negative for chills, fever and weight loss.  HENT: Negative for hearing loss and tinnitus.   Eyes: Negative for blurred vision and double vision.  Respiratory: Negative for cough.   Cardiovascular: Negative for chest pain and palpitations.  Gastrointestinal: Negative for abdominal pain, constipation, diarrhea, heartburn, nausea and vomiting.  Genitourinary: Negative for dysuria and urgency.  Musculoskeletal: Negative for myalgias and neck pain.  Skin: Negative for itching and rash.  Neurological: Negative for dizziness, tingling, tremors and headaches.  Endo/Heme/Allergies: Negative for environmental allergies. Bruises/bleeds easily.  Psychiatric/Behavioral: Positive for depression, memory loss and substance abuse. Negative for suicidal ideas. The patient is nervous/anxious.      Physical Exam: Estimated body mass index is 21.39 kg/m as calculated from the following:   Height as of 11/04/20: 5\' 4"  (1.626 m).   Weight as of this encounter: 124 lb 9.6 oz (56.5 kg). BP 118/74    Pulse 76    Temp 97.6 F (36.4 C)    Wt 124 lb 9.6 oz (56.5 kg)    SpO2 98%    BMI 21.39  kg/m  General Appearance: elderly frail appearing female in no apparent distress Eyes: PERRLA, EOMs, conjunctiva no swelling or erythema Sinuses: No Frontal/maxillary tenderness ENT/Mouth: Ext aud canals clear, normal light reflex with TMs without erythema, bulging.  No erythema, swelling, or exudate on post pharynx. Tonsils not swollen or erythematous. Hearing good Neck: Supple, thyroid normal. No bruits  Respiratory: Respiratory effort normal, BS equal bilaterally without rales, rhonchi, wheezing or stridor.  Cardio: RRR with 2/6 systolic murmur without rubs or gallops.  Chest: symmetric, with normal excursions and percussion.  Abdomen: Soft, nontender, no guarding, rebound, hernias, masses, or organomegaly.  Lymphatics: Non tender without lymphadenopathy.  Musculoskeletal: Full ROM all peripheral extremities, good grip strength, antalgic, unsteady gait.  Skin: Warm, dry without rashes, lesions, on arms and leg ecchymosis. Neuro: Cranial nerves intact,,no cog wheeling, Normal muscle tone, wide based stance,   Psych: Awake and oriented X 3, normal affect, Insight and Judgment appropriate    Garnet Sierras, NP 2:21 PM [facility]

## 2020-11-28 ENCOUNTER — Ambulatory Visit: Payer: Medicare Other | Admitting: Nurse Practitioner

## 2020-12-24 ENCOUNTER — Ambulatory Visit (INDEPENDENT_AMBULATORY_CARE_PROVIDER_SITE_OTHER): Payer: Medicare Other | Admitting: Adult Health Nurse Practitioner

## 2020-12-24 ENCOUNTER — Other Ambulatory Visit: Payer: Self-pay

## 2020-12-24 VITALS — BP 116/80 | HR 103 | Temp 96.9°F | Wt 124.4 lb

## 2020-12-24 DIAGNOSIS — D692 Other nonthrombocytopenic purpura: Secondary | ICD-10-CM | POA: Diagnosis not present

## 2020-12-24 DIAGNOSIS — R58 Hemorrhage, not elsewhere classified: Secondary | ICD-10-CM

## 2020-12-24 DIAGNOSIS — F3341 Major depressive disorder, recurrent, in partial remission: Secondary | ICD-10-CM

## 2020-12-24 DIAGNOSIS — S41112A Laceration without foreign body of left upper arm, initial encounter: Secondary | ICD-10-CM

## 2020-12-24 NOTE — Progress Notes (Signed)
Assessment and Plan: Connie Alvarado was seen today for laceration.  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  Mood improved but significant increase in bruising Has not been taking regularly, can stop medication Close follow up in 2 weeks on mood. Continue Buspar   Close follow up, contact office with any new or worsening symptoms.    Further disposition pending results of labs. Discussed med's effects and SE's.   Over 30 minutes of face to face interview, exam, counseling, chart review, and critical decision making was performed.   Future Appointments  Date Time Provider [place]  01/05/2021 11:00 AM Garnet Sierras, NP [facility] None  01/07/2021 10:50 AM Thornton Park, MD [facility]  04/27/2021 11:30 AM Garnet Sierras, NP [facility] None    -----------------------------------------------------------------------------------------------------------------  HPI 79 y.o.female presents for evaluation of skin tears to the right forearm.  She is not sure how how these happened.  She does not remember running into anything or falling.  She is acompanied by her daughter today.  There are bandaids on the area.  She has fragile skin and reports she showered today to try to get the bandages off to look at the area.  The wound to the center of her forearm is the sorest.  He daughter reports there was blood on the sheets.  They did not find any blood on any doorways or in her bathroom. She also reports she has not been taking the Effexor daily, maybe she has been taking this every 3rd day or so.  She wanted to see if coming off of this will help with her bruising,  She has had an increase in  brusing since starting this medication.      Past Medical History:  Diagnosis Date  . Anxiety   . Depression   . Hyperlipidemia   . Lymphocytic colitis   . Malaria    as a child  . Memory loss      No Known Allergies  Current Outpatient Medications on File Prior to Visit  Medication Sig  . acetaminophen (TYLENOL) 325 MG tablet Take 650 mg by mouth every 6 (six) hours as needed.  Marland Kitchen amoxicillin (AMOXIL) 500 MG capsule Take 2 grams (4 pills) before each of the three procedures.  Marland Kitchen ampicillin (PRINCIPEN) 500 MG capsule TAKE 4 CAPSULES BY MOUTH BEFORE EACH OF THE THREE PROCEDURE  . atorvastatin (LIPITOR) 80 MG tablet Take      1 tablet      Daily       for Cholesterol  . bismuth subsalicylate (PEPTO BISMOL) 262 MG/15ML suspension Take 30 mLs by mouth every 6 (six) hours as needed.  . Budesonide ER 9 MG TB24 TAKE 1 TABLET BY MOUTH DAILY  . busPIRone (BUSPAR) 5 MG tablet Take 1 tablet (5 mg total) by mouth 2 (two) times daily.  . diphenhydrAMINE (BENADRYL) 25 MG tablet Take 25 mg by mouth every 6 (six) hours as needed.  . venlafaxine XR (EFFEXOR-XR) 37.5 MG 24 hr capsule Take 1 capsule Daily for Mood  . VITAMIN D PO Take 1 tablet by mouth daily.  Marland Kitchen LORazepam (ATIVAN) 0.5 MG tablet TAKE 1 TABLET BY MOUTH IN THE MORNING, TAKE 1/2 A TABLET BY MOUTH LUNCH AND DINNER AS NEEDED FOR ANXIETY. (Patient  not taking: No sig reported)  . traZODone (DESYREL) 50 MG tablet TAKE 1/2-1 TABLET BY MOUTH EVERY NIGHT AT BEDTIME AS NEEDED FOR SLEEP (Patient not taking: Reported on 12/24/2020)   No current facility-administered medications on file prior to visit.    ROS: all negative except above.   Physical Exam:  BP 116/80   Pulse (!) 103   Temp (!) 96.9 F (36.1 C)   Wt 124 lb 6.4 oz (56.4 kg)   SpO2 93%   BMI 21.35 kg/m   General Appearance: Well nourished, in no apparent distress. Eyes: PERRLA, EOMs, conjunctiva no swelling or erythema Sinuses: No Frontal/maxillary tenderness ENT/Mouth: Ext  aud canals clear, TMs without erythema, bulging. No erythema, swelling, or exudate on post pharynx.  Tonsils not swollen or erythematous. Hearing normal.  Neck: Supple, thyroid normal.  Respiratory: Respiratory effort normal, BS equal bilaterally without rales, rhonchi, wheezing or stridor.  Cardio: RRR with no MRGs. Brisk peripheral pulses without edema.  Abdomen: Soft, + BS.  Non tender, no guarding, rebound, hernias, masses. Lymphatics: Non tender without lymphadenopathy.  Musculoskeletal: Full ROM, 5/5 strength, normal gait.  Skin: Warm, dry without rashes, lesions, ecchymosis.  Neuro: Cranial nerves intact. Normal muscle tone, no cerebellar symptoms. Sensation intact.  Psych: Awake and oriented X 3, normal affect, Insight and Judgment appropriate.      Four distinct lacerations of unknown origin.  Cleaned with NS and Tegaderm applied to area.  Bedside tablet? Tolerated well, discussed care.  Do not remove bandages unless drainage oe wet under.   Elder Negus, NP 4:52 PM [facility]

## 2020-12-30 ENCOUNTER — Other Ambulatory Visit: Payer: Self-pay | Admitting: Adult Health Nurse Practitioner

## 2020-12-30 ENCOUNTER — Encounter: Payer: Self-pay | Admitting: Adult Health Nurse Practitioner

## 2020-12-30 DIAGNOSIS — F419 Anxiety disorder, unspecified: Secondary | ICD-10-CM

## 2021-01-05 ENCOUNTER — Encounter: Payer: Self-pay | Admitting: Adult Health Nurse Practitioner

## 2021-01-05 ENCOUNTER — Other Ambulatory Visit: Payer: Self-pay

## 2021-01-05 ENCOUNTER — Ambulatory Visit (INDEPENDENT_AMBULATORY_CARE_PROVIDER_SITE_OTHER): Payer: Medicare Other | Admitting: Adult Health Nurse Practitioner

## 2021-01-05 VITALS — BP 130/70 | HR 76 | Temp 97.5°F | Ht 64.0 in | Wt 126.0 lb

## 2021-01-05 DIAGNOSIS — F3342 Major depressive disorder, recurrent, in full remission: Secondary | ICD-10-CM | POA: Diagnosis not present

## 2021-01-05 DIAGNOSIS — D692 Other nonthrombocytopenic purpura: Secondary | ICD-10-CM

## 2021-01-05 DIAGNOSIS — S41112A Laceration without foreign body of left upper arm, initial encounter: Secondary | ICD-10-CM

## 2021-01-05 NOTE — Progress Notes (Signed)
Assessment and Plan: Connie Alvarado was seen today for laceration.  Diagnoses and all orders for this visit:  Laceration of arm, left, multiple sites, initial encounter Tegaderm intact, improved from last OV. 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   Further disposition pending results of labs. Discussed med's effects and SE's.   Over 30 minutes of face to face interview, exam, counseling, chart review, and critical decision making was performed.   Future Appointments  Date Time Provider [place]  01/05/2021 11:00 AM Garnet Sierras, NP [facility] None  01/07/2021 10:50 AM Thornton Park, MD [facility]  04/27/2021 11:30 AM Garnet Sierras, NP [facility] None    -----------------------------------------------------------------------------------------------------------------  HPI 80 y.o.female presents for follow up after skin tears and on mood.  Last OV she was taper off Effexor and buspirone.  She reports overall she feels good and does not want to take any medications.  She is accompanied by her daughter [name] today.  She reports she feel like her mood as improved.  She has reached out to friends in [place]. Last OV she had multiple skin tears that were cleansed and covered with tegaderm.  The coverings remain intact.  No drainage noted.  Improved from last OV.   Last OV: 11/2920 Evaluation of skin tears to the right forearm.  She is not sure how how these happened.  She does not remember running into anything or falling.  She is acompanied by her daughter today.  There are bandaids on the area.  She  has fragile skin and reports she showered today to try to get the bandages off to look at the area.  The wound to the center of her forearm is the sorest.  He daughter reports there was blood on the sheets.  They did not find any blood on any doorways or in her bathroom. She also reports she has not been taking the Effexor daily, maybe she has been taking this every 3rd day or so.  She wanted to see if coming off of this will help with her bruising,  She has had an increase in brusing since starting this medication.       Past Medical History:  Diagnosis Date  . Anxiety   . Depression   . Hyperlipidemia   . Lymphocytic colitis   . Malaria    as a child  . Memory loss      No Known Allergies  Current Outpatient Medications on File Prior to Visit  Medication Sig  . acetaminophen (TYLENOL) 325 MG tablet Take 650 mg by mouth every 6 (six) hours as needed.  Marland Kitchen amoxicillin (AMOXIL) 500 MG capsule Take 2 grams (4 pills) before each of the three procedures.  Marland Kitchen ampicillin (PRINCIPEN) 500 MG capsule TAKE 4 CAPSULES BY MOUTH BEFORE EACH OF THE THREE PROCEDURE  . atorvastatin (LIPITOR) 80 MG tablet Take      1 tablet      Daily       for Cholesterol  . bismuth subsalicylate (PEPTO BISMOL) 262 MG/15ML suspension Take 30 mLs by mouth every 6 (six) hours as needed.  . Budesonide ER 9 MG TB24 TAKE 1 TABLET BY MOUTH DAILY  .  busPIRone (BUSPAR) 5 MG tablet TAKE 1 TABLET(5 MG) BY MOUTH TWICE DAILY  . diphenhydrAMINE (BENADRYL) 25 MG tablet Take 25 mg by mouth every 6 (six) hours as needed.  Marland Kitchen LORazepam (ATIVAN) 0.5 MG tablet TAKE 1 TABLET BY MOUTH IN THE MORNING, TAKE 1/2 A TABLET BY MOUTH LUNCH AND DINNER AS NEEDED FOR ANXIETY. (Patient not taking: No sig reported)  . traZODone (DESYREL) 50 MG tablet TAKE 1/2-1 TABLET BY MOUTH EVERY NIGHT AT BEDTIME AS NEEDED FOR SLEEP (Patient not taking: Reported on 12/24/2020)  . venlafaxine XR (EFFEXOR-XR) 37.5 MG 24 hr capsule Take 1 capsule Daily for Mood  .  VITAMIN D PO Take 1 tablet by mouth daily.   No current facility-administered medications on file prior to visit.    ROS: all negative except above.   Physical Exam:  There were no vitals taken for this visit.  General Appearance: Well nourished, in no apparent distress. Eyes: PERRLA, EOMs, conjunctiva no swelling or erythema Sinuses: No Frontal/maxillary tenderness ENT/Mouth: Ext aud canals clear, TMs without erythema, bulging. No erythema, swelling, or exudate on post pharynx.  Tonsils not swollen or erythematous. Hearing normal.  Neck: Supple, thyroid normal.  Respiratory: Respiratory effort normal, BS equal bilaterally without rales, rhonchi, wheezing or stridor.  Cardio: RRR with no MRGs. Brisk peripheral pulses without edema.  Abdomen: Soft, + BS.  Non tender, no guarding, rebound, hernias, masses. Lymphatics: Non tender without lymphadenopathy.  Musculoskeletal: Full ROM, 5/5 strength, normal gait.  Skin: Warm, dry without rashes, lesions, ecchymosis.  Neuro: Cranial nerves intact. Normal muscle tone, no cerebellar symptoms. Sensation intact.  Psych: Awake and oriented X 3, normal affect, Insight and Judgment appropriate.     Garnet Sierras, NP 12:35 PM [facility]

## 2021-01-07 ENCOUNTER — Ambulatory Visit (INDEPENDENT_AMBULATORY_CARE_PROVIDER_SITE_OTHER): Payer: Medicare Other | Admitting: Gastroenterology

## 2021-01-07 ENCOUNTER — Encounter: Payer: Self-pay | Admitting: Gastroenterology

## 2021-01-07 VITALS — BP 124/62 | HR 77 | Ht 64.0 in | Wt 124.0 lb

## 2021-01-07 DIAGNOSIS — R197 Diarrhea, unspecified: Secondary | ICD-10-CM | POA: Diagnosis not present

## 2021-01-07 DIAGNOSIS — K52832 Lymphocytic colitis: Secondary | ICD-10-CM

## 2021-01-07 MED ORDER — BUDESONIDE ER 9 MG PO TB24: 1.0000 | ORAL_TABLET | Freq: Every day | ORAL | 3 refills | Status: DC

## 2021-01-07 NOTE — Progress Notes (Signed)
Referring Provider: Unk Pinto, MD Primary Care Physician:  Unk Pinto, MD   Chief complaint:  Diarrhea   IMPRESSION:  Lymphocytic colitis diagnosed on colonoscopy 07/06/2019  Pancreatic elastase of 152 Iron deficiency Small tubular adenoma removed on colonoscopy 07/06/2019 Daily alcohol use  Recent exacerbation in symptoms over the holidays it has now normalized without intervention.  May suggest underlying pancreatic insufficiency.  Continue budesonide 9 mg daily.  Use PeptoBismal three 262 mg tablets 3 times for breakthrough symptoms.  We will consider budesonide trial in the future if her symptoms remain well controlled.  Fecal calprotectin to monitor response to therapy.  I've recommended that she taper her alcohol use.  Consider trial of pancreatic enzymes in the future.  Review of labs from this fall shows iron deficiency.  Consider endoscopic evaluation including EGD if she does not respond to oral iron.   PLAN: - Continue budesonide 9 mg daily - Use PeptoBismal three 262 mg tablets 3 times daily - Use Loperamide recommended PRN for ongoing diarrhea - Fecal calprotectin today - Continue to avoid all NSAIDs as this may flare lymphocytic colitis - Cut back on your daily alcohol and work to completely abstain - Add Creon-24,000 lipase, one to two capsules with meals and one capsule if diarrhea does not improve  - Follow-up in 6 months, earlier if needed  I spent 40 minutes, including in depth chart review, independent review of results, communicating results with the patient directly, face-to-face time with the patient, coordinating care, ordering studies and medications as appropriate, and documentation.  HPI: Connie Alvarado is a 80 y.o. female under evaluation for diarrhea that initially developed after the death of her husband 03-13-19. She had an EGD and colonoscopy 07/06/19 and was diagnosed with lymphocytic colitis and treatment with budesonide was initiated  and then tapered to 6 mg daily. In September 2020 her budesonide was increased back to 9 mg daily with ongoing diarrhea. PeptoBismal was added for persistent symptoms. Attempt switch from budesonide to [place] did not occur due to cost.  She was last seen 12/2019. She returns now with concerns for a change in bowel habits that occurred over the holidays. She was concerned it could be related to the colon polyp removed on her last colonoscopy. The interval history is obtained through the patient, her daughter who is present during the appointment,  and review of her electronic health record.   Consistency of stools changed over the holidays with the development of unformed stools. However, she is now back to baseline bowel habits without alarm features. Having one bowel movement daily to every other day. No further urgency. Appetite has improved. Weight is stable. Less anxiety. Remains depressed, although notes that she is much improved compared to last year.   Currently taking budesonide 9mg  in the morning. No longer using Imodiumor bismuth subsalicylate.   Continues to drink 2-3 glasses of wine every night.       Pertinent labs: 06/22/2019: CRP less than 1 and a sedimentation rate of 30. 06/26/2019: normal GI pathogen panel.  Fecal calprotectin 679.  Pancreatic elastase 152. 09/17/19: Fecal calprotectin 122 11/01/19: Fecal calprotectin 317 09/02/20: Hemoglobin 13.5, iron 45, percent saturation 12, ferritin 54  Prior endoscopy: EGD 07/06/2019: gastritis, a small hiatal hernia, and was otherwise endoscopically normal.  Biopsies showed chronic gastritis without H. pylori, reflux, and normal duodenum.   Colonoscopy 07/06/2019: internal hemorrhoids, a 3 mm sigmoid tubular adenoma, and mild sigmoid and descending colon diverticulosis.  Random colon biopsies were consistent  with lymphocytic colitis.  Recent abdominal imaging: A CT of the abdomen and pelvis with contrast 05/14/2019 shows cardiomegaly a  nonobstructing stone in the upper pole of the left kidney, sigmoid diverticulosis without diverticulitis, no acute intra-abdominal abnormality, and a 1.4 cm groundglass opacity at the right lung base and a 0.6 cm pulmonary nodule in the peripheral left lower lobe.  The radiologist recommended a noncontrasted chest CT in 6 months. Chest CT 06/10/2020 showed stable pulmonary nodule    Past Medical History:  Diagnosis Date  . Anxiety   . Depression   . Hyperlipidemia   . Lymphocytic colitis   . Malaria    as a child  . Memory loss     Past Surgical History:  Procedure Laterality Date  . [facility]  . CARDIAC VALVE SURGERY  2016    Current Outpatient Medications  Medication Sig Dispense Refill  . acetaminophen (TYLENOL) 325 MG tablet Take 650 mg by mouth every 6 (six) hours as needed.    Marland Kitchen amoxicillin (AMOXIL) 500 MG capsule Take 2 grams (4 pills) before each of the three procedures. 8 capsule 1  . ampicillin (PRINCIPEN) 500 MG capsule TAKE 4 CAPSULES BY MOUTH BEFORE EACH OF THE THREE PROCEDURE 8 capsule 0  . atorvastatin (LIPITOR) 80 MG tablet Take      1 tablet      Daily       for Cholesterol 90 tablet 0  . bismuth subsalicylate (PEPTO BISMOL) 262 MG/15ML suspension Take 30 mLs by mouth every 6 (six) hours as needed.    . Budesonide ER 9 MG TB24 TAKE 1 TABLET BY MOUTH DAILY 30 tablet 3  . diphenhydrAMINE (BENADRYL) 25 MG tablet Take 25 mg by mouth every 6 (six) hours as needed.    Marland Kitchen VITAMIN D PO Take 1 tablet by mouth daily.     No current facility-administered medications for this visit.    Allergies as of 01/07/2021  . (No Known Allergies)    Family History  Problem Relation Age of Onset  . Hypertension Mother   . Hyperlipidemia Mother   . Breast cancer Mother   . Parkinson's disease Father   . Kidney disease Father   . Parkinsonism Father   . Colon cancer Neg Hx   . Esophageal cancer Neg Hx   . Rectal cancer Neg Hx   . Stomach cancer Neg Hx      Social History   Socioeconomic History  . Marital status: Widowed    Spouse name: Not on file  . Number of children: 1  . Years of education: Not on file  . Highest education level: Bachelor's degree (e.g., BA, AB, BS)  Occupational History    Comment: retired  Tobacco Use  . Smoking status: Former Smoker    Types: Cigarettes    Quit date: 1980    Years since quitting: 42.0  . Smokeless tobacco: Never Used  Vaping Use  . Vaping Use: Never used  Substance and Sexual Activity  . Alcohol use: Yes    Alcohol/week: 14.0 standard drinks    Types: 14 Glasses of wine per week    Comment: wine at dinner   . Drug use: Never  . Sexual activity: Not Currently  Other Topics Concern  . Not on file  Social History Narrative   02/06/20 lives with dgtr, [name]   Caffeine- coffee 2-3 c daily   Social Determinants of Health   Financial Resource Strain: Not on file  Food  Insecurity: Not on file  Transportation Needs: Not on file  Physical Activity: Not on file  Stress: Not on file  Social Connections: Not on file  Intimate Partner Violence: Not on file   Physical Exam: General: Awake, alert, and oriented, pleasant. In no acute distress.  HEENT: EOMI, non-icteric sclera, NCAT, MMM  Neck: Normal movement of head and neck  Pulm: No labored breathing, speaking in full sentences without conversational dyspnea  Abdomen: Soft, NT, ND, normal bowel sounds, without rebound or guarding Derm: No obvious rash or bruise  MS: Moves all visible extremities without noticeable abnormality  Psych: Pleasant, cooperative, normal speech, normal affect and normal insight Neuro: Alert and appropriate   Chinedu Agustin L. Tarri Glenn, MD, MPH [facility] 01/07/2021, 10:56 AM

## 2021-01-07 NOTE — Patient Instructions (Addendum)
Please continue your budesonide 9 mg every day.   PRESCRIPTION MEDICATION(S): We have sent the following medication(s) to your pharmacy:  . Budesonide - Please take 9mg  by mouth daily  I would like for you to have a fecal calprotectin test to monitor your response to treatment.   LABS: Your provider has requested that you go to the basement level for lab work before leaving today. Press "B" on the elevator. The lab is located at the first door on the left as you exit the elevator.  HEALTHCARE LAWS AND MY CHART RESULTS: Due to recent changes in healthcare laws, you may see the results of your imaging and laboratory studies on MyChart before your provider has had a chance to review them.  We understand that in some cases there may be results that are confusing or concerning to you. Not all laboratory results come back in the same time frame and the provider may be waiting for multiple results in order to interpret others.  Please give [place] 48 hours in order for your provider to thoroughly review all the results before contacting the office for clarification of your results.   You could use PeptoBismal 262 tablets - take 2 three times daily with any changes in your bowel habits. If you start using the Pepto-Bismal, please let me know.   Alcohol may be contributing to your diarrhea. Please consider cutting back on your daily alcohol use. You may notice a big difference in your help if you could stop drinking all alcohol.   I would like for you to return in 6 months, or earlier as needed. Please call our office at [phone] to schedule this appointment.  If you are age 81 or older, your body mass index should be between 23-30. Your Body mass index is 21.28 kg/m. If this is out of the aforementioned range listed, please consider follow up with your Primary Care Provider.  Thank you for trusting me with your gastrointestinal care!    Thornton Park, MD, MPH

## 2021-01-11 ENCOUNTER — Encounter: Payer: Self-pay | Admitting: Adult Health Nurse Practitioner

## 2021-01-26 ENCOUNTER — Other Ambulatory Visit: Payer: Self-pay | Admitting: Cardiovascular Disease

## 2021-01-30 ENCOUNTER — Other Ambulatory Visit: Payer: Self-pay | Admitting: Cardiovascular Disease

## 2021-02-04 ENCOUNTER — Other Ambulatory Visit: Payer: Self-pay | Admitting: Internal Medicine

## 2021-02-13 DIAGNOSIS — L0109 Other impetigo: Secondary | ICD-10-CM | POA: Diagnosis not present

## 2021-02-13 DIAGNOSIS — D692 Other nonthrombocytopenic purpura: Secondary | ICD-10-CM | POA: Diagnosis not present

## 2021-02-13 DIAGNOSIS — B078 Other viral warts: Secondary | ICD-10-CM | POA: Diagnosis not present

## 2021-02-13 DIAGNOSIS — Z85828 Personal history of other malignant neoplasm of skin: Secondary | ICD-10-CM | POA: Diagnosis not present

## 2021-02-18 DIAGNOSIS — M25512 Pain in left shoulder: Secondary | ICD-10-CM | POA: Diagnosis not present

## 2021-03-18 DIAGNOSIS — M25512 Pain in left shoulder: Secondary | ICD-10-CM | POA: Diagnosis not present

## 2021-03-23 ENCOUNTER — Ambulatory Visit: Payer: Medicare Other | Admitting: Adult Health Nurse Practitioner

## 2021-04-06 ENCOUNTER — Other Ambulatory Visit: Payer: Self-pay | Admitting: Internal Medicine

## 2021-04-08 ENCOUNTER — Other Ambulatory Visit: Payer: Self-pay | Admitting: Internal Medicine

## 2021-04-10 ENCOUNTER — Emergency Department (HOSPITAL_COMMUNITY)
Admission: EM | Admit: 2021-04-10 | Discharge: 2021-04-10 | Disposition: A | Discharge status: Home / Self Care | Payer: Medicare Other | Attending: Emergency Medicine | Admitting: Emergency Medicine

## 2021-04-10 ENCOUNTER — Encounter (HOSPITAL_COMMUNITY): Payer: Self-pay

## 2021-04-10 ENCOUNTER — Other Ambulatory Visit: Payer: Self-pay

## 2021-04-10 DIAGNOSIS — S8991XA Unspecified injury of right lower leg, initial encounter: Secondary | ICD-10-CM | POA: Diagnosis present

## 2021-04-10 DIAGNOSIS — Y92007 Garden or yard of unspecified non-institutional (private) residence as the place of occurrence of the external cause: Secondary | ICD-10-CM | POA: Diagnosis not present

## 2021-04-10 DIAGNOSIS — Z87891 Personal history of nicotine dependence: Secondary | ICD-10-CM | POA: Diagnosis not present

## 2021-04-10 DIAGNOSIS — W108XXA Fall (on) (from) other stairs and steps, initial encounter: Secondary | ICD-10-CM | POA: Diagnosis not present

## 2021-04-10 DIAGNOSIS — S81811A Laceration without foreign body, right lower leg, initial encounter: Secondary | ICD-10-CM | POA: Insufficient documentation

## 2021-04-10 DIAGNOSIS — Y9301 Activity, walking, marching and hiking: Secondary | ICD-10-CM | POA: Insufficient documentation

## 2021-04-10 MED ORDER — CEPHALEXIN 500 MG PO CAPS: 500.0000 mg | ORAL_CAPSULE | Freq: Three times a day (TID) | ORAL | 0 refills | Status: DC

## 2021-04-10 MED ORDER — HYDROCODONE-ACETAMINOPHEN 5-325 MG PO TABS: 1.0000 | ORAL_TABLET | Freq: Four times a day (QID) | ORAL | 0 refills | Status: DC | PRN

## 2021-04-10 MED ORDER — LIDOCAINE-EPINEPHRINE 2 %-1:100000 IJ SOLN
20.0000 mL | Freq: Once | INTRAMUSCULAR | Status: AC
  Administered 2021-04-10: 20 mL
  Filled 2021-04-10: qty 1

## 2021-04-10 MED ORDER — LIDOCAINE-EPINEPHRINE-TETRACAINE (LET) TOPICAL GEL
3.0000 mL | Freq: Once | TOPICAL | Status: AC
  Administered 2021-04-10: 3 mL via TOPICAL
  Filled 2021-04-10: qty 3

## 2021-04-10 MED ORDER — OXYCODONE-ACETAMINOPHEN 5-325 MG PO TABS
1.0000 | ORAL_TABLET | Freq: Once | ORAL | Status: AC
  Administered 2021-04-10: 1 via ORAL
  Filled 2021-04-10: qty 1

## 2021-04-10 NOTE — ED Triage Notes (Signed)
Patient reports that she stumbled going up some stone steps.  Patient has a lacerations to the right shin area and states a skin tear to the right elbow area. Patient had areas bandaged when she arrived.  Patient denies hitting her head and denies taking blood thinners.

## 2021-04-10 NOTE — ED Notes (Signed)
Suture cart at bedside 

## 2021-04-10 NOTE — Discharge Instructions (Signed)
Please keep wound dry for the first 24 to 48 hours.  After that, change dressing daily and monitor wound for any signs of infection.  Take antibiotic as prescribed.  Take Tylenol as needed for pain if pain is intense you may take opiate pain medication but be aware that it can cause drowsiness.  Return if you have any concern.

## 2021-04-10 NOTE — ED Provider Notes (Signed)
[facility] DEPT Provider Note   CSN: [id number] Arrival date & time: 04/10/21  1006     History Chief Complaint  Patient presents with  . Fall  . Laceration    Connie Alvarado is a 80 y.o. female.  The history is provided by the patient and a relative. No language interpreter was used.  Fall  Laceration    80 year old female significant history of B12 deficiency, vitamin D deficiency, presents for evaluation of a fall.  Patient states this morning she was walking up the steps from her backyard, but did not pick her leg up high enough, causing her to trip on her right foot and fell.  States that her shin struck the rock causing a skin tear.  She also struck her right forearm as well.  She denies hitting her head or loss of consciousness.  She endorsed moderate sharp pain to the affected area.  Pain is nonradiating without any associated numbness.  Daughter did try to cleanse the wounds and applied dressing.  She is up-to-date with tetanus.  She is not on any blood thinning medication.  She does not think she has any broken bones.  Past Medical History:  Diagnosis Date  . Anxiety   . Depression   . Hyperlipidemia   . Lymphocytic colitis   . Malaria    as a child  . Memory loss     Patient Active Problem List   Diagnosis Date Noted  . Aortic atherosclerosis ([place]) 06/11/2020  . Emphysema of lung ([place]) 06/11/2020  . Senile purpura ([place]) 04/22/2020  . Lymphocytic colitis 04/21/2020  . Lung nodules 05/15/2019  . Enrolled in chronic care management 03/28/2019  . B12 deficiency 03/27/2019  . Anemia 03/27/2019  . Vitamin D deficiency 03/27/2019  . 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 Medication management 03/08/2019  . Mixed hyperlipidemia 03/08/2019  . H/O prosthetic aortic valve replacement 03/08/2019    Past Surgical History:  Procedure  Laterality Date  . [place]  . CARDIAC VALVE SURGERY  2016     OB History   No obstetric history on file.     Family History  Problem Relation Age of Onset  . Hypertension Mother   . Hyperlipidemia Mother   . Breast cancer Mother   . Parkinson's disease Father   . Kidney disease Father   . Parkinsonism Father   . Colon cancer Neg Hx   . Esophageal cancer Neg Hx   . Rectal cancer Neg Hx   . Stomach cancer Neg Hx     Social History   Tobacco Use  . Smoking status: Former Smoker    Types: Cigarettes    Quit date: 1980    Years since quitting: 42.3  . Smokeless tobacco: Never Used  Vaping Use  . Vaping Use: Never used  Substance Use Topics  . Alcohol use: Yes    Alcohol/week: 14.0 standard drinks    Types: 14 Glasses of wine per week    Comment: wine at dinner   . Drug use: Never    Home Medications Prior to Admission medications   Medication Sig Start Date End Date Taking? Authorizing Provider  acetaminophen (TYLENOL) 325 MG tablet Take 650 mg by mouth every 6 (six) hours as needed.    [provider]  amoxicillin (AMOXIL) 500 MG capsule Take 2 grams (4 pills) before each of the three procedures. 03/19/20  Croitoru, Mihai, MD  ampicillin (PRINCIPEN) 500 MG capsule TAKE 4 CAPSULES BY MOUTH BEFORE EACH OF THE THREE PROCEDURE 08/13/20   Croitoru, Dani Gobble, MD  atorvastatin (LIPITOR) 80 MG tablet TAKE 1 TABLET BY MOUTH DAILY FOR CHOLESTEROL 02/04/21   McClanahan, Danton Sewer, NP  bismuth subsalicylate (PEPTO BISMOL) 262 MG/15ML suspension Take 30 mLs by mouth every 6 (six) hours as needed.    [provider]  Budesonide ER 9 MG TB24 Take 1 tablet by mouth daily. 01/07/21   Thornton Park, MD  diphenhydrAMINE (BENADRYL) 25 MG tablet Take 25 mg by mouth every 6 (six) hours as needed.    [provider]  VITAMIN D PO Take 1 tablet by mouth daily.    [provider]    Allergies    Penicillins  Review of Systems   Review of  Systems  All other systems reviewed and are negative.   Physical Exam Updated Vital Signs BP (!) 141/93   Pulse 82   Temp 98.4 F (36.9 C) (Oral)   Resp 17   Ht 5\' 4"  (1.626 m)   Wt 53.1 kg   SpO2 100%   BMI 20.08 kg/m   Physical Exam Vitals and nursing note reviewed.  Constitutional:      General: She is not in acute distress.    Appearance: She is well-developed.  HENT:     Head: Atraumatic.  Eyes:     Conjunctiva/sclera: Conjunctivae normal.  Musculoskeletal:        General: Signs of injury (Right lower extremity: There is a large skin flap approximately 14 cm in diameter noted to the anterior tib-fib region with minimal bleeding and no foreign body noted.  No bony tenderness.  Intact sensation distally with intact pedal pulse.) present.     Cervical back: Neck supple.     Comments: Right forearm: Small skin tear noted to proximal forearm without deep laceration or foreign body noted  Skin:    Findings: No rash.  Neurological:     Mental Status: She is alert.     ED Results / Procedures / Treatments   Labs (all labs ordered are listed, but only abnormal results are displayed) Labs Reviewed - No data to display  EKG None  Radiology No results found.  Procedures .Marland KitchenLaceration Repair  Date/Time: 04/10/2021 12:03 PM Performed by: Domenic Moras, PA-C Authorized by: Domenic Moras, PA-C   Consent:    Consent obtained:  Verbal   Consent given by:  Patient   Risks discussed:  Infection, need for additional repair, pain, poor cosmetic result and poor wound healing   Alternatives discussed:  No treatment and delayed treatment Universal protocol:    Procedure explained and questions answered to patient or proxy's satisfaction: yes     Relevant documents present and verified: yes     Test results available: yes     Imaging studies available: yes     Required blood products, implants, devices, and special equipment available: yes     Site/side marked: yes      Immediately prior to procedure, a time out was called: yes     Patient identity confirmed:  Verbally with patient Anesthesia:    Anesthesia method:  Local infiltration and topical application   Topical anesthetic:  LET   Local anesthetic:  Lidocaine 2% WITH epi Laceration details:    Location:  Leg   Leg location:  R lower leg   Length (cm):  14   Depth (mm):  2 Pre-procedure details:  Preparation:  Patient was prepped and draped in usual sterile fashion Exploration:    Limited defect created (wound extended): no     Hemostasis achieved with:  LET   Imaging outcome: foreign body not noted     Wound exploration: wound explored through full range of motion and entire depth of wound visualized     Wound extent: no muscle damage noted, no nerve damage noted, no tendon damage noted, no underlying fracture noted and no vascular damage noted     Contaminated: no   Treatment:    Area cleansed with:  Povidone-iodine   Amount of cleaning:  Standard   Irrigation solution:  Sterile saline   Irrigation method:  Pressure wash   Visualized foreign bodies/material removed: no     Debridement:  Minimal   Undermining:  None Skin repair:    Repair method:  Steri-Strips   Number of Steri-Strips:  12 Approximation:    Approximation:  Loose Repair type:    Repair type:  Complex Post-procedure details:    Dressing:  Bulky dressing and tube gauze   Procedure completion:  Tolerated well, no immediate complications     Medications Ordered in ED Medications  oxyCODONE-acetaminophen (PERCOCET/ROXICET) 5-325 MG per tablet 1 tablet (1 tablet Oral Given 04/10/21 1058)  lidocaine-EPINEPHrine-tetracaine (LET) topical gel (3 mLs Topical Given 04/10/21 1102)  lidocaine-EPINEPHrine (XYLOCAINE W/EPI) 2 %-1:100000 (with pres) injection 20 mL (20 mLs Infiltration Given 04/10/21 1106)    ED Course  I have reviewed the triage vital signs and the nursing notes.  Pertinent labs & imaging results that were  available during my care of the patient were reviewed by me and considered in my medical decision making (see chart for details).    MDM Rules/Calculators/A&P                          BP 131/84   Pulse 72   Temp 98.4 F (36.9 C) (Oral)   Resp 18   Ht 5\' 4"  (1.626 m)   Wt 53.1 kg   SpO2 97%   BMI 20.08 kg/m   Final Clinical Impression(s) / ED Diagnoses Final diagnoses:  Skin tear of right lower leg without complication, initial encounter    Rx / DC Orders ED Discharge Orders         Ordered    cephALEXin (KEFLEX) 500 MG capsule  3 times daily        04/10/21 1229    HYDROcodone-acetaminophen (NORCO/VICODIN) 5-325 MG tablet  Every 6 hours PRN        04/10/21 1229         10:57 AM Patient here for evaluation of a mechanical fall.  She has a large skin tear noted to her right anterior tib-fib region without any bony involvement.  Plan to perform laceration repair.  She is not on any blood thinner medication and did not hit her head having any other significant injury.  12:05 PM Patient with large skin flap that was not amenable for suture repair.  I did my best to approximate the skin and using Steri-Strips to anchored it.  Dressing applied.  Wound care instruction provided.  Due to the size of her wound, will prescribe antibiotic as well as opiate pain medication along with strict return precaution.  Care discussed with DR. Rogene Houston.     Domenic Moras, PA-C 04/10/21 1231    Fredia Sorrow, MD 04/10/21 [phone]

## 2021-04-10 NOTE — ED Notes (Signed)
LET applied to rt shin

## 2021-04-15 ENCOUNTER — Ambulatory Visit (INDEPENDENT_AMBULATORY_CARE_PROVIDER_SITE_OTHER): Payer: Medicare Other | Admitting: Adult Health

## 2021-04-15 ENCOUNTER — Encounter: Payer: Self-pay | Admitting: Adult Health

## 2021-04-15 ENCOUNTER — Other Ambulatory Visit: Payer: Self-pay

## 2021-04-15 VITALS — BP 140/80 | HR 72 | Temp 97.0°F | Wt 125.0 lb

## 2021-04-15 DIAGNOSIS — L089 Local infection of the skin and subcutaneous tissue, unspecified: Secondary | ICD-10-CM

## 2021-04-15 DIAGNOSIS — S81811D Laceration without foreign body, right lower leg, subsequent encounter: Secondary | ICD-10-CM

## 2021-04-15 MED ORDER — CEPHALEXIN 500 MG PO CAPS: 500.0000 mg | ORAL_CAPSULE | Freq: Three times a day (TID) | ORAL | 0 refills | Status: DC

## 2021-04-15 MED ORDER — VENLAFAXINE HCL ER 37.5 MG PO CP24: 37.5000 mg | ORAL_CAPSULE | Freq: Every day | ORAL | 2 refills | Status: DC

## 2021-04-15 NOTE — Progress Notes (Signed)
Assessment and Plan:  Connie Alvarado was seen today for laceration.  Diagnoses and all orders for this visit:  Skin tear of right lower leg without complication, subsequent encounter Wound infection Concern with short duration keflex; after discussion will extend for an additional 7-10 days to avoid treatment failure; mild erythema and questionable scant purulent discharge, high risk complication with extensive wound in aging adult Continue daily check and dressing change by daughter Suggested vit C, zinc addition; continue vit D Monitor closely for any worsening erythema, heat, fever/chills, pain or edema, follow up if not resolving;  Daughter present and able to verbalize back instructions; she will reach out if any concerns -     cephALEXin (KEFLEX) 500 MG capsule; Take 1 capsule (500 mg total) by mouth 3 (three) times daily.  Other orders -     venlafaxine XR (EFFEXOR-XR) 37.5 MG 24 hr capsule; Take 1 capsule (37.5 mg total) by mouth daily.  Further disposition pending results of labs. Discussed med's effects and SE's.   Over 20 minutes of exam, counseling, chart review, and critical decision making was performed.   Future Appointments  Date Time Provider [place]  04/29/2021 81:00 PM Croitoru, Mihai, MD [facility]  05/14/2021 81:45 AM [name], NP [facility] None    ------------------------------------------------------------------------------------------------------------------   HPI BP 140/80   Pulse 72   Temp (!) 97 F (36.1 C)   Wt 125 lb (56.7 kg)   SpO2 99%   BMI 21.46 kg/m   80 y.o.female with alcohol abuse disorder with some memory concerns presents for ED follow up for R shin skin tear accompanied by her daughter. She was seen 04/10/2021 for skin tear of RLL following mechanical fall. Secured by steri strips and was placed on 500 mg TID keflex, presents for follow up. Daughter has been changing dressing daily, notes some bloody discharge, does seem  to be improving but concerned about only having 2 tabs of abx left. Pain reasonably managed by tylenol.   Past Medical History:  Diagnosis Date  . Anxiety   . Depression   . Hyperlipidemia   . Lymphocytic colitis   . Malaria    as a child  . Memory loss      Allergies  Allergen Reactions  . Penicillins     Current Outpatient Medications on File Prior to Visit  Medication Sig  . acetaminophen (TYLENOL) 325 MG tablet Take 650 mg by mouth every 6 (six) hours as needed.  Marland Kitchen amoxicillin (AMOXIL) 500 MG capsule Take 2 grams (4 pills) before each of the three procedures.  Marland Kitchen ampicillin (PRINCIPEN) 500 MG capsule TAKE 4 CAPSULES BY MOUTH BEFORE EACH OF THE THREE PROCEDURE  . atorvastatin (LIPITOR) 80 MG tablet TAKE 1 TABLET BY MOUTH DAILY FOR CHOLESTEROL  . bismuth subsalicylate (PEPTO BISMOL) 262 MG/15ML suspension Take 30 mLs by mouth every 6 (six) hours as needed.  . Budesonide ER 9 MG TB24 Take 1 tablet by mouth daily.  . cephALEXin (KEFLEX) 500 MG capsule Take 1 capsule (500 mg total) by mouth 3 (three) times daily.  . diphenhydrAMINE (BENADRYL) 25 MG tablet Take 25 mg by mouth every 6 (six) hours as needed.  Marland Kitchen HYDROcodone-acetaminophen (NORCO/VICODIN) 5-325 MG tablet Take 1 tablet by mouth every 6 (six) hours as needed for moderate pain or severe pain.  Marland Kitchen VITAMIN D PO Take 1 tablet by mouth daily.  Marland Kitchen venlafaxine XR (EFFEXOR-XR) 37.5 MG 24 hr capsule Take 37.5 mg by mouth daily. (Patient not taking: Reported on 04/15/2021)  No current facility-administered medications on file prior to visit.    ROS: all negative except above.   Physical Exam:  BP 140/80   Pulse 72   Temp (!) 97 F (36.1 C)   Wt 125 lb (56.7 kg)   SpO2 99%   BMI 21.46 kg/m   General Appearance: Well nourished, in no apparent distress. Eyes: PERRLA, EOMs, conjunctiva no swelling or erythema Sinuses: No Frontal/maxillary tenderness ENT/Mouth: Ext aud canals clear, TMs without erythema, bulging. No erythema,  swelling, or exudate on post pharynx.  Tonsils not swollen or erythematous. Hearing normal.  Neck: Supple, thyroid normal.  Respiratory: Respiratory effort normal, BS equal bilaterally without rales, rhonchi, wheezing or stridor.  Cardio: RRR with no MRGs. Brisk peripheral pulses without edema.  Abdomen: Soft, + BS.  Non tender, no guarding, rebound, hernias, masses. Lymphatics: Non tender without lymphadenopathy.  Musculoskeletal: Full ROM, 5/5 strength, normal gait.  Skin: Warm, dry; thin/fragile, extensive extremity ecchymoses; R shin skin tear wound, extensive 8 cm x 12 cm or so area with steri strips intact, scant sanguinous discharge, mild yellow crusting; no foul odor; mild erythema seen at borders, extensive ecchymosis surrounding Neuro: Normal muscle tone, Sensation intact. Repeats similar questions,  Psych: Awake and oriented X 3, normal affect, Insight and Judgment fair  Connie Ribas, NP 9:22 AM [facility]

## 2021-04-15 NOTE — Patient Instructions (Addendum)
Vitamin C supplement 500 mg twice daily   Zinc 25-30 mg/day   To assist with wound healing    Wound Care, Adult Taking care of your wound properly can help to prevent pain, infection, and scarring. It can also help your wound heal more quickly. Follow instructions from your health care provider about how to care for your wound. Supplies needed:  Soap and water.  Wound cleanser.  Gauze.  If needed, a clean bandage (dressing) or other type of wound dressing material to cover or place in the wound. Follow your health care provider's instructions about what dressing supplies to use.  Cream or ointment to apply to the wound, if told by your health care provider. How to care for your wound Cleaning the wound Ask your health care provider how to clean the wound. This may include:  Using mild soap and water or a wound cleanser.  Using a clean gauze to pat the wound dry after cleaning it. Do not rub or scrub the wound. Dressing care  Wash your hands with soap and water for at least 20 seconds before and after you change the dressing. If soap and water are not available, use hand sanitizer.  Change your dressing as told by your health care provider. This may include: ? Cleaning or rinsing out (irrigating) the wound. ? Placing a dressing over the wound or in the wound (packing). ? Covering the wound with an outer dressing.  Leave any stitches (sutures), skin glue, or adhesive strips in place. These skin closures may need to stay in place for 2 weeks or longer. If adhesive strip edges start to loosen and curl up, you may trim the loose edges. Do not remove adhesive strips completely unless your health care provider tells you to do that.  Ask your health care provider when you can leave the wound uncovered. Checking for infection Check your wound area every day for signs of infection. Check for:  More redness, swelling, or pain.  Fluid or blood.  Warmth.  Pus or a bad smell.    Follow these instructions at home Medicines  If you were prescribed an antibiotic medicine, cream, or ointment, take or apply it as told by your health care provider. Do not stop using the antibiotic even if your condition improves.  If you were prescribed pain medicine, take it 30 minutes before you do any wound care or as told by your health care provider.  Take over-the-counter and prescription medicines only as told by your health care provider. Eating and drinking  Eat a diet that includes protein, vitamin A, vitamin C, and other nutrient-rich foods to help the wound heal. ? Foods rich in protein include meat, fish, eggs, dairy, beans, and nuts. ? Foods rich in vitamin A include carrots and dark green, leafy vegetables. ? Foods rich in vitamin C include citrus fruits, tomatoes, broccoli, and peppers.  Drink enough fluid to keep your urine pale yellow. General instructions  Do not take baths, swim, use a hot tub, or do anything that would put the wound underwater until your health care provider approves. Ask your health care provider if you may take showers. You may only be allowed to take sponge baths.  Do not scratch or pick at the wound. Keep it covered as told by your health care provider.  Return to your normal activities as told by your health care provider. Ask your health care provider what activities are safe for you.  Protect your wound from  the sun when you are outside for the first 6 months, or for as long as told by your health care provider. Cover up the scar area or apply sunscreen that has an SPF of at least 52.  Do not use any products that contain nicotine or tobacco, such as cigarettes, e-cigarettes, and chewing tobacco. These may delay wound healing. If you need help quitting, ask your health care provider.  Keep all follow-up visits as told by your health care provider. This is important. Contact a health care provider if:  You received a tetanus shot and you  have swelling, severe pain, redness, or bleeding at the injection site.  Your pain is not controlled with medicine.  You have any of these signs of infection: ? More redness, swelling, or pain around the wound. ? Fluid or blood coming from the wound. ? Warmth coming from the wound. ? Pus or a bad smell coming from the wound. ? A fever or chills.  You are nauseous or you vomit.  You are dizzy. Get help right away if:  You have a red streak of skin near the area around your wound.  Your wound has been closed with staples, sutures, skin glue, or adhesive strips and it begins to open up and separate.  Your wound is bleeding, and the bleeding does not stop with gentle pressure.  You have a rash.  You faint.  You have trouble breathing. These symptoms may represent a serious problem that is an emergency. Do not wait to see if the symptoms will go away. Get medical help right away. Call your local emergency services (911 in the [place] Do not drive yourself to the hospital. Summary  Always wash your hands with soap and water for at least 20 seconds before and after changing your dressing.  Change your dressing as told by your health care provider.  To help with healing, eat foods that are rich in protein, vitamin A, vitamin C, and other nutrients.  Check your wound every day for signs of infection. Contact your health care provider if you suspect that your wound is infected. This information is not intended to replace advice given to you by your health care provider. Make sure you discuss any questions you have with your health care provider. Document Revised: 09/28/2019 Document Reviewed: 09/28/2019 Elsevier Patient Education  2021 [employer].

## 2021-04-27 ENCOUNTER — Ambulatory Visit: Payer: Medicare Other | Admitting: Physician Assistant

## 2021-04-27 ENCOUNTER — Ambulatory Visit: Payer: Medicare Other | Admitting: Adult Health Nurse Practitioner

## 2021-04-27 ENCOUNTER — Other Ambulatory Visit: Payer: Self-pay | Admitting: Adult Health

## 2021-04-27 MED ORDER — CEPHALEXIN 500 MG PO CAPS: 500.0000 mg | ORAL_CAPSULE | Freq: Three times a day (TID) | ORAL | 0 refills | Status: DC

## 2021-04-28 ENCOUNTER — Ambulatory Visit: Payer: Medicare Other | Admitting: Internal Medicine

## 2021-04-29 ENCOUNTER — Other Ambulatory Visit: Payer: Self-pay

## 2021-04-29 ENCOUNTER — Ambulatory Visit (INDEPENDENT_AMBULATORY_CARE_PROVIDER_SITE_OTHER): Payer: Medicare Other | Admitting: Cardiovascular Disease

## 2021-04-29 ENCOUNTER — Encounter: Payer: Self-pay | Admitting: Cardiovascular Disease

## 2021-04-29 VITALS — BP 128/90 | HR 87 | Ht 63.0 in | Wt 124.0 lb

## 2021-04-29 DIAGNOSIS — W19XXXA Unspecified fall, initial encounter: Secondary | ICD-10-CM | POA: Diagnosis not present

## 2021-04-29 DIAGNOSIS — E7800 Pure hypercholesterolemia, unspecified: Secondary | ICD-10-CM | POA: Diagnosis not present

## 2021-04-29 DIAGNOSIS — Z952 Presence of prosthetic heart valve: Secondary | ICD-10-CM

## 2021-04-29 MED ORDER — AMOXICILLIN 500 MG PO CAPS: ORAL_CAPSULE | ORAL | 1 refills | Status: DC

## 2021-04-29 NOTE — Patient Instructions (Signed)
Medication Instructions:  No changes *If you need a refill on your cardiac medications before your next appointment, please call your pharmacy*   Lab Work: None ordered If you have labs (blood work) drawn today and your tests are completely normal, you will receive your results only by: Marland Kitchen MyChart Message (if you have MyChart) OR . A paper copy in the mail If you have any lab test that is abnormal or we need to change your treatment, we will call you to review the results.   Testing/Procedures: Your physician has requested that you have an echocardiogram in May 2023. Echocardiography is a painless test that uses sound waves to create images of your heart. It provides your doctor with information about the size and shape of your heart and how well your heart's chambers and valves are working. You may receive an ultrasound enhancing agent through an IV if needed to better visualize your heart during the echo.This procedure takes approximately one hour. There are no restrictions for this procedure. This will take place at the [address].     Follow-Up: At [facility], you and your health needs are our priority.  As part of our continuing mission to provide you with exceptional heart care, we have created designated Provider Care Teams.  These Care Teams include your primary Cardiologist (physician) and Advanced Practice Providers (APPs -  Physician Assistants and Nurse Practitioners) who all work together to provide you with the care you need, when you need it.  We recommend signing up for the patient portal called "MyChart".  Sign up information is provided on this After Visit Summary.  MyChart is used to connect with patients for Virtual Visits (Telemedicine).  Patients are able to view lab/test results, encounter notes, upcoming appointments, etc.  Non-urgent messages can be sent to your provider as well.   To learn more about what you can do with MyChart, go to  [email or web address].    Your next appointment:   12 month(s) after the echo  The format for your next appointment:   In Person  Provider:   You may see Sanda Klein, MD or one of the following Advanced Practice Providers on your designated Care Team:    Almyra Deforest, PA-C  Fabian Sharp, PA-C or   Roby Lofts, [place]

## 2021-04-29 NOTE — Progress Notes (Signed)
Cardiology office note   Date:  04/29/2021   ID:  Connie Alvarado, DOB 1941/07/02, MRN [id number]  Patient Location: Home Provider Location: Office  PCP:  Unk Pinto, MD  Cardiologist:  New / [facility] [place] Electrophysiologist:  None   Evaluation Performed: Follow-up  Chief Complaint:  Aortic valve disease (s/p AVR, increased gradients)  History of Present Illness:    Connie Alvarado is a 80 y.o. 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  Doing okay from a cardiovascular point of view, she has had at least 2 falls that have required urgent attention.  She has a lot of Steri-Strips on a long gash in her right anterior tibial area.  Smaller area of split skin left elbow and an even smaller one under the left knee.  1 fall happened when climbing uneven stone steps in the backyard.  The other was a trip on the stairs in the home.  Neither 1 was associated with head injury or serious injury.  Neither 1 was preceded by dizziness or syncope.  She pointed out that the falls occurred in the morning when she had not "had any wine yet".  Echocardiogram performed in June 2020 and May 2021 have shown virtually no change in aortic valve parameters, when compared with the most recent echocardiogram from July 2019 from [place].   Past Medical History:  Diagnosis Date  . Anxiety   . Depression   . Hyperlipidemia   . Lymphocytic colitis   . Malaria    as a child  . Memory loss    Past Surgical History:  Procedure Laterality Date  . [place]  . CARDIAC VALVE SURGERY  2016     Current Meds  Medication Sig  . acetaminophen (TYLENOL) 325 MG tablet Take 650 mg by mouth every 6 (six) hours as needed.  Marland Kitchen amoxicillin (AMOXIL) 500 MG capsule Take 2 grams (4 pills) before each of the three procedures.  Marland Kitchen ampicillin  (PRINCIPEN) 500 MG capsule TAKE 4 CAPSULES BY MOUTH BEFORE EACH OF THE THREE PROCEDURE  . atorvastatin (LIPITOR) 80 MG tablet TAKE 1 TABLET BY MOUTH DAILY FOR CHOLESTEROL  . bismuth subsalicylate (PEPTO BISMOL) 262 MG/15ML suspension Take 30 mLs by mouth every 6 (six) hours as needed.  . Budesonide ER 9 MG TB24 Take 1 tablet by mouth daily.  . diphenhydrAMINE (BENADRYL) 25 MG tablet Take 25 mg by mouth every 6 (six) hours as needed.  . vitamin C (ASCORBIC ACID) 500 MG tablet Take 500 mg by mouth daily.  Marland Kitchen VITAMIN D PO Take 1 tablet by mouth daily.  . Zinc-Vitamin C (ZINC-A-COLD/VITAMIN C MT) Use as directed in the mouth or throat.  . [DISCONTINUED] cephALEXin (KEFLEX) 500 MG capsule Take 1 capsule (500 mg total) by mouth 3 (three) times daily.  . [DISCONTINUED] HYDROcodone-acetaminophen (NORCO/VICODIN) 5-325 MG tablet Take 1 tablet by mouth every 6 (six) hours as needed for moderate pain or severe pain.  . [DISCONTINUED] venlafaxine XR (EFFEXOR-XR) 37.5 MG 24 hr capsule Take 1 capsule (37.5 mg total) by mouth daily.     Allergies:   Penicillins   Social History   Tobacco Use  . Smoking status: Former Smoker    Types: Cigarettes    Quit date: 1980    Years since quitting: 42.3  . Smokeless tobacco: Never Used  Vaping Use  . Vaping Use: Never  used  Substance Use Topics  . Alcohol use: Yes    Alcohol/week: 14.0 standard drinks    Types: 14 Glasses of wine per week    Comment: wine at dinner   . Drug use: Never     Family Hx: The patient's family history includes Breast cancer in her mother; Hyperlipidemia in her mother; Hypertension in her mother; Kidney disease in her father; Parkinson's disease in her father; Parkinsonism in her father. There is no history of Colon cancer, Esophageal cancer, Rectal cancer, or Stomach cancer.  ROS:   Please see the history of present illness.     All other systems reviewed and are negative.   Prior CV studies:   The following studies were  reviewed today:  Echo report from [place] 07/03/2018 peak aortic prosthesis velocity 3.14 m/s, mean gradient 20 mmHg  ECHO 05/13/2020:  1. Left ventricular ejection fraction, by estimation, is 60 to 65%. The  left ventricle has normal function. The left ventricle has no regional  wall motion abnormalities. There is mild concentric left ventricular  hypertrophy. Left ventricular diastolic  parameters are consistent with Grade I diastolic dysfunction (impaired  relaxation).  2. Right ventricular systolic function is normal. The right ventricular  size is normal. There is normal pulmonary artery systolic pressure.  3. Right atrial size was moderately dilated.  4. The mitral valve is normal in structure. Trivial mitral valve  regurgitation. No evidence of mitral stenosis.  5. Tricuspid valve regurgitation is mild to moderate.  6. TAVR aortic valve gradient mild-moderately elevated. Compared with the  echo 05/2019, mean aortic valve gradient is slightly slower. It has  decreased from 25 mmHg to 21 mmHg. The aortic valve has been  repaired/replaced. Aortic valve regurgitation is  not visualized. No aortic stenosis is present. Aortic valve mean gradient  measures 21.0 mmHg. Aortic valve Vmax measures 2.83 m/s.  7. Aortic dilatation noted. 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  8. The inferior vena cava is normal in size with greater than 50%  respiratory variability, suggesting right atrial pressure of 3 mmHg.   Labs/Other Tests and Data Reviewed:    EKG: electrocardiogram was ordered today and shows normal sinus rhythm, QS pattern in leads V1-V2, no repolarization abnormalities, no signs of LVH.  Recent Labs: 06/16/2020: Magnesium 2.1; TSH 1.16 11/04/2020: ALT 18; BUN 21; Creat 0.66; Hemoglobin 13.3; Platelets 163; Potassium 4.7; Sodium 144   Recent Lipid Panel Lab Results  Component Value Date/Time   CHOL 216 (H) 04/22/2020 03:41 PM   TRIG 79  04/22/2020 03:41 PM   HDL 91 04/22/2020 03:41 PM   CHOLHDL 2.4 04/22/2020 03:41 PM   LDLCALC 108 (H) 04/22/2020 03:41 PM    Wt Readings from Last 3 Encounters:  04/29/21 124 lb (56.2 kg)  04/15/21 125 lb (56.7 kg)  04/10/21 117 lb (53.1 kg)     Objective:    Vital Signs:  BP 128/90   Pulse 87   Ht 5\' 3"  (1.6 m)   Wt 124 lb (56.2 kg)   SpO2 98%   BMI 21.97 kg/m    General: Alert, oriented x3, no distress, lean, appears a little fragile Head: no evidence of trauma, PERRL, EOMI, no exophtalmos or lid lag, no myxedema, no xanthelasma; normal ears, nose and oropharynx Neck: normal jugular venous pulsations and no hepatojugular reflux; brisk carotid pulses without delay and no carotid bruits Chest: clear to auscultation, no signs of consolidation by percussion or palpation, normal fremitus,  symmetrical and full respiratory excursions Cardiovascular: normal position and quality of the apical impulse, regular rhythm, normal first and second heart sounds, early peaking 1/6 aortic ejection murmur, no diastolic murmurs, rubs or gallops Abdomen: no tenderness or distention, no masses by palpation, no abnormal pulsatility or arterial bruits, normal bowel sounds, no hepatosplenomegaly Extremities: no clubbing, cyanosis or edema; 2+ radial, ulnar and brachial pulses bilaterally; 2+ right femoral, posterior tibial and dorsalis pedis pulses; 2+ left femoral, posterior tibial and dorsalis pedis pulses; no subclavian or femoral bruits Neurological: grossly nonfocal Psych: Normal mood and affect Skin: She has several skin lacerations.  An approximately 10 cm laceration on her anterior right tibial area is covered with several Steri-Strips and seems to be healing well without drainage or surrounding erythema.  There is 1 tiny little spot that may have some very superficial infection.  There is a 1 cm split in the skin just underneath the left knee and a roughly 3 cm tear just underneath the left elbow,  neither 1 with any erythema, drainage or other signs of infection.   No diagnosis found.  ASSESSMENT & PLAN:    1. AS s/p (redo) AVR: Aortic prosthesis gradients have been mildly elevated, but essentially unchanged for at least the last 3 years.  There is no evidence of progressive prosthetic decline, but rather some degree of prosthetic valve mismatch (valve in valve 23 mm TAVR).  We will repeat the echocardiogram in 1 year.  Asked her to call sooner for any symptoms of exertional dyspnea, exertional angina or exertional syncope.  Renewed the prescription for amoxicillin before dental procedures and understands how to take SBE prophylaxis. 2. Hyperlipidemia: No known CAD or PAD.  LDL 108 not far from our target of LDL less than 100.  Continue statin. 3. Falls: She has had a couple of superficial skin lacerations recently.  I am concerned about her poor level of conditioning and the risk for future falls.  Discussed precautions such as removing obstacles and dangerous rugs/side tables/etc.  Patient Instructions  Medication Instructions:  No changes *If you need a refill on your cardiac medications before your next appointment, please call your pharmacy*   Lab Work: None ordered If you have labs (blood work) drawn today and your tests are completely normal, you will receive your results only by: Marland Kitchen MyChart Message (if you have MyChart) OR . A paper copy in the mail If you have any lab test that is abnormal or we need to change your treatment, we will call you to review the results.   Testing/Procedures: Your physician has requested that you have an echocardiogram in May 2023. Echocardiography is a painless test that uses sound waves to create images of your heart. It provides your doctor with information about the size and shape of your heart and how well your heart's chambers and valves are working. You may receive an ultrasound enhancing agent through an IV if needed to better visualize  your heart during the echo.This procedure takes approximately one hour. There are no restrictions for this procedure. This will take place at the [address].     Follow-Up: At [facility], you and your health needs are our priority.  As part of our continuing mission to provide you with exceptional heart care, we have created designated Provider Care Teams.  These Care Teams include your primary Cardiologist (physician) and Advanced Practice Providers (APPs -  Physician Assistants and Nurse Practitioners) who all work together to provide  you with the care you need, when you need it.  We recommend signing up for the patient portal called "MyChart".  Sign up information is provided on this After Visit Summary.  MyChart is used to connect with patients for Virtual Visits (Telemedicine).  Patients are able to view lab/test results, encounter notes, upcoming appointments, etc.  Non-urgent messages can be sent to your provider as well.   To learn more about what you can do with MyChart, go to [email or web address].    Your next appointment:   12 month(s) after the echo  The format for your next appointment:   In Person  Provider:   You may see Sanda Klein, MD or one of the following Advanced Practice Providers on your designated Care Team:    Almyra Deforest, PA-C  Fabian Sharp, [place] or   Roby Lofts, PA-C      Signed, Sanda Klein, MD  04/29/2021 3:14 PM    [place]

## 2021-05-04 ENCOUNTER — Telehealth: Payer: Self-pay | Admitting: Cardiovascular Disease

## 2021-05-04 NOTE — Telephone Encounter (Signed)
Sent to Dr Sallyanne Kuster to review Once reviewed will contact pharmacy

## 2021-05-04 NOTE — Telephone Encounter (Signed)
Left a message for the patient's daughter to call back.  

## 2021-05-04 NOTE — Telephone Encounter (Signed)
Left a message for the daughter to call back concerning the amoxicillin.

## 2021-05-04 NOTE — Telephone Encounter (Signed)
I sent her a text as well.

## 2021-05-04 NOTE — Telephone Encounter (Signed)
     Pt c/o medication issue:  1. Name of Medication:   amoxicillin (AMOXIL) 500 MG capsule    2. How are you currently taking this medication (dosage and times per day)? Take 2 grams (4 pills) before a dental visit  3. Are you having a reaction (difficulty breathing--STAT)?   4. What is your medication issue? Baxter Flattery with [employer], she said pt is allergic to penicillin and would like to verify if Dr. Loletha Grayer still approved for pt to take this medication

## 2021-05-05 ENCOUNTER — Other Ambulatory Visit: Payer: Self-pay | Admitting: Adult Health

## 2021-05-05 MED ORDER — CEPHALEXIN 500 MG PO CAPS: 500.0000 mg | ORAL_CAPSULE | Freq: Three times a day (TID) | ORAL | 0 refills | Status: DC

## 2021-05-06 NOTE — Telephone Encounter (Signed)
Left a message for the patient's daughter to call back about the antibiotic

## 2021-05-08 ENCOUNTER — Encounter: Payer: Self-pay | Admitting: *Deleted

## 2021-05-08 NOTE — Telephone Encounter (Signed)
Have been unable to reach the patient and the daughter. MyChart message has been sent.

## 2021-05-08 NOTE — Telephone Encounter (Signed)
Patient's daughter had sent a message to Dr. Sallyanne Kuster stating that the patient can take amoxacillin and has taken it previously without any problems.   Call placed to [facility], [place]. They have verbalized their understanding.

## 2021-05-13 DIAGNOSIS — F0390 Unspecified dementia without behavioral disturbance: Secondary | ICD-10-CM | POA: Insufficient documentation

## 2021-05-13 DIAGNOSIS — R413 Other amnesia: Secondary | ICD-10-CM | POA: Insufficient documentation

## 2021-05-13 DIAGNOSIS — E519 Thiamine deficiency, unspecified: Secondary | ICD-10-CM | POA: Insufficient documentation

## 2021-05-13 NOTE — Progress Notes (Signed)
MEDICARE WELLNESS  Assessment and Plan:   Encounter for Medicare annual wellness exam 1 year Schedule DEXA with mammogram - phone number given Pneumonia vaccine next visit   Atherosclerosis of aorta ([place]) Per CT 05/2020 Control blood pressure, cholesterol, glucose, increase exercise.   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   H/O prosthetic aortic valve replacement Continue follow up cardio  Medication management -     CBC with Differential/Platelet -     COMPLETE METABOLIC PANEL WITH GFR -     Magnesium  Vitamin D deficiency -     VITAMIN D 25 Hydroxy (Vit-D Deficiency, Fractures)  Anemia, unspecified type -     CBC with Differential/Platelet  Emphysema (HCC) Per imaging, remote smoker, denies sx; monitor  Lung nodules 06/10/2020, had CT that showed stable 0.6cm left lower lobe 1 year follow up if high risk, does have smoking history, will repeat  Lymphocytic colitis Stop drinking, improved with budesonide; continue to follow up GI  Memory loss Has seen neuro Dr. Leta Baptist 29/30 on MMSE, mood is improved, has reduced alcohol, on B complex with improved labs Daughter feels memory is progressive; repeatedly asking the same questions; given neuro number to schedule follow up per her preference.   High fall risk Discussed and will refer to PT for gait training with established ortho PT She has done well reducing alcohol Fall  prevention in home discussed   Infected wound No discharge to culture but appears worse despite kefex, will swich to doxycycline; continue close monitoring and daily dressing changes Daughter monitoring closely; follow up if not improving or any worsening sx.  Discussed med's effects and SE's. Screening labs and tests as requested with regular follow-up as recommended. Over 40 minutes of exam, counseling, chart review, and complex, high level critical decision making was performed this visit.   Future Appointments  Date Time Provider [place]  05/17/2022 11:00 AM [name], NP [facility] None     Plan:   During the course of the visit the patient was educated and counseled about appropriate screening and preventive services including:    Pneumococcal vaccine   Prevnar 13  Influenza vaccine  Td vaccine  Screening electrocardiogram  Bone densitometry screening  Colorectal cancer screening  Diabetes screening  Glaucoma screening  Nutrition counseling   Advanced directives: requested   HPI  80 y.o. female  presents for medicare wellness and follow up for has Alcohol abuse with alcohol-induced disorder ([employer]); Medication management; Mixed hyperlipidemia; H/O prosthetic aortic valve replacement; B12 deficiency; Vitamin D deficiency; Depression, major, recurrent, in partial remission ([place]); Lung nodules; Lymphocytic colitis; Senile purpura ([place]); Aortic atherosclerosis ([place]); Emphysema of lung ([place]); Memory problem; and Thiamine deficiency on their problem list.   She had mechanical fall climbing stairs with R shin wound/skin tear, was seen at ED with steri strip dressing, had signs of infection and has been on keflex 500 mg TID, doing well with daily dressing changes by daughter [name], but reports last 2 days increased redness and swelling. Daughter does note several falls  in the last 2 years.   She has history of anxiety, alcohol abuse, memory problems; her  husband had kidney cancer and passed 2020. Moved in with her daughter, [name] due to memory concerns. She was on numerous mood meds without benefit, concern with trazodone/benzo with alcohol and was stopped, daughter is limiting alcohol recently to 1/day. Memory continues to be an issue, frequently asks the same questions.   She has seen Dr. Kathlen Mody, last visit 01/2020, normal MRI  Brain 04/2019. Last MMSE 29. 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 She also had a low B1 of 7 02/2019 and is on B complex, improved to 26 at last check 11/04/2020.  She was found to have lymphocytic colitis on colonoscopy in 06/2019, is on budesonide and is following with GI Dr. Tarri Glenn, well controlled.  Had CT 06/10/2020 showing stable 6 mm left lower lobe nodule, recommedned 12 month follow up if high risk, CT also showed emphysematous changes and extensive aortic atherosclerosis. She is remote former smoker.   BMI is Body mass index is 22.32 kg/m., she has not been working on diet and exercise. Wt Readings from Last 3 Encounters:  05/14/21 126 lb (57.2 kg)  04/29/21 124 lb (56.2 kg)  04/15/21 125 lb (56.7 kg)   Her blood pressure has been controlled at home, today their BP is BP: 104/64 She does not workout. She denies chest pain, shortness of breath, dizziness.   She has a history of hyperlipidemia on liptior and has history of bovine aortic valve replacement x 2, last echo was 05/2019, following with Dr. Sallyanne Kuster  Her blood pressure has been controlled at home, today their BP is BP: 104/64  She does not workout. She denies chest pain, shortness of breath, dizziness.   She is on cholesterol medication and denies myalgias. Her cholesterol is not at goal. The cholesterol last visit was:   Lab Results  Component Value Date   CHOL 216 (H) 04/22/2020    HDL 91 04/22/2020   LDLCALC 108 (H) 04/22/2020   TRIG 79 04/22/2020   CHOLHDL 2.4 04/22/2020   Last A1C in the office was:  Lab Results  Component Value Date   HGBA1C 5.6 10/22/2019    Last GFR:  Lab Results  Component Value Date   GFRNONAA 83 11/04/2020   Patient is on Vitamin D supplement.   Lab Results  Component Value Date   VD25OH 47 04/22/2020        Current Medications:   Current Outpatient Medications (Endocrine & Metabolic):  Marland Kitchen  Budesonide ER 9 MG TB24, Take 1 tablet by mouth daily.  Current Outpatient Medications (Cardiovascular):  .  atorvastatin (LIPITOR) 80 MG tablet, TAKE 1 TABLET BY MOUTH DAILY FOR CHOLESTEROL  Current Outpatient Medications (Respiratory):  .  diphenhydrAMINE (BENADRYL) 25 MG tablet, Take 25 mg by mouth every 6 (six) hours as needed.  Current Outpatient Medications (Analgesics):  .  acetaminophen (TYLENOL) 325 MG tablet, Take 650 mg by mouth every 6 (six) hours as needed.   Current Outpatient Medications (Other):  .  amoxicillin (AMOXIL) 500 MG capsule, Take 2 grams (4 pills) before a dental visit .  ampicillin (PRINCIPEN) 500 MG capsule, TAKE 4 CAPSULES BY MOUTH BEFORE EACH OF THE THREE PROCEDURE .  bismuth subsalicylate (PEPTO BISMOL) 262 MG/15ML suspension, Take 30 mLs by mouth every 6 (six) hours as needed. Marland Kitchen  doxycycline (VIBRAMYCIN) 100 MG capsule, Take 1 capsule twice daily with food .  vitamin C (ASCORBIC ACID) 500 MG tablet, Take 500 mg by mouth daily. Marland Kitchen  VITAMIN D PO, Take 1 tablet by mouth daily. .  Zinc-Vitamin C (ZINC-A-COLD/VITAMIN C MT), Use as directed in the mouth or throat.  Allergies:  Allergies  Allergen Reactions  . Penicillins    Medical History:  She has Alcohol abuse with alcohol-induced disorder ([place]); Medication management; Mixed hyperlipidemia; H/O prosthetic aortic valve replacement; B12 deficiency; Vitamin D deficiency; Depression, major, recurrent, in partial remission ([place]); Lung nodules;  Lymphocytic colitis; Senile purpura ([place]); Aortic atherosclerosis ([place]); Emphysema of lung ([place]); Memory problem; and Thiamine deficiency on their problem list.   Patient Care Team: Unk Pinto, MD as PCP - General (Internal Medicine) Croitoru, Dani Gobble, MD as PCP - Cardiology (Cardiology) [name], MD as Consulting Physician (Neurology)  Surgical History:  She has a past surgical history that includes Cardiac valuve replacement (1999) and Cardiac valuve replacement (2016). Family History:  Herfamily history includes Breast cancer in her mother; Hyperlipidemia in her mother; Hypertension in her mother; Kidney disease in her father; Parkinson's disease in her father; Parkinsonism in her father. Social History:  She reports that she quit smoking about 42 years ago. Her smoking use included cigarettes. She has never used smokeless tobacco. She reports current alcohol use of about 14.0 standard drinks of alcohol per week. She reports that she does not use drugs.  Immunization History  Administered Date(s) Administered  . PFIZER(Purple Top)SARS-COV-2 Vaccination 02/19/2020, 03/11/2020  . Pneumococcal Conjugate-13 12/18/2019  . Td 04/26/2020   Tetanus: 04/2020 Pneumo 23: Today - patient left prior to receiving, plan with next OV Pneum 13: 11/2019 Influenza: did get 2021 at [facility] Shingrix: plans to get at pharmacy - plans to do cash pay Covid 19: 3/3, pfizer - has had booster   Colonoscopy 06/2019 Dr. Tarri Glenn, lymphocytic colitis, was recommended 5 year recall  EGD 06/2019  [facility] 06/2020 DEXA has never had - ordered to schedule with next mammogram   Last eye: Dr. Clydene Laming, last 2021, will schedule Last dental: Dr. Oren Binet, last 2022  MEDICARE WELLNESS OBJECTIVES: Physical activity: Current Exercise Habits: The patient does not participate in regular exercise at present, Exercise limited by: None identified Cardiac risk factors: Cardiac Risk Factors include: advanced age (>56men, >87  women);dyslipidemia;hypertension;sedentary lifestyle;smoking/ tobacco exposure Depression/mood screen:   Depression screen [facility] 2/9 05/14/2021  Decreased Interest 0  Down, Depressed, Hopeless 0  PHQ - 2 Score 0  Altered sleeping -  Tired, decreased energy -  Change in appetite -  Feeling bad or failure about yourself  -  Trouble concentrating -  Moving slowly or fidgety/restless -  Suicidal thoughts -  PHQ-9 Score -  Difficult doing work/chores -    ADLs:  In your present state of health, do you have any difficulty performing the following activities: 05/14/2021  Hearing? N  Vision? N  Difficulty concentrating or making decisions? Y  Walking or climbing stairs? N  Dressing or bathing? N  Doing errands, shopping? Y  Comment family drives  Conservation officer, nature and eating ? N  Using the Toilet? N  In the past six months, have you accidently leaked urine? Y  Comment improved with reduced wine intake  Do you have problems with loss of bowel control? N  Managing your Medications? Y  Comment family assists  Managing your Finances? Y  Housekeeping or managing your Housekeeping? Y  Comment family assists  Some  recent data might be hidden     Cognitive Testing  Alert? Yes  Normal Appearance?Yes  Oriented to person? Yes  Place? Yes   Time? Yes  Recall of three objects?  2/3  Can perform simple calculations? Yes  Displays appropriate judgment?Yes  Can read the correct time from a watch face?Yes  MMSE - Mini Mental State Exam 05/14/2021 04/22/2020 02/06/2020  Orientation to time 5 5 3   Orientation to Place 5 5 4   Registration 3 3 3   Attention/ Calculation 5 5 4   Recall 2 3 3   Language- name 2 objects 2 2 2   Language- repeat 1 1 1   Language- follow 3 step command 3 3 3   Language- read & follow direction 1 1 1   Write a sentence 1 1 1   Copy design 1 1 1   Total score 29 30 26     EOL planning: Does Patient Have a Medical Advance Directive?: Yes Type of Advance Directive: [place] will Does patient want to make changes to medical advance directive?: No - Patient declined Copy of [place] in Chart?: No - copy requested   Review of Systems: Review of Systems  Constitutional: Negative.  Negative for malaise/fatigue and weight loss.  HENT: Negative.  Negative for hearing loss and tinnitus.   Eyes: Negative.  Negative for blurred vision and double vision.  Respiratory: Negative.  Negative for cough, shortness of breath and wheezing.   Cardiovascular: Negative.  Negative for chest pain, palpitations, orthopnea, claudication and leg swelling.  Gastrointestinal: Negative.  Negative for abdominal pain, blood in stool, constipation, diarrhea, heartburn, melena, nausea and vomiting.  Genitourinary: Negative.   Musculoskeletal: Positive for falls. Negative for joint pain and myalgias.  Skin: Negative.  Negative for rash.  Neurological: Negative.  Negative for dizziness, tingling, sensory change, weakness and headaches.  Endo/Heme/Allergies: Negative.  Negative for polydipsia.  Psychiatric/Behavioral: Positive for memory loss (frequently asks the same questions). Negative for depression, substance abuse (only 1 drink/night) and suicidal ideas. The patient is not nervous/anxious and does not have insomnia.   All other systems reviewed and are negative.   Physical Exam: Estimated body mass index is 22.32 kg/m as calculated from the following:   Height as of this encounter: 5\' 3"  (1.6 m).   Weight as of this encounter: 126 lb (57.2 kg). BP 104/64   Pulse 76   Temp (!) 97.5 F (36.4 C)   Ht 5\' 3"  (1.6 m)   Wt 126 lb (57.2 kg)   SpO2 95%   BMI 22.32 kg/m  General Appearance: elderly frail appearing female in no apparent distress Eyes: PERRLA, EOMs, conjunctiva no swelling or erythema Sinuses: No Frontal/maxillary tenderness ENT/Mouth: Ext aud canals clear, normal light reflex with TMs without erythema, bulging.  No erythema,  swelling, or exudate on post pharynx. Tonsils not swollen or erythematous. Hearing good Neck: Supple, thyroid normal. No bruits  Respiratory: Respiratory effort normal, BS equal bilaterally without rales, rhonchi, wheezing or stridor.  Cardio: RRR with 2/6 systolic murmur without rubs or gallops.  Chest: symmetric, with normal excursions and percussion.  Abdomen: Soft, nontender, no guarding, rebound, hernias, masses, or organomegaly.  Lymphatics: Non tender without lymphadenopathy.  Musculoskeletal: Full ROM all peripheral extremities, good grip strength, non-antalgic, unsteady gait.  Skin: Warm, dry; fragile without rashes, lesions; see photo for R shin wound, mildly erythematous, mild swelling, dry yellow discharge, expresses serous discharge only, no foul odor Neuro: Cranial nerves intact,,no cog wheeling, Normal muscle tone, wide based  stance,  Neg pronator drift Psych: Awake and oriented X 3, normal affect, Insight and Judgment appropriate       Medicare Attestation I have personally reviewed: The patient's medical and social history Their use of alcohol, tobacco or illicit drugs Their current medications and supplements The patient's functional ability including ADLs,fall risks, home safety risks, cognitive, and hearing and visual impairment Diet and physical activities Evidence for depression or mood disorders  The patient's weight, height, BMI, and visual acuity have been recorded in the chart.  I have made referrals, counseling, and provided education to the patient based on review of the above and I have provided the patient with a written personalized care plan for preventive services.     Gorden Harms Jie Stickels 12:32 PM [facility]

## 2021-05-14 ENCOUNTER — Ambulatory Visit (INDEPENDENT_AMBULATORY_CARE_PROVIDER_SITE_OTHER): Payer: Medicare Other | Admitting: Adult Health

## 2021-05-14 ENCOUNTER — Other Ambulatory Visit: Payer: Self-pay

## 2021-05-14 ENCOUNTER — Encounter: Payer: Self-pay | Admitting: Adult Health

## 2021-05-14 VITALS — BP 104/64 | HR 76 | Temp 97.5°F | Ht 63.0 in | Wt 126.0 lb

## 2021-05-14 DIAGNOSIS — E538 Deficiency of other specified B group vitamins: Secondary | ICD-10-CM

## 2021-05-14 DIAGNOSIS — Z0001 Encounter for general adult medical examination with abnormal findings: Secondary | ICD-10-CM | POA: Diagnosis not present

## 2021-05-14 DIAGNOSIS — Z952 Presence of prosthetic heart valve: Secondary | ICD-10-CM

## 2021-05-14 DIAGNOSIS — I700 Atherosclerosis of aorta: Secondary | ICD-10-CM

## 2021-05-14 DIAGNOSIS — E519 Thiamine deficiency, unspecified: Secondary | ICD-10-CM

## 2021-05-14 DIAGNOSIS — F3341 Major depressive disorder, recurrent, in partial remission: Secondary | ICD-10-CM

## 2021-05-14 DIAGNOSIS — F325 Major depressive disorder, single episode, in full remission: Secondary | ICD-10-CM

## 2021-05-14 DIAGNOSIS — J439 Emphysema, unspecified: Secondary | ICD-10-CM | POA: Diagnosis not present

## 2021-05-14 DIAGNOSIS — Z0000 Encounter for general adult medical examination without abnormal findings: Secondary | ICD-10-CM

## 2021-05-14 DIAGNOSIS — Z79899 Other long term (current) drug therapy: Secondary | ICD-10-CM

## 2021-05-14 DIAGNOSIS — E782 Mixed hyperlipidemia: Secondary | ICD-10-CM

## 2021-05-14 DIAGNOSIS — L089 Local infection of the skin and subcutaneous tissue, unspecified: Secondary | ICD-10-CM | POA: Insufficient documentation

## 2021-05-14 DIAGNOSIS — T148XXA Other injury of unspecified body region, initial encounter: Secondary | ICD-10-CM

## 2021-05-14 DIAGNOSIS — D692 Other nonthrombocytopenic purpura: Secondary | ICD-10-CM

## 2021-05-14 DIAGNOSIS — R6889 Other general symptoms and signs: Secondary | ICD-10-CM

## 2021-05-14 DIAGNOSIS — E2839 Other primary ovarian failure: Secondary | ICD-10-CM

## 2021-05-14 DIAGNOSIS — R918 Other nonspecific abnormal finding of lung field: Secondary | ICD-10-CM

## 2021-05-14 DIAGNOSIS — Z9181 History of falling: Secondary | ICD-10-CM

## 2021-05-14 DIAGNOSIS — Z6821 Body mass index (BMI) 21.0-21.9, adult: Secondary | ICD-10-CM

## 2021-05-14 DIAGNOSIS — F1019 Alcohol abuse with unspecified alcohol-induced disorder: Secondary | ICD-10-CM

## 2021-05-14 DIAGNOSIS — E559 Vitamin D deficiency, unspecified: Secondary | ICD-10-CM | POA: Diagnosis not present

## 2021-05-14 DIAGNOSIS — R413 Other amnesia: Secondary | ICD-10-CM

## 2021-05-14 MED ORDER — DOXYCYCLINE HYCLATE 100 MG PO CAPS: ORAL_CAPSULE | ORAL | 1 refills | Status: DC

## 2021-05-14 NOTE — Patient Instructions (Signed)
     Connie Alvarado , Thank you for taking time to come for your Medicare Wellness Visit. I appreciate your ongoing commitment to your health goals. Please review the following plan we discussed and let me know if I can assist you in the future.   These are the goals we discussed: Goals    . LIFESTYLE - DECREASE FALLS RISK     Decrease alcohol and controlled medication use.        This is a list of the screening recommended for you and due dates:  Health Maintenance  Topic Date Due  . DEXA scan (bone density measurement)  Never done  . Pneumonia vaccines (2 of 2 - PPSV23) 12/17/2020  . Flu Shot  07/27/2021  . Tetanus Vaccine  04/26/2030  . COVID-19 Vaccine  Completed  . HPV Vaccine  Aged Out   Dr. Leta Baptist for memory  [phone]    HOW TO [place]  7 a.m.-6:30 p.m., Monday 7 a.m.-5 p.m., Tuesday-Friday Schedule an appointment by calling [phone].

## 2021-05-15 LAB — LIPID PANEL
Cholesterol: 220 mg/dL — ABNORMAL HIGH (ref <200)
HDL: 75 mg/dL (ref >=50)
LDL Cholesterol (Calc): 120 mg/dL (calc) — ABNORMAL HIGH
Non-HDL Cholesterol (Calc): 145 mg/dL (calc) — ABNORMAL HIGH (ref <130)
Total CHOL/HDL Ratio: 2.9 (calc) (ref <5.0)
Triglycerides: 142 mg/dL (ref <150)

## 2021-05-15 LAB — COMPLETE METABOLIC PANEL WITH GFR
AG Ratio: 1.5 (calc) (ref 1.0–2.5)
ALT: 17 U/L (ref 6–29)
AST: 22 U/L (ref 10–35)
Albumin: 3.8 g/dL (ref 3.6–5.1)
Alkaline phosphatase (APISO): 61 U/L (ref 37–153)
BUN/Creatinine Ratio: 30 (calc) — ABNORMAL HIGH (ref 6–22)
BUN: 17 mg/dL (ref 7–25)
CO2: 28 mmol/L (ref 20–32)
Calcium: 9.5 mg/dL (ref 8.6–10.4)
Chloride: 107 mmol/L (ref 98–110)
Creat: 0.56 mg/dL — ABNORMAL LOW (ref 0.60–0.88)
GFR, Est African American: 102 mL/min/{1.73_m2} (ref >=60)
GFR, Est Non African American: 88 mL/min/{1.73_m2} (ref >=60)
Globulin: 2.6 g/dL (calc) (ref 1.9–3.7)
Glucose, Bld: 88 mg/dL (ref 65–99)
Potassium: 4.5 mmol/L (ref 3.5–5.3)
Sodium: 143 mmol/L (ref 135–146)
Total Bilirubin: 1 mg/dL (ref 0.2–1.2)
Total Protein: 6.4 g/dL (ref 6.1–8.1)

## 2021-05-15 LAB — CBC WITH DIFFERENTIAL/PLATELET
Absolute Monocytes: 639 cells/uL (ref 200–950)
Basophils Absolute: 39 cells/uL (ref 0–200)
Basophils Relative: 0.5 %
Eosinophils Absolute: 31 cells/uL (ref 15–500)
Eosinophils Relative: 0.4 %
HCT: 40.8 % (ref 35.0–45.0)
Hemoglobin: 13.3 g/dL (ref 11.7–15.5)
Lymphs Abs: 924 cells/uL (ref 850–3900)
MCH: 31.1 pg (ref 27.0–33.0)
MCHC: 32.6 g/dL (ref 32.0–36.0)
MCV: 95.6 fL (ref 80.0–100.0)
MPV: 12.5 fL (ref 7.5–12.5)
Monocytes Relative: 8.3 %
Neutro Abs: 6068 cells/uL (ref 1500–7800)
Neutrophils Relative %: 78.8 %
Platelets: 137 10*3/uL — ABNORMAL LOW (ref 140–400)
RBC: 4.27 10*6/uL (ref 3.80–5.10)
RDW: 13.1 % (ref 11.0–15.0)
Total Lymphocyte: 12 %
WBC: 7.7 10*3/uL (ref 3.8–10.8)

## 2021-05-15 LAB — MAGNESIUM: Magnesium: 2.1 mg/dL (ref 1.5–2.5)

## 2021-05-15 LAB — VITAMIN D 25 HYDROXY (VIT D DEFICIENCY, FRACTURES): Vit D, 25-Hydroxy: 53 ng/mL (ref 30–100)

## 2021-05-15 LAB — TSH: TSH: 0.9 mIU/L (ref 0.40–4.50)

## 2021-05-20 ENCOUNTER — Ambulatory Visit (INDEPENDENT_AMBULATORY_CARE_PROVIDER_SITE_OTHER): Payer: Medicare Other | Admitting: Internal Medicine

## 2021-05-20 ENCOUNTER — Other Ambulatory Visit: Payer: Self-pay

## 2021-05-20 VITALS — BP 130/90 | HR 85 | Temp 97.9°F | Resp 18 | Ht 63.0 in | Wt 125.2 lb

## 2021-05-20 DIAGNOSIS — S81811D Laceration without foreign body, right lower leg, subsequent encounter: Secondary | ICD-10-CM

## 2021-05-20 DIAGNOSIS — D692 Other nonthrombocytopenic purpura: Secondary | ICD-10-CM

## 2021-05-20 MED ORDER — DOXYCYCLINE HYCLATE 100 MG PO CAPS: ORAL_CAPSULE | ORAL | 1 refills | Status: DC

## 2021-05-20 NOTE — Progress Notes (Signed)
    Future Appointments  Date Time Provider [place]  05/20/2021  3:30 PM Unk Pinto, MD [facility] None  08/25/2021  3:00 PM [name], NP [facility] None  05/17/2022 11:00 AM [name], NP [facility] None    History of Present Illness:    Patient is an 80 yo   Seen in the ER on 04/10/2021 after a fall with a deep skin tear /skin flap  of the shin of the right LE treated initially from the ER with Keflex. Then switched to Doxycycline on 05/14/2021   Medications .  Budesonide ER 9 MG TB24, Take 1 tablet  daily. Marland Kitchen  atorvastatin 80 MG tablet, TAKE 1 TABLET  DAILY  .  BENADRYL 25 MG tablet, Take 25 mg  every 6 hours as needed. Marland Kitchen  acetaminophen  325 MG tablet, Take 650 mg  every 6  hours as needed. Marland Kitchen  amoxicillin  500 MG c, Take 2 grams  before a dental visit .  PEPTO BISMOL susp, Take 30 mLs  every 6  hours as needed. .  doxycycline100 MG capsule, Take 1 capsule twice daily with food .  vitamin C  500 MG tablet, Take  daily. Marland Kitchen  VITAMIN D , Take 1 tablet  daily. .  Zinc-Vitamin C, Use as directed in the mouth   Problem list She has Alcohol abuse with alcohol-induced disorder ([place]); Medication management; Mixed hyperlipidemia; H/O prosthetic aortic valve replacement; B12 deficiency; Vitamin D deficiency; Major depression in complete remission ([place]); Lung nodules; Lymphocytic colitis; Senile purpura ([place]); Aortic atherosclerosis ([place]); Emphysema of lung ([place]); Memory problem; Thiamine deficiency; At high risk for falls; and Infected wound on their problem list.   Observations/Objective:  BP 130/90   Pulse 85   Temp 97.9 F (36.6 C)   Resp 18   Ht 5\' 3"  (1.6 m)   Wt 125 lb 3.2 oz (56.8 kg)   SpO2 99%   BMI 22.18 kg/m   HEENT - WNL. Neck - supple.  Chest - Clear equal BS. Cor - Nl HS. RRR w/o sig MGR. PP 1(+). No edema. MS- FROM w/o deformities.  Gait Nl. Neuro -  Nl w/o focal abnormalities. Skin - There is a 3" x 4 " denuded area of the Right anterior  mid shin w/o  signs of cellulitis or lymphangitis.  Also note few areas of senile ecchymoses of the forearms.   Assessment and Plan:  1. Skin tear of right lower leg without complication  - Nephew instructed in making a poultice of Betadine - Sugar and  was applied to the wound and non-occlusive dressing also applied.  Instructed in wound care with daily soap & water and hydrogen peroxide and the application of the Betadine-Sugar compound. Advised nephew that this will be a slow process of healing.   2. Senile purpura ([place])   Follow Up Instructions:        I discussed the assessment and treatment plan with the patient. The patient was provided an opportunity to ask questions and all were answered. The patient agreed with the plan and demonstrated an understanding of the instructions.       The patient was advised to call back or seek an in-person evaluation if the symptoms worsen or if the condition fails to improve as anticipated.   Kirtland Bouchard, MD

## 2021-05-23 ENCOUNTER — Other Ambulatory Visit: Payer: Self-pay | Admitting: Gastroenterology

## 2021-05-23 DIAGNOSIS — K52832 Lymphocytic colitis: Secondary | ICD-10-CM

## 2021-05-23 DIAGNOSIS — R197 Diarrhea, unspecified: Secondary | ICD-10-CM

## 2021-05-25 ENCOUNTER — Encounter: Payer: Self-pay | Admitting: Internal Medicine

## 2021-06-15 ENCOUNTER — Ambulatory Visit (INDEPENDENT_AMBULATORY_CARE_PROVIDER_SITE_OTHER): Payer: Medicare Other | Admitting: Adult Health

## 2021-06-15 ENCOUNTER — Encounter: Payer: Self-pay | Admitting: Adult Health

## 2021-06-15 ENCOUNTER — Other Ambulatory Visit: Payer: Self-pay

## 2021-06-15 ENCOUNTER — Other Ambulatory Visit: Payer: Self-pay | Admitting: Adult Health

## 2021-06-15 VITALS — BP 124/76 | HR 76 | Temp 97.3°F | Wt 125.0 lb

## 2021-06-15 DIAGNOSIS — S81811D Laceration without foreign body, right lower leg, subsequent encounter: Secondary | ICD-10-CM

## 2021-06-15 NOTE — Progress Notes (Signed)
Assessment and Plan:  Connie Alvarado was seen today for laceration.  Diagnoses and all orders for this visit:  Skin tear of right lower leg without complication, subsequent encounter Well healed R shin wound; continue current plan Continue vitamin C, zinc, vitamin D supplements;  Daughter is monitoring; follow up only if any concerns with worsening   Further disposition pending results of labs. Discussed med's effects and SE's.   Over 15 minutes of exam, counseling, chart review, and critical decision making was performed.   Future Appointments  Date Time Provider [place]  06/15/2021  1:45 PM [name], NP [facility] None  08/25/2021  3:00 PM [name], NP [facility] None  05/17/2022 11:00 AM [name], NP [facility] None    ------------------------------------------------------------------------------------------------------------------   HPI BP 124/76   Pulse 76   Temp (!) 97.3 F (36.3 C)   Wt 125 lb (56.7 kg)   SpO2 96%   BMI 22.14 kg/m   79 y.o.female with alcohol abuse disorder with some memory concerns presents for ED follow up for R shin skin tear accompanied by her daughter. She was seen 04/10/2021 for skin tear of RLL following mechanical fall. Secured by steri strips and was placed on 500 mg TID keflex, she was having some purulent discharge, was switched to doxycycline 100 mg BID and presents for follow up accompanied by her daughter.    Past Medical History:  Diagnosis Date   Anxiety    Depression    Hyperlipidemia    Lymphocytic colitis    Malaria    as a child   Memory loss      Allergies  Allergen Reactions   Penicillins     Current Outpatient Medications on File Prior to Visit  Medication Sig   acetaminophen (TYLENOL) 325 MG tablet Take 650 mg by mouth every 6 (six) hours as needed.   amoxicillin (AMOXIL) 500 MG capsule Take 2 grams (4 pills) before a dental visit   ampicillin (PRINCIPEN) 500 MG capsule TAKE 4 CAPSULES BY MOUTH  BEFORE EACH OF THE THREE PROCEDURE   atorvastatin (LIPITOR) 80 MG tablet TAKE 1 TABLET BY MOUTH DAILY FOR CHOLESTEROL   bismuth subsalicylate (PEPTO BISMOL) 262 MG/15ML suspension Take 30 mLs by mouth every 6 (six) hours as needed.   Budesonide ER 9 MG TB24 TAKE 1 TABLET BY MOUTH DAILY   doxycycline (VIBRAMYCIN) 100 MG capsule Take  1 capsule  1 x /day  with food  For Infection   Ferrous Sulfate (IRON) 325 (65 Fe) MG TABS Take by mouth daily.   vitamin C (ASCORBIC ACID) 500 MG tablet Take 500 mg by mouth daily.   VITAMIN D PO Take 1 tablet by mouth daily.   Zinc-Vitamin C (ZINC-A-COLD/VITAMIN C MT) Use as directed in the mouth or throat.   diphenhydrAMINE (BENADRYL) 25 MG tablet Take 25 mg by mouth every 6 (six) hours as needed. (Patient not taking: Reported on 06/15/2021)   No current facility-administered medications on file prior to visit.    ROS: all negative except above.   Physical Exam:  BP 124/76   Pulse 76   Temp (!) 97.3 F (36.3 C)   Wt 125 lb (56.7 kg)   SpO2 96%   BMI 22.14 kg/m   General Appearance: Well nourished, in no apparent distress. Eyes: PERRL, conjunctiva no swelling or erythema ENT/Mouth: Mask in place; Hearing normal.  Neck: Supple Respiratory: Respiratory effort normal, BS equal bilaterally without rales, rhonchi, wheezing or stridor.  Cardio: RRR with no MRGs. Brisk  peripheral pulses without edema.  Abdomen: Soft, + BS.  Non tender Musculoskeletal: No obvious deformity, slow steady gait Skin: Warm, dry; thin/fragile, extensive extremity ecchymoses; R shin wound with scabbed edges, no erythema, swelling or discharge  Neuro: Normal muscle tone, Sensation intact. Repeats similar questions,  Psych: Awake and oriented X 3, normal affect, Insight and Judgment fair      Izora Ribas, NP 1:42 PM [facility]

## 2021-08-03 ENCOUNTER — Ambulatory Visit (INDEPENDENT_AMBULATORY_CARE_PROVIDER_SITE_OTHER): Payer: Medicare Other | Admitting: Adult Health

## 2021-08-03 ENCOUNTER — Encounter: Payer: Self-pay | Admitting: Adult Health

## 2021-08-03 ENCOUNTER — Other Ambulatory Visit: Payer: Self-pay

## 2021-08-03 VITALS — BP 110/66 | HR 89 | Temp 97.5°F | Wt 127.0 lb

## 2021-08-03 DIAGNOSIS — S61411D Laceration without foreign body of right hand, subsequent encounter: Secondary | ICD-10-CM

## 2021-08-03 DIAGNOSIS — R413 Other amnesia: Secondary | ICD-10-CM | POA: Diagnosis not present

## 2021-08-03 DIAGNOSIS — Z23 Encounter for immunization: Secondary | ICD-10-CM

## 2021-08-03 NOTE — Progress Notes (Signed)
Assessment and Plan:  Connie Alvarado was seen today for laceration.  Diagnoses and all orders for this visit:  Skin tear of right hand without complication, subsequent encounter Continue doxycycline, monitoring and dressing changes reviewed with daughter; follow up 7-10 days; sooner if any concerns  Memory changes Unremarkable exam, but will check basic labs, r/o UTI Has had recent TSH, B12 was normal -     CBC with Differential/Platelet -     COMPLETE METABOLIC PANEL WITH GFR -     Urinalysis w microscopic + reflex cultur  Need for 23-polyvalent pneumococcal polysaccharide vaccine -     Pneumococcal polysaccharide vaccine 23-valent greater than or equal to 2yo subcutaneous/IM  Further disposition pending results of labs. Discussed med's effects and SE's.   Over 30 minutes of exam, counseling, chart review, and critical decision making was performed.   Future Appointments  Date Time Provider [place]  08/25/2021  3:00 PM Connie Comber, NP [facility] None  12/01/2021  1:00 PM [facility] DX DEXA 1 [facility]  05/17/2022 11:00 AM Connie Comber, NP [facility] None    ------------------------------------------------------------------------------------------------------------------   HPI BP 110/66   Pulse 89   Temp (!) 97.5 F (36.4 C)   Wt 127 lb (57.6 kg)   SpO2 97%   BMI 22.50 kg/m  80 y.o.female presents accompanied by her daughter for 3 days follow up after UC visit for R hand skin tear. She has steri strips in place. She was placed on 10 day course of doxycycline. No pain, erythema or discharge noted. Daughter is changing dressing daily.   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 Daughter notes patient seems suddenly more forgetful in the last week. Denies other changes, falls, HA, slurring, weakness, UTI sx, GI sx, URI sx, fatigue,  fever/chills.    Past Medical History:  Diagnosis Date   Anxiety    Depression    Hyperlipidemia    Lymphocytic colitis    Malaria    as a child   Memory loss      Allergies  Allergen Reactions   Penicillins     Current Outpatient Medications on File Prior to Visit  Medication Sig   acetaminophen (TYLENOL) 325 MG tablet Take 650 mg by mouth every 6 (six) hours as needed.   amoxicillin (AMOXIL) 500 MG capsule Take 2 grams (4 pills) before a dental visit   ampicillin (PRINCIPEN) 500 MG capsule TAKE 4 CAPSULES BY MOUTH BEFORE EACH OF THE THREE PROCEDURE   atorvastatin (LIPITOR) 80 MG tablet TAKE 1 TABLET BY MOUTH DAILY FOR CHOLESTEROL   bismuth subsalicylate (PEPTO BISMOL) 262 MG/15ML suspension Take 30 mLs by mouth every 6 (six) hours as needed.   Budesonide ER 9 MG TB24 TAKE 1 TABLET BY MOUTH DAILY   diphenhydrAMINE (BENADRYL) 25 MG tablet Take 25 mg by mouth every 6 (six) hours as needed.   doxycycline (VIBRAMYCIN) 100 MG capsule Take  1 capsule  1 x /day  with food  For Infection   Ferrous Sulfate (IRON) 325 (65 Fe) MG TABS Take by mouth daily.   vitamin C (ASCORBIC ACID) 500 MG tablet Take 500 mg by mouth daily.   VITAMIN D PO Take 1 tablet by mouth daily.   Zinc-Vitamin C (ZINC-A-COLD/VITAMIN C MT) Use as directed in the mouth or throat.   No current facility-administered medications on file prior to visit.    ROS: all  negative except above.   Physical Exam:  BP 110/66   Pulse 89   Temp (!) 97.5 F (36.4 C)   Wt 127 lb (57.6 kg)   SpO2 97%   BMI 22.50 kg/m   General Appearance: Well nourished, well dressed elder female in no apparent distress. Eyes: PERRLA, EOMs, conjunctiva no swelling or erythema ENT/Mouth: Ext aud canals clear, TMs without erythema, bulging. No erythema, swelling, or exudate on post pharynx.  Tonsils not swollen or erythematous. Hearing normal.  Neck: Supple, thyroid normal.  Respiratory: Respiratory effort normal, BS equal bilaterally  without rales, rhonchi, wheezing or stridor.  Cardio: RRR with no MRGs. Brisk peripheral pulses without edema.  Abdomen: Soft, + BS.  Non tender, no guarding, rebound, hernias, masses. Lymphatics: Non tender without lymphadenopathy.  Musculoskeletal: Full ROM, 5/5 strength, normal gait.  Skin: Warm, dry; she has well approximated skin tear R hand/wrist with steri strips; no erythema, odor, discharge. Scattered ecchymosis bil upper and lower extremities.  Neuro: Cranial nerves intact. Normal muscle tone, no cerebellar symptoms. Sensation intact.  Psych: Awake and oriented X 3, normal affect, Insight and Judgment poor, similar to recent baseline.     Izora Ribas, NP 4:53 PM [facility]

## 2021-08-03 NOTE — Patient Instructions (Signed)
Continue doxycycline twice a day until you run out  Keep wound covered with gauze dressing for 1 week (until 08/10/2021)  Ok to rinse in warm running water/soapy water between dressing changes  Watch for any NEW redness, tenderness, pain  I will see you back in 7-10 days to make sure everything looks ok  Continue with vitamin C, zinc, vitamin D -  helps with wound healing  Eat plenty of protein - lean protein, greek yogurt, beans, nut butters Eat plenty of fresh fruits and veggies

## 2021-08-04 ENCOUNTER — Telehealth: Payer: Self-pay | Admitting: Diagnostic Neuroimaging

## 2021-08-04 LAB — COMPLETE METABOLIC PANEL WITH GFR
AG Ratio: 1.5 (calc) (ref 1.0–2.5)
ALT: 19 U/L (ref 6–29)
AST: 20 U/L (ref 10–35)
Albumin: 3.8 g/dL (ref 3.6–5.1)
Alkaline phosphatase (APISO): 61 U/L (ref 37–153)
BUN: 20 mg/dL (ref 7–25)
CO2: 29 mmol/L (ref 20–32)
Calcium: 9.8 mg/dL (ref 8.6–10.4)
Chloride: 105 mmol/L (ref 98–110)
Creat: 0.68 mg/dL (ref 0.60–0.95)
Globulin: 2.5 g/dL (calc) (ref 1.9–3.7)
Glucose, Bld: 102 mg/dL — ABNORMAL HIGH (ref 65–99)
Potassium: 4.2 mmol/L (ref 3.5–5.3)
Sodium: 143 mmol/L (ref 135–146)
Total Bilirubin: 0.7 mg/dL (ref 0.2–1.2)
Total Protein: 6.3 g/dL (ref 6.1–8.1)
eGFR: 88 mL/min/{1.73_m2} (ref >=60)

## 2021-08-04 LAB — URINALYSIS W MICROSCOPIC + REFLEX CULTURE
Bacteria, UA: NONE SEEN /HPF
Bilirubin Urine: NEGATIVE
Glucose, UA: NEGATIVE
Hgb urine dipstick: NEGATIVE
Hyaline Cast: NONE SEEN /LPF
Ketones, ur: NEGATIVE
Leukocyte Esterase: NEGATIVE
Nitrites, Initial: NEGATIVE
Protein, ur: NEGATIVE
RBC / HPF: NONE SEEN /HPF (ref 0–2)
Specific Gravity, Urine: 1.023 (ref 1.001–1.035)
pH: 6 (ref 5.0–8.0)

## 2021-08-04 LAB — CBC WITH DIFFERENTIAL/PLATELET
Absolute Monocytes: 606 cells/uL (ref 200–950)
Basophils Absolute: 37 cells/uL (ref 0–200)
Basophils Relative: 0.5 %
Eosinophils Absolute: 51 cells/uL (ref 15–500)
Eosinophils Relative: 0.7 %
HCT: 39.5 % (ref 35.0–45.0)
Hemoglobin: 13.3 g/dL (ref 11.7–15.5)
Lymphs Abs: 971 cells/uL (ref 850–3900)
MCH: 32 pg (ref 27.0–33.0)
MCHC: 33.7 g/dL (ref 32.0–36.0)
MCV: 95 fL (ref 80.0–100.0)
MPV: 12.8 fL — ABNORMAL HIGH (ref 7.5–12.5)
Monocytes Relative: 8.3 %
Neutro Abs: 5636 cells/uL (ref 1500–7800)
Neutrophils Relative %: 77.2 %
Platelets: 153 10*3/uL (ref 140–400)
RBC: 4.16 10*6/uL (ref 3.80–5.10)
RDW: 12.7 % (ref 11.0–15.0)
Total Lymphocyte: 13.3 %
WBC: 7.3 10*3/uL (ref 3.8–10.8)

## 2021-08-04 LAB — NO CULTURE INDICATED

## 2021-08-04 NOTE — Telephone Encounter (Signed)
LVM for Lauren requesting  call back.

## 2021-08-04 NOTE — Telephone Encounter (Signed)
Daughter called back and we scheduled for sooner FU. I advised arrive 30 minutes early, bring current insurance cards and med list. Lauren verbalized understanding, appreciation.

## 2021-08-04 NOTE — Telephone Encounter (Signed)
Pt's daughter, Neita Carp (on DPR) called, week ago memory loss has worsened. Have seen PCP to do a urinalysis to make sure she does not have a UTI, there were no signs of a UTI.  Would like call from the nurse to discuss a sooner appt than September.

## 2021-08-25 ENCOUNTER — Ambulatory Visit (INDEPENDENT_AMBULATORY_CARE_PROVIDER_SITE_OTHER): Payer: Medicare Other | Admitting: Adult Health

## 2021-08-25 ENCOUNTER — Encounter: Payer: Self-pay | Admitting: Adult Health

## 2021-08-25 ENCOUNTER — Other Ambulatory Visit: Payer: Self-pay

## 2021-08-25 VITALS — BP 110/62 | HR 101 | Temp 97.7°F | Ht 63.0 in | Wt 125.2 lb

## 2021-08-25 DIAGNOSIS — Z1389 Encounter for screening for other disorder: Secondary | ICD-10-CM

## 2021-08-25 DIAGNOSIS — J439 Emphysema, unspecified: Secondary | ICD-10-CM

## 2021-08-25 DIAGNOSIS — F325 Major depressive disorder, single episode, in full remission: Secondary | ICD-10-CM

## 2021-08-25 DIAGNOSIS — Z136 Encounter for screening for cardiovascular disorders: Secondary | ICD-10-CM

## 2021-08-25 DIAGNOSIS — Z0001 Encounter for general adult medical examination with abnormal findings: Secondary | ICD-10-CM

## 2021-08-25 DIAGNOSIS — D692 Other nonthrombocytopenic purpura: Secondary | ICD-10-CM

## 2021-08-25 DIAGNOSIS — Z79899 Other long term (current) drug therapy: Secondary | ICD-10-CM

## 2021-08-25 DIAGNOSIS — K52832 Lymphocytic colitis: Secondary | ICD-10-CM

## 2021-08-25 DIAGNOSIS — E782 Mixed hyperlipidemia: Secondary | ICD-10-CM

## 2021-08-25 DIAGNOSIS — I700 Atherosclerosis of aorta: Secondary | ICD-10-CM

## 2021-08-25 DIAGNOSIS — E559 Vitamin D deficiency, unspecified: Secondary | ICD-10-CM

## 2021-08-25 DIAGNOSIS — R413 Other amnesia: Secondary | ICD-10-CM

## 2021-08-25 DIAGNOSIS — Z1329 Encounter for screening for other suspected endocrine disorder: Secondary | ICD-10-CM

## 2021-08-25 DIAGNOSIS — R7309 Other abnormal glucose: Secondary | ICD-10-CM

## 2021-08-25 DIAGNOSIS — Z952 Presence of prosthetic heart valve: Secondary | ICD-10-CM

## 2021-08-25 DIAGNOSIS — E519 Thiamine deficiency, unspecified: Secondary | ICD-10-CM

## 2021-08-25 DIAGNOSIS — F1019 Alcohol abuse with unspecified alcohol-induced disorder: Secondary | ICD-10-CM

## 2021-08-25 DIAGNOSIS — E538 Deficiency of other specified B group vitamins: Secondary | ICD-10-CM

## 2021-08-25 DIAGNOSIS — R918 Other nonspecific abnormal finding of lung field: Secondary | ICD-10-CM

## 2021-08-25 DIAGNOSIS — Z131 Encounter for screening for diabetes mellitus: Secondary | ICD-10-CM

## 2021-08-25 DIAGNOSIS — Z9181 History of falling: Secondary | ICD-10-CM

## 2021-08-25 NOTE — Patient Instructions (Addendum)
Connie Alvarado , Thank you for taking time to come for your Annual Wellness Visit. I appreciate your ongoing commitment to your health goals. Please review the following plan we discussed and let me know if I can assist you in the future.   These are the goals we discussed:  Goals      Reduce alcohol intake     Limit to max 1-2 drinks per night, less is better, continue to reduce         This is a list of the screening recommended for you and due dates:  Health Maintenance  Topic Date Due   Zoster (Shingles) Vaccine (1 of 2) Never done   DEXA scan (bone density measurement)  Never done   COVID-19 Vaccine (4 - Booster for Pfizer series) 02/24/2021   Flu Shot  09/26/2021*   Tetanus Vaccine  04/26/2030   Pneumonia vaccines  Completed   HPV Vaccine  Aged Out  *Topic was postponed. The date shown is not the original due date.     Know what a healthy weight is for you (roughly BMI <25) and aim to maintain this  Aim for 7+ servings of fruits and vegetables daily  65-80+ fluid ounces of water or unsweet tea for healthy kidneys  Limit to max 1 drink of alcohol per day; avoid smoking/tobacco  Limit animal fats in diet for cholesterol and heart health - choose grass fed whenever available  Avoid highly processed foods, and foods high in saturated/trans fats  Aim for low stress - take time to unwind and care for your mental health  Aim for 150 min of moderate intensity exercise weekly for heart health, and weights twice weekly for bone health  Aim for 7-9 hours of sleep daily     Alcohol Abuse and Nutrition Alcohol abuse is any pattern of alcohol consumption that harms your health, relationships, or work. Alcohol abuse can cause poor nutrition (malnutrition or malnourishment) and a lack of nutrients (nutrient deficiencies), which can lead to more health problems. Alcohol abuse brings malnutrition and nutrient deficiencies in two ways: It causes your liver to work abnormally. This  affects how your body divides (breaks down) and absorbs nutrients from food. It causes you to eat poorly. Many people who abuse alcohol do not eat enough carbohydrates, protein, fat, vitamins, and minerals. Nutrients that are commonly lacking (deficient) in people who abuse alcohol include: Vitamins. Vitamin A. This is needed for your vision, metabolism, and ability to fight off infections (immunity). B vitamins. These include folate, thiamine, and niacin. These are needed for new cell growth. Vitamin C. This plays an important role in wound healing, immunity, and helping your body to absorb iron. Vitamin D. This is necessary for your body to absorb and use calcium. It is produced by your liver, but you can also get it from food and from sun exposure. Minerals. Calcium. This is needed for healthy bones as well as heart and blood vessel (cardiovascular) function. Iron. This is important for blood, muscle, and nervous system functioning. Magnesium. This plays an important role in muscle and nerve function, and it helps to control blood sugar and blood pressure. Zinc. This is important for the normal functioning of your nervous system and digestive system (gastrointestinal tract). If you think that you have an alcohol dependency problem, or if it is hard to stop drinking because you feel sick or different when you do not use alcohol, talk with your health care provider or another health professional about whereto  get help. Nutrition is an essential factor in the therapy for alcohol abuse. Your health care provider or diet and nutrition specialist (dietitian) will work with you to design a plan that can help to restore nutrients toyour body and prevent the risk of complications. What is my plan? Your dietitian may develop a specific eating plan that is based on your condition and any other problems that you have. An eating plan will commonly include: A balanced diet. Grains: 6-8 oz (170-227 g) a day.  Examples of 1 oz of whole grains include 1 cup of whole-wheat cereal,  cup of brown rice, or 1 slice of whole-wheat bread. Vegetables: 2-3 cups a day. Examples of 1 cup of vegetables include 2 medium carrots, 1 large tomato, or 2 stalks of celery. Fruits: 1-2 cups a day. Examples of 1 cup of fruit include 1 large banana, 1 small apple, 8 large strawberries, or 1 large orange. Meat and other protein: 5-6 oz (142-170 g) a day. A cut of meat or fish that is the size of a deck of cards is about 3-4 oz. Foods that provide 1 oz of protein include 1 egg,  cup of nuts or seeds, or 1 tablespoon (16 g) of peanut butter. Dairy: 2-3 cups a day. Examples of 1 cup of dairy include 8 oz (230 mL) of milk, 8 oz (230 g) of yogurt, or 1 oz (44 g) of natural cheese. Vitamin and mineral supplements. What are tips for following this plan? Eat frequent meals and snacks. Try to eat 5-6 small meals each day. Take vitamin or mineral supplements as recommended by your dietitian. If you are malnourished or if your dietitian recommends it: You may follow a high-protein, high-calorie diet. This may include: 2,000-3,000 calories (kilocalories) a day. 70-100 g (grams) of protein a day. You may be directed to follow a diet that includes a complete nutritional supplement beverage. This can help to restore calories, protein, and vitamins to your body. Depending on your condition, you may be advised to consume this beverage instead of your meals or in addition to them. Certain medicines may cause changes in your appetite, taste, and weight. Work with your health care provider and dietitian to make any changes to your medicines and eating plan. If you are unable to take in enough food and calories by mouth, your health care provider may recommend a feeding tube. This tube delivers nutritional supplements directly to your stomach. Recommended foods Eat foods that are high in molecules that prevent oxygen from reacting with your  food (antioxidants). These foods include grapes, berries, nuts, green tea, and dark green or orange vegetables. Eating these can help to prevent some of the stress that is placed on your liver by consuming alcohol. Eat a variety of fresh fruits and vegetables each day. This will help you to get fiber and vitamins in your diet. Drink plenty of water and other clear fluids, such as apple juice and broth. Try to drink at least 48-64 oz (1.5-2 L) of water a day. Include foods fortified with vitamins and minerals in your diet. Commonly fortified foods include milk, orange juice, cereal, and bread. Eat a variety of foods that are high in omega-3 and omega-6 fatty acids. These include fish, nuts and seeds, and soybeans. These foods may help your liver to recover and may also stabilize your mood. If you are a vegetarian: Eat a variety of protein-rich foods. Pair whole grains with plant-based proteins at meals and snack time. For example, eat  rice with beans, put peanut butter on whole-grain toast, or eat oatmeal with sunflower seeds. The items listed above may not be a complete list of foods and beverages youcan eat. Contact a dietitian for more information. Foods to avoid Avoid foods and drinks that are high in fat and sugar. Sugary drinks, salty snacks, and candy contain empty calories. This means that they lack important nutrients such as protein, fiber, and vitamins. Avoid alcohol. This is the best way to avoid malnutrition due to alcohol abuse. If you must drink, drink measured amounts. Measured drinking means limiting your intake to no more than 1 drink a day for nonpregnant women and 2 drinks a day for men. One drink equals 12 oz (355 mL) of beer, 5 oz (148 mL) of wine, or 1 oz (44 mL) of hard liquor. Limit your intake of caffeine. Replace drinks like coffee and black tea with decaffeinated coffee and decaffeinated herbal tea. The items listed above may not be a complete list of foods and beverages  youshould avoid. Contact a dietitian for more information. Summary Alcohol abuse can cause poor nutrition (malnutrition or malnourishment) and a lack of nutrients (nutrient deficiencies), which can lead to more health problems. Common nutrient deficiencies include vitamin deficiencies (A, B, C, and D) and mineral deficiencies (calcium, iron, magnesium, and zinc). Nutrition is an essential factor in the therapy for alcohol abuse. Your health care provider and dietitian can help you to develop a specific eating plan that includes a balanced diet plus vitamin and mineral supplements. This information is not intended to replace advice given to you by your health care provider. Make sure you discuss any questions you have with your healthcare provider. Document Revised: 11/03/2020 Document Reviewed: 11/03/2020 [employer] Patient Education  2022 [employer].

## 2021-08-25 NOTE — Progress Notes (Signed)
CPE  Assessment and Plan:   Encounter for Annual Physical Exam with abnormal findings Due annually  Health Maintenance reviewed Healthy lifestyle reviewed and goals set DEXA with mammogram - in 11/2021  Atherosclerosis of aorta (Connie Alvarado) - CT 05/2020 Per CT 05/2020 Control blood pressure, cholesterol, glucose, increase exercise.   Depression, major, recurrent, in remission (Connie Alvarado) Doing well off of meds; reminded need to reduce alcohol  Lifestyle discussed: diet/exerise, sleep hygiene, stress management, hydration -     TSH  Alcohol abuse with alcohol-induced disorder (Connie Alvarado) Discussed goal to reduce back down to max 1/day, taper down slowly; Declines meds for this Avoid sedating medications  Senile Alvarado (Connie Alvarado) Discussed process, protect skin, sunscreen  B12 deficiency/ Thiamine def Stop drinking, continue B complex, recheck levels  Mixed hyperlipidemia check lipids decrease fatty foods increase activity.  -     Lipid panel  H/O prosthetic aortic valve replacement Continue follow up cardio, no concerns  Medication management -     CBC with Differential/Platelet -     COMPLETE METABOLIC PANEL WITH GFR -     Magnesium  Vitamin D deficiency -     VITAMIN D 25 Hydroxy (Vit-D Deficiency, Fractures)  Anemia, unspecified type -     CBC with Differential/Platelet  Emphysema (Connie Alvarado)- CT 05/2020 Per imaging, remote smoker, denies sx; monitor  Lung nodules 06/10/2020, had CT that showed stable 0.6cm left lower lobe Has been stable for 15 months, follow up was optional After discussion of risks and benefits patient and daughter declined further CT at this time  Lymphocytic colitis Stop drinking, improved with budesonide; continue to follow up GI  Memory loss Neuro Dr. Leta Alvarado is following 29/30 on MMSE, mood is improved,  Had persistent even with reduced alcohol, on B complex with improved labs, discussed again alcohol reduction, receptive to this  High fall  risk Discussed fall prevention, was referred for PT Discussed goal for reducing alcohol back down Fall prevention in home discussed   Orders Placed This Encounter  Procedures   CBC with Differential/Platelet   COMPLETE METABOLIC PANEL WITH GFR   Magnesium   Lipid panel   TSH   Hemoglobin A1c   VITAMIN D 25 Hydroxy (Vit-D Deficiency, Fractures)   Vitamin B12   Microalbumin / creatinine urine ratio   Urinalysis, Routine w reflex microscopic   Iron, TIBC and Ferritin Panel   Vitamin B1     Discussed med's effects and SE's. Screening labs and tests as requested with regular follow-up as recommended. Over 40 minutes of exam, counseling, chart review, and complex, high level critical decision making was performed this visit.   Future Appointments  Date Time Provider Connie Alvarado  08/26/2021  3:00 PM Connie Bombard, MD Connie Alvarado Alvarado  12/01/2021  1:00 PM Connie Alvarado  01/13/2022 11:30 AM Connie Pinto, MD Connie Alvarado  05/17/2022 11:00 AM Connie Comber, NP Connie Alvarado  08/26/2022  3:00 PM Connie Comber, NP Connie Alvarado     Plan:   During the course of the visit the patient was educated and counseled about appropriate screening and preventive services including:   Pneumococcal vaccine  Prevnar 13 Influenza vaccine Td vaccine Screening electrocardiogram Bone densitometry screening Colorectal cancer screening Diabetes screening Glaucoma screening Nutrition counseling  Advanced directives: requested   HPI  80 y.o. female  presents for CPE and follow up. She has Alcohol abuse with alcohol-induced disorder (Connie Alvarado); Medication management; Mixed hyperlipidemia; H/O prosthetic aortic valve replacement; B12 deficiency; Vitamin D deficiency;  Major depression in complete remission (Connie Alvarado); Lung nodules; Lymphocytic colitis; Senile Alvarado (Connie Alvarado); Aortic atherosclerosis (Connie Alvarado) - CT 05/2020; Emphysema of lung (Connie Alvarado) - CT 05/2020; Memory problem;  Thiamine deficiency; and At high risk for falls on their problem list.   She has history of anxiety, alcohol abuse, memory problems; her husband had kidney cancer and passed 2020. Moved in with her daughter, Connie Alvarado due to memory concerns. She was on numerous mood meds without benefit, stopped. Notes mood has since improved.   Daughter was limiting alcohol to 1/day but after she was out of town patient has been back to drinking 1 bottle of wine, is receptive to reducing back to 1-2/night. Memory continues to be an issue, frequently asks the same questions.   She has seen Dr. Kathlen Mody, normal MRI  Brain 04/2019. Last MMSE 29. 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   She also had a low B1 of 7 02/2019 and is on B complex, improved to 26 at recheck, but daughter notes this was when alcohol intake was down to 1/night.   She was found to have lymphocytic colitis on colonoscopy in 06/2019, is on budesonide and is following with GI Dr. Tarri Glenn, well controlled.  Had CT 06/10/2020 showing stable 6 mm left lower lobe nodule, recommedned 12 month follow up if high risk, CT also showed emphysematous changes and extensive aortic atherosclerosis. She is remote former smoker. Emphysema per CT 05/2020. Discussed extensively, declines specific follow up.   BMI is Body mass index is 22.18 kg/m., she has not been working on diet and exercise, has left more weak since stopping iron supplement. Plans to start walking with daughter.  Wt Readings from Last 3 Encounters:  08/25/21 125 lb 3.2 oz (56.8 kg)  08/03/21 127 lb (57.6 kg)  06/15/21 125 lb (56.7 kg)   Her blood pressure has been controlled at home, today their BP is BP: 110/62 She does not workout. She denies chest pain, shortness of breath, dizziness.   She has a history of hyperlipidemia on liptior and has history of bovine aortic valve  replacement x 2. Last TAVR in 2016, last echo was 05/2020, following with Dr. Sallyanne Kuster. Aortic atherosclerosis per CT 05/2020.    She is on cholesterol medication (atorvastatin 80 mg daily) and denies myalgias. Her cholesterol is not at goal. The cholesterol last visit was:   Lab Results  Component Value Date   CHOL 220 (H) 05/14/2021   HDL 75 05/14/2021   LDLCALC 120 (H) 05/14/2021   TRIG 142 05/14/2021   CHOLHDL 2.9 05/14/2021   Last A1C in the office was:  Lab Results  Component Value Date   HGBA1C 5.6 10/22/2019    Last GFR:  Lab Results  Component Value Date   GFRNONAA 88 05/14/2021   Patient is on Vitamin D supplement.   Lab Results  Component Value Date   VD25OH 46 05/14/2021     She is on B complex Lab Results  Component Value Date   VITAMINB12 590 11/04/2020   She has been off of iron supplement for several months Lab Results  Component Value Date   IRON 45 09/02/2020   TIBC 368 09/02/2020   FERRITIN 54 09/02/2020      Current Medications:   Current Outpatient Medications (Endocrine & Metabolic):    Budesonide ER 9 MG TB24, TAKE 1 TABLET BY MOUTH DAILY  Current Outpatient Medications (Cardiovascular):  atorvastatin (LIPITOR) 80 MG tablet, TAKE 1 TABLET BY MOUTH DAILY FOR CHOLESTEROL  Current Outpatient Medications (Respiratory):    diphenhydrAMINE (BENADRYL) 25 MG tablet, Take 25 mg by mouth every 6 (six) hours as needed.  Current Outpatient Medications (Analgesics):    acetaminophen (TYLENOL) 325 MG tablet, Take 650 mg by mouth every 6 (six) hours as needed.  Current Outpatient Medications (Hematological):    Cyanocobalamin (B-12 PO), Take by mouth daily.   Ferrous Sulfate (IRON) 325 (65 Fe) MG TABS, Take by mouth daily. (Patient not taking: Reported on 08/25/2021)  Current Outpatient Medications (Other):    amoxicillin (AMOXIL) 500 MG capsule, Take 2 grams (4 pills) before a dental visit   ampicillin (PRINCIPEN) 500 MG capsule, TAKE 4 CAPSULES  BY MOUTH BEFORE EACH OF THE THREE PROCEDURE   bismuth subsalicylate (PEPTO BISMOL) 262 MG/15ML suspension, Take 30 mLs by mouth every 6 (six) hours as needed.   VITAMIN D PO, Take 1 tablet by mouth daily.   Zinc-Vitamin C (ZINC-A-COLD/VITAMIN C MT), Use as directed in the mouth or throat.  Allergies:  Allergies  Allergen Reactions   Penicillins    Medical History:  She has Alcohol abuse with alcohol-induced disorder ([place]); Medication management; Mixed hyperlipidemia; H/O prosthetic aortic valve replacement; B12 deficiency; Vitamin D deficiency; Major depression in complete remission ([place]); Lung nodules; Lymphocytic colitis; Senile Alvarado ([place]); Aortic atherosclerosis ([place]) - CT 05/2020; Emphysema of lung ([place]) - CT 05/2020; Memory problem; Thiamine deficiency; and At high risk for falls on their problem list.   Patient Care Team: Connie Pinto, MD as PCP - General (Internal Medicine) Croitoru, Dani Gobble, MD as PCP - Cardiology (Cardiology) Connie Bombard, MD as Consulting Physician (Neurology)  Surgical History:  She has a past surgical history that includes Cardiac valuve replacement (1999) and Cardiac valuve replacement (2016). Family History:  Herfamily history includes Breast cancer in her mother; Hyperlipidemia in her mother; Hypertension in her mother; Kidney disease in her father; Parkinson's disease in her father; Parkinsonism in her father; Sleep apnea in her daughter. Social History:  She reports that she quit smoking about 42 years ago. Her smoking use included cigarettes. She has never used smokeless tobacco. She reports current alcohol use of about 28.0 standard drinks per week. She reports that she does not use drugs.  Immunization History  Administered Date(s) Administered   PFIZER(Purple Top)SARS-COV-2 Vaccination 02/19/2020, 03/11/2020, 11/26/2020   Pneumococcal Conjugate-13 12/18/2019   Pneumococcal Polysaccharide-23 08/03/2021   Td 04/26/2020   Tetanus:  04/2020 Pneumo 23: 07/2021 Pneum 13: 11/2019 Influenza: 2021 Shingrix: plans to get at pharmacy - plans to do cash pay Covid 19: 3/3, pfizer - has had booster   Colonoscopy 06/2019 Dr. Tarri Glenn, lymphocytic colitis, was recommended 5 year recall  EGD 06/2019  MGM 06/2020 - ? Has scheduled 11/2021 DEXA has never had - has scheduled 12/01/2021  Last eye: Dr. Clydene Laming, last 2021, will schedule Last dental: Dr. Oren Binet, last 2022    Review of Systems: Review of Systems  Constitutional: Negative.  Negative for malaise/fatigue and weight loss.  HENT: Negative.  Negative for hearing loss and tinnitus.   Eyes: Negative.  Negative for blurred vision and double vision.  Respiratory: Negative.  Negative for cough, shortness of breath and wheezing.   Cardiovascular: Negative.  Negative for chest pain, palpitations, orthopnea, claudication and leg swelling.  Gastrointestinal: Negative.  Negative for abdominal pain, blood in stool, constipation, diarrhea, heartburn, melena, nausea and vomiting.  Genitourinary: Negative.   Musculoskeletal:  Positive for falls. Negative  for joint pain and myalgias.  Skin: Negative.  Negative for rash.  Neurological: Negative.  Negative for dizziness, tingling, sensory change, weakness and headaches.  Endo/Heme/Allergies: Negative.  Negative for polydipsia.  Psychiatric/Behavioral:  Positive for memory loss (frequently asks the same questions) and substance abuse (back up to 1 bottle of wine/night). Negative for depression and suicidal ideas. The patient is not nervous/anxious and does not have insomnia.   All other systems reviewed and are negative.  Physical Exam: Estimated body mass index is 22.18 kg/m as calculated from the following:   Height as of this encounter: '5\' 3"'$  (1.6 m).   Weight as of this encounter: 125 lb 3.2 oz (56.8 kg). BP 110/62   Pulse (!) 101   Temp 97.7 F (36.5 C)   Ht '5\' 3"'$  (1.6 m)   Wt 125 lb 3.2 oz (56.8 kg)   SpO2 96%   BMI 22.18 kg/m   General Appearance: elderly frail appearing female in no apparent distress Eyes: PERRLA, EOMs, conjunctiva no swelling or erythema Sinuses: No Frontal/maxillary tenderness ENT/Mouth: Ext aud canals clear, normal light reflex with TMs without erythema, bulging.  No erythema, swelling, or exudate on post pharynx. Tonsils not swollen or erythematous. Hearing is fair.  Neck: Supple, thyroid normal. No bruits  Respiratory: Respiratory effort normal, BS equal bilaterally without rales, rhonchi, wheezing or stridor.  Cardio: RRR with 2/6 systolic murmur without rubs or gallops.  Chest: symmetric, with normal excursions and percussion.  Abdomen: Soft, nontender, no guarding, rebound, hernias, masses, or organomegaly.  Lymphatics: Non tender without lymphadenopathy.  Musculoskeletal: Full ROM all peripheral extremities, good grip strength, non-antalgic, unsteady gait.  Skin: Warm, dry; fragile without rashes, lesions; many scattered small ecchymoses to extremities.  Neuro: Cranial nerves intact,,no cog wheeling, Normal muscle tone, wide based stance,  Neg pronator drift. Poor short term memory.  Psych: Awake and oriented X 3, normal affect, Insight and Judgment appropriate Breasts: mammogram as scheduled, no concerns GU: denies concerns, defer   Izora Ribas 5:25 PM [facility]

## 2021-08-26 ENCOUNTER — Encounter: Payer: Self-pay | Admitting: Diagnostic Neuroimaging

## 2021-08-26 ENCOUNTER — Ambulatory Visit (INDEPENDENT_AMBULATORY_CARE_PROVIDER_SITE_OTHER): Payer: Medicare Other | Admitting: Diagnostic Neuroimaging

## 2021-08-26 ENCOUNTER — Other Ambulatory Visit: Payer: Self-pay | Admitting: Adult Health

## 2021-08-26 VITALS — BP 129/85 | HR 85 | Ht 64.0 in | Wt 124.6 lb

## 2021-08-26 DIAGNOSIS — R809 Proteinuria, unspecified: Secondary | ICD-10-CM

## 2021-08-26 DIAGNOSIS — F0390 Unspecified dementia without behavioral disturbance: Secondary | ICD-10-CM

## 2021-08-26 DIAGNOSIS — F03A0 Unspecified dementia, mild, without behavioral disturbance, psychotic disturbance, mood disturbance, and anxiety: Secondary | ICD-10-CM

## 2021-08-26 MED ORDER — MEMANTINE HCL 10 MG PO TABS: 10.0000 mg | ORAL_TABLET | Freq: Two times a day (BID) | ORAL | 12 refills | Status: DC

## 2021-08-26 NOTE — Patient Instructions (Signed)
MEMORY LOSS (may be related to mild dementia; depression, alcohol abuse are also contributing factors) - check MRI brain (sudden change 2 weeks ago per daughter) - start memantine '10mg'$  at bedtime; increase to twice a day after 1-2 weeks - safety / supervision issues reviewed - daily physical activity / exercise (at least 15-30 minutes) - eat more plants / vegetables - increase social activities, brain stimulation, games, puzzles, hobbies, crafts, arts, music - aim for at least 7-8 hours sleep per night (or more) - avoid smoking and alcohol - caregiver resources provided - caution with medications, finances, driving

## 2021-08-26 NOTE — Progress Notes (Signed)
[facility]  PATIENT: Connie Alvarado DOB: 1941-10-28  REFERRING CLINICIAN: Unk Pinto, MD HISTORY FROM: patient and daughter  REASON FOR VISIT: follow up   HISTORICAL  CHIEF COMPLAINT:  Chief Complaint  Patient presents with   Memory Loss    Rm 7, FU req by dgtr [name] MMSE 23    HISTORY OF PRESENT ILLNESS:   UPDATE (08/26/21, VRP): Since last visit,more progression of memory symptoms. Especially in last 2 weeks. Word finding diff, repeating herself. PCP lab testing is good from yesterday. Less active. Less appetite.   PRIOR HPI (02/06/20): 80 year old female here for evaluation of memory loss. Patient denies any significant memory loss problems.  She does have some mild memory loss problems but she feels like she is able to do all of her day-to-day functioning without difficulty.  She has had a stressful year since her husband passed away in 2019-03-12.  Also she moved from [place] to [place] in 2019 for medical care for her husband.  Since that time she has sold her house in [place] and stayed in [place] to live with her daughter.  Patient has struggled with depression and alcohol abuse.  Patient has been drinking up to 1 bottle of wine per day for the past few years.  This gradually increased when patient retired in her 51s.  Daughter notes more problems with short-term memory loss, difficulty operating her phone, difficulty understanding complex financial transactions, decreased motivation and multitasking.  Initially daughter thought this was related to alcohol abuse but in times where patient has not been drinking, patient continued to have these memory problems.   01/16/2020 patient had excessive alcohol use, stumbled and fell down.  She went to the emergency room for evaluation.  Since that time she has not had any alcohol use.  Memory symptoms are stable.   REVIEW OF SYSTEMS: Full 14 system review of systems performed and negative  with exception of: As per HPI.  ALLERGIES: Allergies  Allergen Reactions   Penicillins     HOME MEDICATIONS: Outpatient Medications Prior to Visit  Medication Sig Dispense Refill   acetaminophen (TYLENOL) 325 MG tablet Take 650 mg by mouth every 6 (six) hours as needed.     amoxicillin (AMOXIL) 500 MG capsule Take 2 grams (4 pills) before a dental visit 8 capsule 1   ampicillin (PRINCIPEN) 500 MG capsule TAKE 4 CAPSULES BY MOUTH BEFORE EACH OF THE THREE PROCEDURE 8 capsule 0   atorvastatin (LIPITOR) 80 MG tablet TAKE 1 TABLET BY MOUTH DAILY FOR CHOLESTEROL 90 tablet 2   b complex vitamins capsule Take 1 capsule by mouth daily.     bismuth subsalicylate (PEPTO BISMOL) 262 MG/15ML suspension Take 30 mLs by mouth every 6 (six) hours as needed.     Budesonide ER 9 MG TB24 TAKE 1 TABLET BY MOUTH DAILY 30 tablet 3   Cyanocobalamin (B-12 PO) Take by mouth daily.     VITAMIN D PO Take 1 tablet by mouth daily.     Zinc-Vitamin C (ZINC-A-COLD/VITAMIN C MT) Use as directed in the mouth or throat.     diphenhydrAMINE (BENADRYL) 25 MG tablet Take 25 mg by mouth every 6 (six) hours as needed.     No facility-administered medications prior to visit.    PAST MEDICAL HISTORY: Past Medical History:  Diagnosis Date   Alcohol abuse    Anxiety    Depression    Hyperlipidemia    Lymphocytic colitis    Malaria  as a child   Memory loss     PAST SURGICAL HISTORY: Past Surgical History:  Procedure Laterality Date   CARDIAC VALVE SURGERY  1999   Aortic valve   CARDIAC VALVE SURGERY  2016   Aortic valve    FAMILY HISTORY: Family History  Problem Relation Age of Onset   Hypertension Mother    Hyperlipidemia Mother    Breast cancer Mother    Parkinson's disease Father    Kidney disease Father    Parkinsonism Father    Sleep apnea Daughter    Colon cancer Neg Hx    Esophageal cancer Neg Hx    Rectal cancer Neg Hx    Stomach cancer Neg Hx     SOCIAL HISTORY: Social History    Socioeconomic History   Marital status: Widowed    Spouse name: Not on file   Number of children: 1   Years of education: Not on file   Highest education level: Bachelor's degree (e.g., BA, AB, BS)  Occupational History    Comment: retired  Tobacco Use   Smoking status: Former    Types: Cigarettes    Quit date: 1980    Years since quitting: 42.6   Smokeless tobacco: Never  Vaping Use   Vaping Use: Never used  Substance and Sexual Activity   Alcohol use: Yes    Alcohol/week: 28.0 standard drinks    Types: 28 Standard drinks or equivalent per week    Comment: 08/26/21 bottle of wine per night,"2-3 glasses"   Drug use: Never   Sexual activity: Not Currently  Other Topics Concern   Not on file  Social History Narrative   08/26/21 lives with dgtr, [name]   Caffeine- coffee 2-3 c daily   Social Determinants of Health   Financial Resource Strain: Not on file  Food Insecurity: Not on file  Transportation Needs: Not on file  Physical Activity: Not on file  Stress: Not on file  Social Connections: Not on file  Intimate Partner Violence: Not on file     PHYSICAL EXAM  GENERAL EXAM/CONSTITUTIONAL: Vitals:  Vitals:   08/26/21 1510  BP: 129/85  Pulse: 85  Weight: 124 lb 9.6 oz (56.5 kg)  Height: '5\' 4"'$  (1.626 m)   Body mass index is 21.39 kg/m. Wt Readings from Last 3 Encounters:  08/26/21 124 lb 9.6 oz (56.5 kg)  08/25/21 125 lb 3.2 oz (56.8 kg)  08/03/21 127 lb (57.6 kg)   Patient is in no distress; well developed, nourished and groomed; neck is supple  CARDIOVASCULAR: Examination of carotid arteries is normal; no carotid bruits Regular rate and rhythm, no murmurs Examination of peripheral vascular system by observation and palpation is normal  EYES: Ophthalmoscopic exam of optic discs and posterior segments is normal; no papilledema or hemorrhages No results found.  MUSCULOSKELETAL: Gait, strength, tone, movements noted in Neurologic exam  below  NEUROLOGIC: MENTAL STATUS:  MMSE - [place] Exam 08/26/2021 05/14/2021 04/22/2020  Orientation to time '2 5 5  '$ Orientation to Place '4 5 5  '$ Registration '2 3 3  '$ Attention/ Calculation '3 5 5  '$ Recall '3 2 3  '$ Language- name 2 objects '2 2 2  '$ Language- repeat '1 1 1  '$ Language- follow 3 step command '3 3 3  '$ Language- read & follow direction '1 1 1  '$ Write a sentence '1 1 1  '$ Copy design '1 1 1  '$ Total score '23 29 30   '$ awake, alert, oriented to person [place] attention  and concentration language fluent, comprehension intact, naming intact fund of knowledge appropriate  CRANIAL NERVE:  2nd - no papilledema on fundoscopic exam 2nd, 3rd, 4th, 6th - pupils equal and reactive to light, visual fields full to confrontation, extraocular muscles intact, no nystagmus 5th - facial sensation symmetric 7th - facial strength symmetric 8th - hearing intact 9th - palate elevates symmetrically, uvula midline 11th - shoulder shrug symmetric 12th - tongue protrusion midline  MOTOR:  normal bulk and tone, full strength in the BUE, BLE  SENSORY:  normal and symmetric to light touch, temperature, vibration  COORDINATION:  finger-nose-finger, fine finger movements normal  REFLEXES:  deep tendon reflexes present and symmetric  GAIT/STATION:  narrow based gait    DIAGNOSTIC DATA (LABS, IMAGING, TESTING) - I reviewed patient records, labs, notes, testing and imaging myself where available.  Lab Results  Component Value Date   WBC 7.9 08/25/2021   HGB 12.7 08/25/2021   HCT 39.3 08/25/2021   MCV 96.3 08/25/2021   PLT 140 08/25/2021      Component Value Date/Time   NA 141 08/25/2021 1601   K 4.3 08/25/2021 1601   CL 104 08/25/2021 1601   CO2 28 08/25/2021 1601   GLUCOSE 100 (H) 08/25/2021 1601   BUN 17 08/25/2021 1601   CREATININE 0.75 08/25/2021 1601   CALCIUM 9.0 08/25/2021 1601   PROT 6.1 08/25/2021 1601   AST 22 08/25/2021 1601   ALT 24 08/25/2021 1601   BILITOT  0.9 08/25/2021 1601   GFRNONAA 88 05/14/2021 1242   GFRAA 102 05/14/2021 1242   Lab Results  Component Value Date   CHOL 207 (H) 08/25/2021   HDL 88 08/25/2021   LDLCALC 98 08/25/2021   TRIG 114 08/25/2021   CHOLHDL 2.4 08/25/2021   Lab Results  Component Value Date   HGBA1C 5.6 08/25/2021   Lab Results  Component Value Date   [id number] 08/25/2021   Lab Results  Component Value Date   TSH 1.88 08/25/2021    05/17/19 MRI brain [I reviewed images myself and agree with interpretation. -VRP]  - Generalized atrophy, not unexpected for age. - Moderately advanced T2 and FLAIR hyperintensities in the white matter consistent with chronic microvascular ischemic change. - No acute intracranial findings. No concerning abnormal postcontrast enhancement. 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  01/25/20 CT lumbar spine [I reviewed images myself and agree with interpretation. -VRP]  1. Findings consistent with a very tiny compression fracture of the anterior superior aspect of the T12 vertebral body, new since 05/14/2019. 2. 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 3. Mild to moderate spinal stenosis at L3-4, unchanged. 4. 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   ASSESSMENT AND PLAN  80 y.o. year old female here with 1 to 2 years of short-term memory loss, difficulty with learning new tasks, decreased motivation, decreased interest, also in the setting of depression and alcohol abuse.  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[place])      PLAN:  MEMORY LOSS (may be related to mild dementia; depression, alcohol abuse are also contributing factors) - check MRI brain (sudden change 2 weeks ago per daughter) - start memantine '10mg'$  at bedtime; increase to twice a day after 1-2 weeks - safety / supervision issues  reviewed - daily physical activity / exercise (at least 15-30 minutes) - eat more plants /  vegetables - increase social activities, brain stimulation, games, puzzles, hobbies, crafts, arts, music - aim for at least 7-8 hours sleep per night (or more) - avoid smoking and alcohol - caregiver resources provided - caution with medications, finances, driving  Meds ordered this encounter  Medications   memantine (NAMENDA) 10 MG tablet    Sig: Take 1 tablet (10 mg total) by mouth 2 (two) times daily.    Dispense:  60 tablet    Refill:  12   Orders Placed This Encounter  Procedures   MR BRAIN WO CONTRAST   Return in about 1 year (around 08/26/2022) for MyChart visit (35mn).    VPenni Bombard MD [id number] Certified in Neurology, Neurophysiology and Neuroimaging  [facility] [address] [place]  [phone]

## 2021-08-27 ENCOUNTER — Telehealth: Payer: Self-pay | Admitting: Diagnostic Neuroimaging

## 2021-08-27 NOTE — Telephone Encounter (Signed)
Medicare/BCBS fed order sent to GI. NPR they will reach out to the patient to schedule.

## 2021-08-30 LAB — URINALYSIS, ROUTINE W REFLEX MICROSCOPIC
Bacteria, UA: NONE SEEN /HPF
Bilirubin Urine: NEGATIVE
Glucose, UA: NEGATIVE
Hgb urine dipstick: NEGATIVE
Nitrite: NEGATIVE
Specific Gravity, Urine: 1.029 (ref 1.001–1.035)
pH: 6 (ref 5.0–8.0)

## 2021-08-30 LAB — COMPLETE METABOLIC PANEL WITH GFR
AG Ratio: 1.4 (calc) (ref 1.0–2.5)
ALT: 24 U/L (ref 6–29)
AST: 22 U/L (ref 10–35)
Albumin: 3.6 g/dL (ref 3.6–5.1)
Alkaline phosphatase (APISO): 63 U/L (ref 37–153)
BUN: 17 mg/dL (ref 7–25)
CO2: 28 mmol/L (ref 20–32)
Calcium: 9 mg/dL (ref 8.6–10.4)
Chloride: 104 mmol/L (ref 98–110)
Creat: 0.75 mg/dL (ref 0.60–0.95)
Globulin: 2.5 g/dL (calc) (ref 1.9–3.7)
Glucose, Bld: 100 mg/dL — ABNORMAL HIGH (ref 65–99)
Potassium: 4.3 mmol/L (ref 3.5–5.3)
Sodium: 141 mmol/L (ref 135–146)
Total Bilirubin: 0.9 mg/dL (ref 0.2–1.2)
Total Protein: 6.1 g/dL (ref 6.1–8.1)
eGFR: 80 mL/min/{1.73_m2} (ref >=60)

## 2021-08-30 LAB — CBC WITH DIFFERENTIAL/PLATELET
Absolute Monocytes: 624 cells/uL (ref 200–950)
Basophils Absolute: 40 cells/uL (ref 0–200)
Basophils Relative: 0.5 %
Eosinophils Absolute: 79 cells/uL (ref 15–500)
Eosinophils Relative: 1 %
HCT: 39.3 % (ref 35.0–45.0)
Hemoglobin: 12.7 g/dL (ref 11.7–15.5)
Lymphs Abs: 948 cells/uL (ref 850–3900)
MCH: 31.1 pg (ref 27.0–33.0)
MCHC: 32.3 g/dL (ref 32.0–36.0)
MCV: 96.3 fL (ref 80.0–100.0)
MPV: 12.3 fL (ref 7.5–12.5)
Monocytes Relative: 7.9 %
Neutro Abs: 6209 cells/uL (ref 1500–7800)
Neutrophils Relative %: 78.6 %
Platelets: 140 10*3/uL (ref 140–400)
RBC: 4.08 10*6/uL (ref 3.80–5.10)
RDW: 12.9 % (ref 11.0–15.0)
Total Lymphocyte: 12 %
WBC: 7.9 10*3/uL (ref 3.8–10.8)

## 2021-08-30 LAB — VITAMIN B12: Vitamin B-12: 580 pg/mL (ref 200–1100)

## 2021-08-30 LAB — LIPID PANEL
Cholesterol: 207 mg/dL — ABNORMAL HIGH (ref <200)
HDL: 88 mg/dL (ref >=50)
LDL Cholesterol (Calc): 98 mg/dL (calc)
Non-HDL Cholesterol (Calc): 119 mg/dL (calc) (ref <130)
Total CHOL/HDL Ratio: 2.4 (calc) (ref <5.0)
Triglycerides: 114 mg/dL (ref <150)

## 2021-08-30 LAB — IRON,TIBC AND FERRITIN PANEL
%SAT: 20 % (calc) (ref 16–45)
Ferritin: 108 ng/mL (ref 16–288)
Iron: 60 ug/dL (ref 45–160)
TIBC: 300 mcg/dL (calc) (ref 250–450)

## 2021-08-30 LAB — MICROALBUMIN / CREATININE URINE RATIO
Creatinine, Urine: 187 mg/dL (ref 20–275)
Microalb Creat Ratio: 18 mcg/mg creat (ref <30)
Microalb, Ur: 3.4 mg/dL

## 2021-08-30 LAB — HEMOGLOBIN A1C
Hgb A1c MFr Bld: 5.6 % of total Hgb (ref <5.7)
Mean Plasma Glucose: 114 mg/dL
eAG (mmol/L): 6.3 mmol/L

## 2021-08-30 LAB — MICROSCOPIC MESSAGE

## 2021-08-30 LAB — VITAMIN B1: Vitamin B1 (Thiamine): 52 nmol/L — ABNORMAL HIGH (ref 8–30)

## 2021-08-30 LAB — VITAMIN D 25 HYDROXY (VIT D DEFICIENCY, FRACTURES): Vit D, 25-Hydroxy: 54 ng/mL (ref 30–100)

## 2021-08-30 LAB — MAGNESIUM: Magnesium: 1.9 mg/dL (ref 1.5–2.5)

## 2021-08-30 LAB — TSH: TSH: 1.88 mIU/L (ref 0.40–4.50)

## 2021-09-09 ENCOUNTER — Ambulatory Visit
Admission: RE | Admit: 2021-09-09 | Discharge: 2021-09-09 | Disposition: A | Discharge status: Home / Self Care | Payer: Medicare Other | Source: Ambulatory Visit | Attending: Diagnostic Neuroimaging | Admitting: Diagnostic Neuroimaging

## 2021-09-09 ENCOUNTER — Other Ambulatory Visit: Payer: Self-pay

## 2021-09-09 DIAGNOSIS — F0390 Unspecified dementia without behavioral disturbance: Secondary | ICD-10-CM

## 2021-09-09 DIAGNOSIS — F03A0 Unspecified dementia, mild, without behavioral disturbance, psychotic disturbance, mood disturbance, and anxiety: Secondary | ICD-10-CM

## 2021-09-09 DIAGNOSIS — R413 Other amnesia: Secondary | ICD-10-CM | POA: Diagnosis not present

## 2021-09-17 ENCOUNTER — Other Ambulatory Visit: Payer: Self-pay

## 2021-09-17 ENCOUNTER — Encounter (HOSPITAL_COMMUNITY): Payer: Self-pay

## 2021-09-17 ENCOUNTER — Emergency Department (HOSPITAL_COMMUNITY): Payer: Medicare Other

## 2021-09-17 ENCOUNTER — Emergency Department (HOSPITAL_COMMUNITY)
Admission: EM | Admit: 2021-09-17 | Discharge: 2021-09-17 | Disposition: A | Discharge status: Home / Self Care | Payer: Medicare Other | Attending: Emergency Medicine | Admitting: Emergency Medicine

## 2021-09-17 ENCOUNTER — Ambulatory Visit (INDEPENDENT_AMBULATORY_CARE_PROVIDER_SITE_OTHER): Payer: Medicare Other | Admitting: Adult Health

## 2021-09-17 ENCOUNTER — Encounter: Payer: Self-pay | Admitting: Adult Health

## 2021-09-17 VITALS — BP 156/82 | HR 86 | Temp 97.7°F | Wt 121.0 lb

## 2021-09-17 DIAGNOSIS — Y9289 Other specified places as the place of occurrence of the external cause: Secondary | ICD-10-CM | POA: Insufficient documentation

## 2021-09-17 DIAGNOSIS — S51011D Laceration without foreign body of right elbow, subsequent encounter: Secondary | ICD-10-CM | POA: Diagnosis not present

## 2021-09-17 DIAGNOSIS — F1019 Alcohol abuse with unspecified alcohol-induced disorder: Secondary | ICD-10-CM | POA: Diagnosis not present

## 2021-09-17 DIAGNOSIS — S61412A Laceration without foreign body of left hand, initial encounter: Secondary | ICD-10-CM | POA: Diagnosis not present

## 2021-09-17 DIAGNOSIS — W19XXXA Unspecified fall, initial encounter: Secondary | ICD-10-CM

## 2021-09-17 DIAGNOSIS — M47812 Spondylosis without myelopathy or radiculopathy, cervical region: Secondary | ICD-10-CM | POA: Insufficient documentation

## 2021-09-17 DIAGNOSIS — W108XXA Fall (on) (from) other stairs and steps, initial encounter: Secondary | ICD-10-CM | POA: Diagnosis not present

## 2021-09-17 DIAGNOSIS — I6782 Cerebral ischemia: Secondary | ICD-10-CM | POA: Insufficient documentation

## 2021-09-17 DIAGNOSIS — Z043 Encounter for examination and observation following other accident: Secondary | ICD-10-CM | POA: Diagnosis not present

## 2021-09-17 DIAGNOSIS — G319 Degenerative disease of nervous system, unspecified: Secondary | ICD-10-CM | POA: Diagnosis not present

## 2021-09-17 DIAGNOSIS — Z87891 Personal history of nicotine dependence: Secondary | ICD-10-CM | POA: Insufficient documentation

## 2021-09-17 DIAGNOSIS — S61411A Laceration without foreign body of right hand, initial encounter: Secondary | ICD-10-CM | POA: Diagnosis not present

## 2021-09-17 DIAGNOSIS — S81812D Laceration without foreign body, left lower leg, subsequent encounter: Secondary | ICD-10-CM

## 2021-09-17 DIAGNOSIS — S61411D Laceration without foreign body of right hand, subsequent encounter: Secondary | ICD-10-CM | POA: Diagnosis not present

## 2021-09-17 DIAGNOSIS — S81812A Laceration without foreign body, left lower leg, initial encounter: Secondary | ICD-10-CM | POA: Insufficient documentation

## 2021-09-17 DIAGNOSIS — S41111A Laceration without foreign body of right upper arm, initial encounter: Secondary | ICD-10-CM

## 2021-09-17 DIAGNOSIS — M19041 Primary osteoarthritis, right hand: Secondary | ICD-10-CM | POA: Diagnosis not present

## 2021-09-17 MED ORDER — ACETAMINOPHEN 325 MG PO TABS
650.0000 mg | ORAL_TABLET | Freq: Once | ORAL | Status: AC
  Administered 2021-09-17: 650 mg via ORAL
  Filled 2021-09-17: qty 2

## 2021-09-17 MED ORDER — CEPHALEXIN 500 MG PO CAPS: 500.0000 mg | ORAL_CAPSULE | Freq: Three times a day (TID) | ORAL | 0 refills | Status: AC

## 2021-09-17 MED ORDER — ACETAMINOPHEN 325 MG PO TABS: 650.0000 mg | ORAL_TABLET | Freq: Once | ORAL | Status: DC

## 2021-09-17 NOTE — ED Notes (Signed)
Dressed wounds with steri strips, non-adhesive dressings, and coban

## 2021-09-17 NOTE — Progress Notes (Signed)
Assessment and Plan:  Connie Alvarado was seen today for wound check.  Diagnoses and all orders for this visit:  Skin tear of right elbow without complication, subsequent encounter Skin tear of right hand without complication, subsequent encounter Skin tear of left lower leg without complication, subsequent encounter All skin tears flaps reapproximated with 1/4 inch steri strips with fair coverage right hand and elbow, excellent coverage with left ankle. Non-adherent pads and cling wrap applied. Daughter present educated on wound monitoring and care. Continue vit C, D, zinc.  She has had complicated and prolonged wound healing with other recent injuries, will start keflex today with close follow up on Monday.  Follow up UC over the weekend if s/s of infection despite keflex.  -     cephALEXin (KEFLEX) 500 MG capsule; Take 1 capsule (500 mg total) by mouth 3 (three) times daily for 10 days.  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 Start by reducing from 4 glasses to 2 glasses/day. Eat prior to drinking alcohol and encouraged bottle of water in between each glass. Daughter will monitor for any concerning s/s of withdrawal. Risks of withdrawal discussed and emphasized slow taper. Plan to discuss further detail and medication options at close follow up due to limited time.   Further disposition pending results of labs. Discussed med's effects and SE's.   Over 30 minutes of exam, counseling, chart review, and critical decision making was performed.   Future Appointments  Date Time Provider [place]  09/22/2021 11:30 AM Magda Bernheim, NP [facility] None  09/25/2021 11:00 AM [facility] NURSE [facility] None  12/01/2021  1:00 PM [facility] DX DEXA 1 [facility]  01/13/2022 11:30 AM Unk Pinto, MD [facility] None  05/17/2022 11:00 AM [name], NP [facility] None  08/25/2022  2:00 PM  Penumalli, Earlean Polka, MD [facility] None  08/26/2022  3:00 PM [name], NP [facility] None    ------------------------------------------------------------------------------------------------------------------   HPI BP (!) 156/82   Pulse 86   Temp 97.7 F (36.5 C)   Wt 121 lb (54.9 kg)   SpO2 99%   BMI 20.77 kg/m  80 y.o.female with hx of alcohol abuse, memory impairement, frequent falls presents accompanied by her daughter for ED follow up for fall and skin tears. She reports after drinking typical bottle of wine last night fell against a wall, was unable to present to ED until this AM, had some difficulty re approximating skin tear wounds there and requested follow up evaluation. She had CT head/cervical unremarkable for acute changes. Had negative R hand xray. Denies bony/joint pain.   Past Medical History:  Diagnosis Date   Alcohol abuse    Anxiety    Depression    Hyperlipidemia    Lymphocytic colitis    Malaria    as a child   Memory loss      No Active Allergies  Current Outpatient Medications on File Prior to Visit  Medication Sig   acetaminophen (TYLENOL) 325 MG tablet Take 650 mg by mouth every 6 (six) hours as needed.   amoxicillin (AMOXIL) 500 MG capsule Take 2 grams (4 pills) before a dental visit   ampicillin (PRINCIPEN) 500 MG capsule TAKE 4 CAPSULES BY MOUTH BEFORE EACH OF THE THREE PROCEDURE   atorvastatin (LIPITOR) 80 MG tablet TAKE 1 TABLET BY MOUTH DAILY FOR CHOLESTEROL   b complex vitamins capsule Take 1 capsule by mouth daily.   bismuth subsalicylate (  PEPTO BISMOL) 262 MG/15ML suspension Take 30 mLs by mouth every 6 (six) hours as needed.   Budesonide ER 9 MG TB24 TAKE 1 TABLET BY MOUTH DAILY   Cyanocobalamin (B-12 PO) Take by mouth daily.   memantine (NAMENDA) 10 MG tablet Take 1 tablet (10 mg total) by mouth 2 (two) times daily.   VITAMIN D PO Take 1 tablet by mouth daily.   Zinc-Vitamin C (ZINC-A-COLD/VITAMIN C MT) Use as directed in the mouth or  throat.   No current facility-administered medications on file prior to visit.   Social History:   reports that she quit smoking about 42 years ago. Her smoking use included cigarettes. She has never used smokeless tobacco. She reports current alcohol use of about 28.0 standard drinks per week. She reports that she does not use drugs.  ROS: all negative except above.   Physical Exam:  BP (!) 156/82   Pulse 86   Temp 97.7 F (36.5 C)   Wt 121 lb (54.9 kg)   SpO2 99%   BMI 20.77 kg/m   General Appearance: Well nourished, well dressed elder female in no apparent distress. Eyes: PERRLA, conjunctiva no swelling or erythema Sinuses: No Frontal/maxillary tenderness ENT/Mouth: Ext aud canals clear, TMs without erythema, bulging. No erythema, swelling, or exudate on post pharynx.  Tonsils not swollen or erythematous. Hearing normal.  Neck: Supple Respiratory: Respiratory effort normal, BS equal bilaterally without rales, rhonchi, wheezing or stridor.  Cardio: RRR with no MRGs. Brisk peripheral pulses without edema.  Abdomen: Soft, + BS.  Non tender. Lymphatics: Non tender without lymphadenopathy.  Musculoskeletal: No obvious deformity, slow mildly unsteady but non-antalgic gait.  Skin: Warm, dry, very fragile; she has skin tear to R wrist approx 3 cm x 5 cm, R elbow approx 5 cm x 5 cm, and left anterior ankle approx 8cm x 3 cm; each with skin flap present majorly;  Neuro: Normal muscle tone, no cerebellar symptoms.  Psych: Awake and oriented X 3, normal affect, Insight and Judgment poor.     Connie Ribas, NP 1:24 PM [facility]

## 2021-09-17 NOTE — ED Triage Notes (Signed)
Pt reports falling last night. She has skin tears and abrasions to left shin, right wrist, right upper arm, and left forearm. Denies hitting her head, LOC, and doe not take blood thinners.

## 2021-09-17 NOTE — Discharge Instructions (Signed)
Please keep dressing in place for 48 hours. Dressing can be removed after that time and new dressings placed Allow Steri-Strips to come off on their own Have wounds rechecked at her primary physician in 2 to 4 days. Return if there are any signs of infection such as increased redness, swelling, or discharge from the wounds. Please check with her primary care provider today regarding prior treatment for, and current indications for treatment for any alcohol withdrawal symptoms.

## 2021-09-17 NOTE — ED Provider Notes (Signed)
[facility] DEPT Provider Note   CSN: [id number] Arrival date & time: 09/17/21  0744     History Chief Complaint  Patient presents with   Lytle Michaels    Connie Alvarado is a 80 y.o. female.  HPI 80 year old female chief complaint of fall.  She fell last night after drinking a bottle of wine.  She fell at the bottom of the steps.  There is daughter concern for falling down the steps.  Her daughter with whom she lives was in the next room.  She heard her fall.  She is immediately awake and alert.  She has a skin tear to the right hand, right upper arm, left hand, and left lower extremity.  She has been able to ambulate without difficulty since that time.  She is complaining of pain in the right hand and right arm.  She denies striking her head or altered level of consciousness.  She denies any neck pain, chest pain, abdominal pain, chest pain, or dyspnea.  She has not any blood thinners     Past Medical History:  Diagnosis Date   Alcohol abuse    Anxiety    Depression    Hyperlipidemia    Lymphocytic colitis    Malaria    as a child   Memory loss     Patient Active Problem List   Diagnosis Date Noted   At high risk for falls 05/14/2021   Memory problem 05/13/2021   Thiamine deficiency 05/13/2021   Aortic atherosclerosis ([place]) - CT 05/2020 06/11/2020   Emphysema of lung ([place]) - CT 05/2020 06/11/2020   Senile purpura ([place]) 04/22/2020   Lymphocytic colitis 04/21/2020   Lung nodules 05/15/2019   B12 deficiency 03/27/2019   Vitamin D deficiency 03/27/2019   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     Family History  Problem Relation Age of Onset   Hypertension Mother    Hyperlipidemia Mother    Breast cancer Mother    Parkinson's disease Father    Kidney disease Father    Parkinsonism Father    Sleep apnea Daughter    Colon cancer Neg Hx    Esophageal cancer Neg Hx    Rectal cancer Neg Hx    Stomach cancer Neg Hx     Social History   Tobacco Use   Smoking status: Former    Types: Cigarettes    Quit date: 1980    Years since quitting: 42.7   Smokeless tobacco: Never  Vaping Use   Vaping Use: Never used  Substance Use Topics   Alcohol use: Yes    Alcohol/week: 28.0 standard drinks    Types: 28 Standard drinks or equivalent per week    Comment: 08/26/21 bottle of wine per night,"2-3 glasses"   Drug use: Never    Home Medications Prior to Admission medications   Medication Sig Start Date End Date Taking? Authorizing Provider  acetaminophen (TYLENOL) 325 MG tablet Take 650 mg by mouth every 6 (six) hours as needed.    [provider]  amoxicillin (AMOXIL) 500 MG  capsule Take 2 grams (4 pills) before a dental visit 04/29/21   Croitoru, Mihai, MD  ampicillin (PRINCIPEN) 500 MG capsule TAKE 4 CAPSULES BY MOUTH BEFORE EACH OF THE THREE PROCEDURE 08/13/20   Croitoru, Dani Gobble, MD  atorvastatin (LIPITOR) 80 MG tablet TAKE 1 TABLET BY MOUTH DAILY FOR CHOLESTEROL 02/04/21   McClanahan, Danton Sewer, NP  b complex vitamins capsule Take 1 capsule by mouth daily.    [provider]  bismuth subsalicylate (PEPTO BISMOL) 262 MG/15ML suspension Take 30 mLs by mouth every 6 (six) hours as needed.    [provider]  Budesonide ER 9 MG TB24 TAKE 1 TABLET BY MOUTH DAILY 05/23/21   Thornton Park, MD  Cyanocobalamin (B-12 PO) Take by mouth daily.    [provider]  memantine (NAMENDA) 10 MG tablet Take 1 tablet (10 mg total) by mouth 2 (two) times daily. 08/26/21   Penumalli, Earlean Polka, MD  VITAMIN D PO Take 1 tablet by  mouth daily.    [provider]  Zinc-Vitamin C (ZINC-A-COLD/VITAMIN C MT) Use as directed in the mouth or throat.    [provider]    Allergies    Penicillins  Review of Systems   Review of Systems  All other systems reviewed and are negative.  Physical Exam Updated Vital Signs BP (!) 144/96   Pulse 93   Temp 98.1 F (36.7 C) (Oral)   Resp (!) 22   SpO2 97%   Physical Exam Vitals and nursing note reviewed.  Constitutional:      Appearance: Normal appearance.  HENT:     Head: Normocephalic.     Right Ear: External ear normal.     Left Ear: External ear normal.     Nose: Nose normal.     Mouth/Throat:     Pharynx: Oropharynx is clear.  Eyes:     Extraocular Movements: Extraocular movements intact.     Pupils: Pupils are equal, round, and reactive to light.  Cardiovascular:     Rate and Rhythm: Normal rate and regular rhythm.     Pulses: Normal pulses.  Pulmonary:     Effort: Pulmonary effort is normal.     Breath sounds: Normal breath sounds.  Abdominal:     General: Abdomen is flat. Bowel sounds are normal.     Palpations: Abdomen is soft.  Musculoskeletal:     Cervical back: Normal range of motion.     Comments: Right upper extremity with skin tear Dorsal aspect of right hand with skin tear and some mild diffuse tenderness Left lower leg with skin tear Full active range of motion bilateral shoulders, elbows, wrists, hands Full active range of motion of bilateral lower extremities and patient is amatory without difficulty No tenderness palpation over pelvis or hips There is a bruise on the right thigh that appears older and patient endorses this from a previous injury. Cervical spine palpated and some tenderness over C7 Thoracic and lumbar spine palpated without any signs of trauma and no tenderness palpation  Skin:    General: Skin is warm and dry.     Capillary Refill: Capillary refill takes less than 2 seconds.  Neurological:      General: No focal deficit present.     Mental Status: She is alert. Mental status is at baseline.     Cranial Nerves: No cranial nerve deficit.     Sensory: No sensory deficit.     Motor: No weakness.     Coordination: Coordination normal.  Gait: Gait normal.  Psychiatric:        Mood and Affect: Mood normal.        Behavior: Behavior normal.         ED Results / Procedures / Treatments   Labs (all labs ordered are listed, but only abnormal results are displayed) Labs Reviewed - No data to display  EKG None  Radiology No results found.  Procedures Procedures   Medications Ordered in ED Medications - No data to display  ED Course  I have reviewed the triage vital signs and the nursing notes.  Pertinent labs & imaging results that were available during my care of the patient were reviewed by me and considered in my medical decision making (see chart for details).  Clinical Course as of 09/17/21 1100  Thu Sep 17, 2021  1021 X-Eidan Muellner reviewed no evidence of acute fracture Radiology interpretation reviewed with deformity noted but no evidence of acute fracture. [DR]  6734 CT reviewed  [DR]  1059 No acute intracranial or cervical spine abnormality noted on my review or radiology interpretation of CT head and cervical spine [DR]    Clinical Course User Index [DR] Pattricia Boss, MD   MDM Rules/Calculators/A&P                           1- Fall-injuries appear to be limited to skin tears.  These wounds have been cleaned and Steri-Stripped in the ED.  We will have nonstick dressing placed and will need to follow-up with her primary care doctor 2- alcohol abuse daughter states that she has been drinking at least a bottle of wine.  This is increased since her husband's death several years ago.  At one point she did go through withdrawal protocol instituted by her primary care provider.  Her daughter is contacting the primary care provider to discuss treatment today.  There  does not currently appear to be any evidence of acute withdrawal Final Clinical Impression(s) / ED Diagnoses Final diagnoses:  Fall, initial encounter  Skin tear of right hand without complication, initial encounter  Skin tear of right upper arm without complication, initial encounter  Skin tear of left lower leg without complication, initial encounter    Rx / DC Orders ED Discharge Orders     None        Pattricia Boss, MD 09/17/21 1100

## 2021-09-17 NOTE — ED Notes (Signed)
Pt ambulatory with minimal assistance in ED lobby.

## 2021-09-17 NOTE — ED Notes (Signed)
Pt daughter with pt.

## 2021-09-21 NOTE — Progress Notes (Addendum)
Assessment and Plan: Joeli was seen today for follow-up.  Diagnoses and all orders for this visit:  Proteinuria, unspecified type -     Microalbumin / creatinine urine ratio  Skin tear of right elbow without complication, subsequent encounter Skin tear of right hand without complication, subsequent encounter Skin tear of left lower leg without complication, subsequent encounter All skin flaps remain approximated with steri strips.  Right elbow has an open area but remaining steri strips present , redressed Left ankle adhered well with steri strips redressed Right hand remains open, triple antibiotic ointment applied and redressed Continue Keflex Follow up Monday for reevaluation- daughter given instructions to keep areas clean and dry and monitor for signs of infection. Daughter educated on wound care and verbalizes understanding       Further disposition pending results of labs. Discussed med's effects and SE's.   Over 30 minutes of exam, counseling, chart review, and critical decision making was performed.   Future Appointments  Date Time Provider [place]  09/25/2021 11:00 AM [facility] NURSE [facility] None  12/01/2021  1:00 PM [facility] DX DEXA 1 [facility]  01/13/2022 11:30 AM Unk Pinto, MD [facility] None  05/17/2022 11:00 AM [name], NP [facility] None  08/25/2022  2:00 PM Penumalli, Earlean Polka, MD [facility] None  08/26/2022  3:00 PM [name], NP [facility] None    ------------------------------------------------------------------------------------------------------------------   HPI BP 130/84   Pulse 85   Temp 97.7 F (36.5 C)   Wt 122 lb 12.8 oz (55.7 kg)   SpO2 97%   BMI 21.08 kg/m  80 y.o.female presents for Evaluation of skin tears  Skin tears were treated initially 09/17/21 at ER and then again at our office.  The areas have been wrapped since applied that day.  She does complain of some tenderness but denies fever, chills,  foul odor or discharge from the areas.  She has been having some diarrhea with the use of Keflex but has been controlled with Pepto Bismol.  Patient is also having repeat urine for previous protein in urine.  Denies urinary frequency, pain, urgency or blood/odor to urine  Past Medical History:  Diagnosis Date   Alcohol abuse    Anxiety    Depression    Hyperlipidemia    Lymphocytic colitis    Malaria    as a child   Memory loss      No Active Allergies  Current Outpatient Medications on File Prior to Visit  Medication Sig   acetaminophen (TYLENOL) 325 MG tablet Take 650 mg by mouth every 6 (six) hours as needed.   amoxicillin (AMOXIL) 500 MG capsule Take 2 grams (4 pills) before a dental visit   ampicillin (PRINCIPEN) 500 MG capsule TAKE 4 CAPSULES BY MOUTH BEFORE EACH OF THE THREE PROCEDURE   atorvastatin (LIPITOR) 80 MG tablet TAKE 1 TABLET BY MOUTH DAILY FOR CHOLESTEROL   bismuth subsalicylate (PEPTO BISMOL) 262 MG/15ML suspension Take 30 mLs by mouth every 6 (six) hours as needed.   Budesonide ER 9 MG TB24 TAKE 1 TABLET BY MOUTH DAILY   cephALEXin (KEFLEX) 500 MG capsule Take 1 capsule (500 mg total) by mouth 3 (three) times daily for 10 days.   Cyanocobalamin (B-12 PO) Take by mouth daily.   VITAMIN D PO Take 1 tablet by mouth daily.   Zinc-Vitamin C (ZINC-A-COLD/VITAMIN C MT) Use as directed in the mouth or throat.   b complex vitamins capsule Take 1 capsule by mouth daily. (Patient not taking: Reported on  09/22/2021)   memantine (NAMENDA) 10 MG tablet Take 1 tablet (10 mg total) by mouth 2 (two) times daily. (Patient not taking: Reported on 09/22/2021)   No current facility-administered medications on file prior to visit.    ROS: all negative except above.   Physical Exam:  BP 130/84   Pulse 85   Temp 97.7 F (36.5 C)   Wt 122 lb 12.8 oz (55.7 kg)   SpO2 97%   BMI 21.08 kg/m   General Appearance: Well nourished, in no apparent distress. Eyes: PERRLA, EOMs,  conjunctiva no swelling or erythema Sinuses: No Frontal/maxillary tenderness ENT/Mouth: Ext aud canals clear, TMs without erythema, bulging. No erythema, swelling, or exudate on post pharynx.  Tonsils not swollen or erythematous. Hearing normal.  Neck: Supple, thyroid normal.  Respiratory: Respiratory effort normal, BS equal bilaterally without rales, rhonchi, wheezing or stridor.  Cardio: RRR with no MRGs. Brisk peripheral pulses without edema.  Abdomen: Soft, + BS.  Non tender, no guarding, rebound, hernias, masses. Lymphatics: Non tender without lymphadenopathy.  Musculoskeletal: Full ROM, 5/5 strength, normal gait.  Skin: Warm, dry , fragile, large ecchymoses of right arm and left leg. Steri strips remain intact. Right wrist is open but does not appear infected. Right elbow well approximated at top with steri strips, bottom is starting to approximate. Left ankle well approximated. Areas dressed with Telfa and Coban.  Neuro: Cranial nerves intact. Normal muscle tone, no cerebellar symptoms. Sensation intact.  Psych: Awake and oriented X 3, normal affect, Insight and Judgment appropriate.     Magda Bernheim, NP 11:58 AM [facility]

## 2021-09-22 ENCOUNTER — Ambulatory Visit (INDEPENDENT_AMBULATORY_CARE_PROVIDER_SITE_OTHER): Payer: Medicare Other | Admitting: Nurse Practitioner

## 2021-09-22 ENCOUNTER — Ambulatory Visit: Payer: Medicare Other | Admitting: Diagnostic Neuroimaging

## 2021-09-22 ENCOUNTER — Other Ambulatory Visit: Payer: Self-pay

## 2021-09-22 ENCOUNTER — Encounter: Payer: Self-pay | Admitting: Nurse Practitioner

## 2021-09-22 VITALS — BP 130/84 | HR 85 | Temp 97.7°F | Wt 122.8 lb

## 2021-09-22 DIAGNOSIS — R809 Proteinuria, unspecified: Secondary | ICD-10-CM

## 2021-09-22 DIAGNOSIS — S61411D Laceration without foreign body of right hand, subsequent encounter: Secondary | ICD-10-CM

## 2021-09-22 DIAGNOSIS — F1019 Alcohol abuse with unspecified alcohol-induced disorder: Secondary | ICD-10-CM

## 2021-09-22 DIAGNOSIS — S51011D Laceration without foreign body of right elbow, subsequent encounter: Secondary | ICD-10-CM

## 2021-09-22 DIAGNOSIS — S81812D Laceration without foreign body, left lower leg, subsequent encounter: Secondary | ICD-10-CM

## 2021-09-23 LAB — MICROALBUMIN / CREATININE URINE RATIO
Creatinine, Urine: 159 mg/dL (ref 20–275)
Microalb Creat Ratio: 23 mcg/mg creat (ref <30)
Microalb, Ur: 3.7 mg/dL

## 2021-09-25 ENCOUNTER — Ambulatory Visit: Payer: Medicare Other

## 2021-09-25 NOTE — Progress Notes (Addendum)
Assessment and Plan:  Connie Alvarado was seen today for follow-up.  Diagnoses and all orders for this visit:  Skin tear of right elbow without complication, subsequent encounter/ Skin tear of right hand without complication, subsequent encounter/ Skin tear of left lower leg without complication, subsequent encounter - Will do another 5 days of Keflex 500 mg TID Shin wound left leg can be open to air- steri strips intact and healing well Right hand and elbow apply telfa pad changed daily and follow up in 2 weeks Daughter verbalizes understanding of treatment and knows to call if areas become, red or warm to touch.  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 Discussed med's effects and SE's.   Over 30 minutes of exam, counseling, chart review, and critical decision making was performed.   Future Appointments  Date Time Provider [place]  12/01/2021  1:00 PM [facility] DX DEXA 1 [facility]  01/13/2022 11:30 AM Unk Pinto, MD [facility] None  05/17/2022 11:00 AM [name], NP [facility] None  08/25/2022  2:00 PM Penumalli, Earlean Polka, MD [facility] None  08/26/2022  3:00 PM [name], NP [facility] None    ------------------------------------------------------------------------------------------------------------------   HPI BP 120/72   Pulse 66   Temp (!) 97.5 F (36.4 C)   Wt 123 lb (55.8 kg)   SpO2 95%   BMI 21.11 kg/m  80 y.o.female presents for reevaluation of skin tears  The areas appear to be healing well  She had the dog jump on her last night and got a new skin tear on right thigh-  superficial, approx 2 cm. Slight erythema surrounding.   Past Medical History:  Diagnosis Date   Alcohol abuse    Anxiety    Depression    Hyperlipidemia    Lymphocytic colitis    Malaria    as a child   Memory loss      No Active Allergies  Current Outpatient Medications on File Prior to Visit  Medication Sig   acetaminophen (TYLENOL) 325 MG tablet Take 650 mg by mouth every 6 (six) hours as needed.   amoxicillin (AMOXIL) 500 MG capsule Take 2 grams (4 pills) before a dental visit   ampicillin (PRINCIPEN) 500 MG capsule TAKE 4 CAPSULES BY MOUTH BEFORE EACH OF THE THREE PROCEDURE   atorvastatin (LIPITOR) 80 MG tablet TAKE 1 TABLET BY MOUTH DAILY FOR CHOLESTEROL   b complex vitamins capsule Take 1 capsule by mouth daily.   bismuth subsalicylate (PEPTO BISMOL) 262 MG/15ML suspension Take 30 mLs by mouth every 6 (six) hours as needed.   Budesonide ER 9 MG TB24 TAKE 1 TABLET BY MOUTH DAILY   Cyanocobalamin (B-12 PO) Take by mouth daily.   memantine (NAMENDA) 10 MG tablet Take 1 tablet (10 mg total) by mouth 2 (two) times daily.   VITAMIN D PO Take 1 tablet by mouth daily.   Zinc-Vitamin C (ZINC-A-COLD/VITAMIN C MT) Use as directed in the mouth or throat.   No current facility-administered medications on file prior to visit.    ROS: all negative except above.   Physical Exam:  BP 120/72   Pulse 66   Temp (!) 97.5 F (36.4 C)   Wt 123 lb (55.8 kg)   SpO2 95%   BMI 21.11 kg/m   General Appearance: Well nourished, in no apparent distress. Eyes: PERRLA, EOMs, conjunctiva no swelling or erythema Sinuses: No Frontal/maxillary tenderness ENT/Mouth: Ext aud canals clear, TMs without erythema, bulging. No erythema, swelling, or exudate on post pharynx.  Tonsils not swollen or erythematous. Hearing normal.  Neck: Supple, thyroid normal.  Respiratory: Respiratory effort normal, BS equal bilaterally without rales, rhonchi, wheezing or stridor.  Cardio: RRR with no MRGs. Brisk  peripheral pulses without edema.  Abdomen: Soft, + BS.  Non tender, no guarding, rebound, hernias, masses. Lymphatics: Non tender without lymphadenopathy.  Musculoskeletal: Full ROM, 5/5 strength, normal gait.  Skin: Warm, dry , fragile, large ecchymoses of right arm and left leg. Right thigh has 2 cm superficial skin tear with surrounding erythema. Right wrist is open but does not appear infected. Right elbow well approximated at top with 1 steri strip the other has fallen off, bottom is starting to approximate. Left ankle well approximated with steri strips. Areas dressed with peroxide, Telfa and Coban.  Neuro: Cranial nerves intact. Normal muscle tone, no cerebellar symptoms. Sensation intact.  Psych: Awake and oriented X 3, normal affect, Insight and Judgment appropriate.     Magda Bernheim, NP 11:20 AM [facility]

## 2021-09-28 ENCOUNTER — Ambulatory Visit (INDEPENDENT_AMBULATORY_CARE_PROVIDER_SITE_OTHER): Payer: Medicare Other | Admitting: Nurse Practitioner

## 2021-09-28 ENCOUNTER — Encounter: Payer: Self-pay | Admitting: Nurse Practitioner

## 2021-09-28 ENCOUNTER — Other Ambulatory Visit: Payer: Self-pay

## 2021-09-28 VITALS — BP 120/72 | HR 66 | Temp 97.5°F | Wt 123.0 lb

## 2021-09-28 DIAGNOSIS — S51011D Laceration without foreign body of right elbow, subsequent encounter: Secondary | ICD-10-CM

## 2021-09-28 DIAGNOSIS — Z23 Encounter for immunization: Secondary | ICD-10-CM | POA: Diagnosis not present

## 2021-09-28 DIAGNOSIS — S81812D Laceration without foreign body, left lower leg, subsequent encounter: Secondary | ICD-10-CM

## 2021-09-28 DIAGNOSIS — S61411D Laceration without foreign body of right hand, subsequent encounter: Secondary | ICD-10-CM

## 2021-09-28 DIAGNOSIS — S81811A Laceration without foreign body, right lower leg, initial encounter: Secondary | ICD-10-CM

## 2021-09-28 DIAGNOSIS — D692 Other nonthrombocytopenic purpura: Secondary | ICD-10-CM

## 2021-09-28 DIAGNOSIS — R809 Proteinuria, unspecified: Secondary | ICD-10-CM

## 2021-09-28 MED ORDER — CEPHALEXIN 500 MG PO CAPS: 500.0000 mg | ORAL_CAPSULE | Freq: Three times a day (TID) | ORAL | 0 refills | Status: DC

## 2021-09-28 NOTE — Patient Instructions (Signed)
Skin Tear A skin tear is a wound in which the top layers of skin have peeled off from the deeper skin or tissues underneath. This is a common problem as people get older because the skin becomes thinner and more fragile. In addition, some medicines, such as oral corticosteroids, can lead to thinning skin if they are taken for long periods of time. A skin tear is often repaired with tape or skin adhesive strips. Depending on the location of the wound, a bandage (dressing) may be applied over the tape or adhesive strips. Follow these instructions at home: Wound care  Clean the wound as told by your health care provider. You may be instructed to keep the wound dry for the first few days. If you are told to clean the wound: Wash the wound as told by your health care provider. This may include using mild soap and water, a wound cleanser, or a salt-water (saline) solution. If using soap, rinse the wound with water to remove all soap. Do not rub the wound dry. Pat it gently with a clean towel or let it air-dry. Change any dressings as told by your health care provider. This may include changing the dressing if it gets wet, gets dirty, or starts to smell bad. Wash your hands with soap and water for at least 20 seconds before and after you change your bandage (dressing). If soap and water are not available, use hand sanitizer. Leave tape or skin adhesive strips in place. These skin closures may need to stay in place for 2 weeks or longer. If adhesive strip edges start to loosen and curl up, you may trim the loose edges. Do not remove adhesive strips completely unless your health care provider tells you to do that. Check your wound every day for signs of infection. Check for: Redness, swelling, or pain. More fluid or blood. Warmth. Pus or a bad smell. Do not scratch or pick at the wound. Protect the injured area until it has healed. Medicines Take or apply over-the-counter and prescription medicines only  as told by your health care provider. If you were prescribed an antibiotic medicine, take or apply it as told by your health care provider. Do not stop using the antibiotic even if your condition improves. General instructions  Keep the dressing dry as told by your health care provider. Do not take baths, swim, use a hot tub, or do anything that puts your wound underwater until your health care provider approves. Ask your health care provider if you may take showers. You may only be allowed to take sponge baths. Keep all follow-up visits. This is important. Contact a health care provider if: You have redness, swelling, or pain around your wound. You have more fluid or blood coming from your wound. Your wound, or the area around your wound, feels warm to the touch. You have pus or a bad smell coming from your wound. Get help right away if: You have a red streak that goes away from the skin tear. You have a fever and chills, and your symptoms suddenly get worse. Summary A skin tear is a wound in which the top layers of skin have peeled off from the deeper skin or tissues underneath. A skin tear is often repaired with tape or skin adhesive strips, and a bandage (dressing) may be applied over the tape or the adhesive strips. Change any dressings as told by your health care provider. Take or apply over-the-counter and prescription medicines only as told by  your health care provider. Contact a health care provider if you have signs of infection. This information is not intended to replace advice given to you by your health care provider. Make sure you discuss any questions you have with your health care provider. Document Revised: 03/19/2020 Document Reviewed: 03/19/2020 Elsevier Patient Education  [place].

## 2021-09-29 LAB — URINALYSIS, ROUTINE W REFLEX MICROSCOPIC
Bacteria, UA: NONE SEEN /HPF
Bilirubin Urine: NEGATIVE
Glucose, UA: NEGATIVE
Hgb urine dipstick: NEGATIVE
Nitrite: NEGATIVE
Specific Gravity, Urine: 1.03 (ref 1.001–1.035)
WBC, UA: NONE SEEN /HPF (ref 0–5)
pH: 6 (ref 5.0–8.0)

## 2021-09-29 LAB — MICROSCOPIC MESSAGE

## 2021-10-07 DIAGNOSIS — H5340 Unspecified visual field defects: Secondary | ICD-10-CM | POA: Diagnosis not present

## 2021-10-07 DIAGNOSIS — Z961 Presence of intraocular lens: Secondary | ICD-10-CM | POA: Diagnosis not present

## 2021-10-07 DIAGNOSIS — H532 Diplopia: Secondary | ICD-10-CM | POA: Diagnosis not present

## 2021-10-07 DIAGNOSIS — H35033 Hypertensive retinopathy, bilateral: Secondary | ICD-10-CM | POA: Diagnosis not present

## 2021-10-13 NOTE — Progress Notes (Signed)
Assessment and Plan:  Connie Alvarado was seen today for 2 week follow-up.  Diagnoses and all orders for this visit:  Skin tear of right elbow without complication, subsequent encounter/Skin tear of right hand without complication, subsequent encounter/Skin tear of left lower leg without complication, subsequent encounter All areas are well approximated and healed, no open areas remain. No S/S of infection. 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 Daughter is residing with patient and monitoring intake.   Senile purpura Discussed process, protect skin, sunscreen   Further disposition pending results of labs. Discussed med's effects and SE's.   Over 20 minutes of exam, counseling, chart review, and critical decision making was performed.   Future Appointments  Date Time Provider [place]  12/01/2021  1:00 PM GI-BCG DX DEXA 1 GI-BCGDG GI-BREAST CE  01/13/2022 11:30 AM Unk Pinto, MD GA[facility]-GAAIM None  05/17/2022 11:00 AM [name], NP [facility]AM-[facility] None  08/25/2022  2:00 PM Penumalli, Earlean Polka, MD GNA-GNA None  08/26/2022  3:00 PM [name], NP [facility]AM[facility]GAA[facility] None    ------------------------------------------------------------------------------------------------------------------   HPI BP 122/80   Pulse (!) 42   Temp (!) 97.5 F (36.4 C)   Wt 122 lb 12.8 oz (55.7 kg)   SpO2 94%   BMI 21.08 kg/m  80 y.o.female presents for skin tear evaluation  All skin tears are healed, no open areas. Daughter was dressing wounds at home without incident. She is continuing to limit alcohol consumption.    BMI is Body mass index is 21.08 kg/m., she has not been working on diet and exercise. Wt Readings from Last 3 Encounters:  10/14/21 122 lb 12.8 oz (55.7 kg)  09/28/21 123 lb (55.8 kg)  09/22/21 122 lb 12.8 oz (55.7 kg)    Past Medical History:  Diagnosis Date   Alcohol abuse     Anxiety    Depression    Hyperlipidemia    Lymphocytic colitis    Malaria    as a child   Memory loss      No Active Allergies  Current Outpatient Medications on File Prior to Visit  Medication Sig   acetaminophen (TYLENOL) 325 MG tablet Take 650 mg by mouth every 6 (six) hours as needed.   atorvastatin (LIPITOR) 80 MG tablet TAKE 1 TABLET BY MOUTH DAILY FOR CHOLESTEROL   bismuth subsalicylate (PEPTO BISMOL) 262 MG/15ML suspension Take 30 mLs by mouth every 6 (six) hours as needed.   Budesonide ER 9 MG TB24 TAKE 1 TABLET BY MOUTH DAILY   Cyanocobalamin (B-12 PO) Take by mouth daily.   memantine (NAMENDA) 10 MG tablet Take 1 tablet (10 mg total) by mouth 2 (two) times daily.   VITAMIN D PO Take 1 tablet by mouth daily.   Zinc-Vitamin C (ZINC-A-COLD/VITAMIN C MT) Use as directed in the mouth or throat.   amoxicillin (AMOXIL) 500 MG capsule Take 2 grams (4 pills) before a dental visit (Patient not taking: Reported on 10/14/2021)   ampicillin (PRINCIPEN) 500 MG capsule TAKE 4 CAPSULES BY MOUTH BEFORE EACH OF THE THREE PROCEDURE (Patient not taking: Reported on 10/14/2021)   b complex vitamins capsule Take 1 capsule by mouth daily. (Patient not taking: Reported on 10/14/2021)   cephALEXin (KEFLEX) 500 MG capsule Take 1 capsule (500 mg total) by mouth 3 (three) times daily. (Patient not taking: Reported on 10/14/2021)   No current facility-administered medications on file prior to visit.    ROS: all negative  except above.   Physical Exam:  BP 122/80   Pulse (!) 42   Temp (!) 97.5 F (36.4 C)   Wt 122 lb 12.8 oz (55.7 kg)   SpO2 94%   BMI 21.08 kg/m   General Appearance: Well nourished, in no apparent distress. Eyes: PERRLA, EOMs, conjunctiva no swelling or erythema Sinuses: No Frontal/maxillary tenderness ENT/Mouth: Ext aud canals clear, TMs without erythema, bulging. No erythema, swelling, or exudate on post pharynx.  Tonsils not swollen or erythematous. Hearing normal.   Neck: Supple, thyroid normal.  Respiratory: Respiratory effort normal, BS equal bilaterally without rales, rhonchi, wheezing or stridor.  Cardio: RRR with no MRGs. Brisk peripheral pulses without edema.  Abdomen: Soft, + BS.  Non tender, no guarding, rebound, hernias, masses. Lymphatics: Non tender without lymphadenopathy.  Musculoskeletal: Full ROM, 5/5 strength, normal gait.  Skin: Warm, dry . All skin tears are healed on right hand/forearm and left lower leg. No signs of infection. Bruising noted on extremities Neuro: Cranial nerves intact. Normal muscle tone, no cerebellar symptoms. Sensation intact.  Psych: Awake and oriented X 3, normal affect, Insight and Judgment appropriate.     Magda Bernheim, NP 11:23 AM [facility]

## 2021-10-14 ENCOUNTER — Other Ambulatory Visit: Payer: Self-pay

## 2021-10-14 ENCOUNTER — Encounter: Payer: Self-pay | Admitting: Nurse Practitioner

## 2021-10-14 ENCOUNTER — Ambulatory Visit (INDEPENDENT_AMBULATORY_CARE_PROVIDER_SITE_OTHER): Payer: Medicare Other | Admitting: Nurse Practitioner

## 2021-10-14 VITALS — BP 122/80 | HR 42 | Temp 97.5°F | Wt 122.8 lb

## 2021-10-14 DIAGNOSIS — F1019 Alcohol abuse with unspecified alcohol-induced disorder: Secondary | ICD-10-CM

## 2021-10-14 DIAGNOSIS — S51011D Laceration without foreign body of right elbow, subsequent encounter: Secondary | ICD-10-CM

## 2021-10-14 DIAGNOSIS — S81812D Laceration without foreign body, left lower leg, subsequent encounter: Secondary | ICD-10-CM | POA: Diagnosis not present

## 2021-10-14 DIAGNOSIS — S61411D Laceration without foreign body of right hand, subsequent encounter: Secondary | ICD-10-CM

## 2021-10-14 DIAGNOSIS — D692 Other nonthrombocytopenic purpura: Secondary | ICD-10-CM

## 2021-10-19 ENCOUNTER — Telehealth: Payer: Self-pay | Admitting: Gastroenterology

## 2021-10-19 DIAGNOSIS — K52832 Lymphocytic colitis: Secondary | ICD-10-CM

## 2021-10-19 DIAGNOSIS — R197 Diarrhea, unspecified: Secondary | ICD-10-CM

## 2021-10-19 MED ORDER — BUDESONIDE ER 9 MG PO TB24: 1.0000 | ORAL_TABLET | Freq: Every day | ORAL | 2 refills | Status: DC

## 2021-10-19 NOTE — Telephone Encounter (Signed)
Refill has been sent with 2 additional refills with a note that pt will need appt for further refills.

## 2021-10-19 NOTE — Telephone Encounter (Signed)
Patient needs a refill of budesonide, one daily, called into [facility] on [address], [phone].  If there is an issue, please call her at her daughter's number listed.  Thank you.

## 2021-10-19 NOTE — Telephone Encounter (Signed)
Pharmacy called daughter.  They told her it was too early to refill medication and she cannot refill it until next week sometime.  Can she go without the medicine or should she get some (without using insurance) to hold her until she can get them refilled?  Please call and advise.  Thank you.

## 2021-10-20 NOTE — Telephone Encounter (Signed)
LVM requesting returned call 

## 2021-10-21 NOTE — Telephone Encounter (Signed)
SECOND ATTEMPT: ° °LVM requesting returned call. °

## 2021-10-22 NOTE — Telephone Encounter (Signed)
FINAL ATTEMPT:  LVM requesting returned call 

## 2021-11-01 ENCOUNTER — Other Ambulatory Visit: Payer: Self-pay | Admitting: Adult Health Nurse Practitioner

## 2021-11-14 ENCOUNTER — Other Ambulatory Visit: Payer: Self-pay | Admitting: Internal Medicine

## 2021-11-14 MED ORDER — CEPHALEXIN 500 MG PO CAPS: ORAL_CAPSULE | ORAL | 1 refills | Status: DC

## 2021-11-16 DIAGNOSIS — Z85828 Personal history of other malignant neoplasm of skin: Secondary | ICD-10-CM | POA: Diagnosis not present

## 2021-11-16 DIAGNOSIS — D692 Other nonthrombocytopenic purpura: Secondary | ICD-10-CM | POA: Diagnosis not present

## 2021-11-23 ENCOUNTER — Other Ambulatory Visit: Payer: Self-pay | Admitting: Gastroenterology

## 2021-11-23 DIAGNOSIS — K52832 Lymphocytic colitis: Secondary | ICD-10-CM

## 2021-11-23 DIAGNOSIS — R197 Diarrhea, unspecified: Secondary | ICD-10-CM

## 2021-12-01 ENCOUNTER — Encounter: Payer: Self-pay | Admitting: Adult Health

## 2021-12-01 ENCOUNTER — Ambulatory Visit
Admission: RE | Admit: 2021-12-01 | Discharge: 2021-12-01 | Disposition: A | Discharge status: Home / Self Care | Payer: Medicare Other | Source: Ambulatory Visit | Attending: Adult Health | Admitting: Adult Health

## 2021-12-01 DIAGNOSIS — E2839 Other primary ovarian failure: Secondary | ICD-10-CM

## 2021-12-01 DIAGNOSIS — Z780 Asymptomatic menopausal state: Secondary | ICD-10-CM | POA: Diagnosis not present

## 2021-12-01 DIAGNOSIS — M8580 Other specified disorders of bone density and structure, unspecified site: Secondary | ICD-10-CM | POA: Insufficient documentation

## 2021-12-01 DIAGNOSIS — M8589 Other specified disorders of bone density and structure, multiple sites: Secondary | ICD-10-CM | POA: Diagnosis not present

## 2021-12-03 ENCOUNTER — Encounter: Payer: Self-pay | Admitting: Gastroenterology

## 2021-12-03 ENCOUNTER — Ambulatory Visit (INDEPENDENT_AMBULATORY_CARE_PROVIDER_SITE_OTHER): Payer: Medicare Other | Admitting: Gastroenterology

## 2021-12-03 ENCOUNTER — Other Ambulatory Visit: Payer: Medicare Other

## 2021-12-03 VITALS — BP 110/70 | HR 89 | Ht 63.0 in | Wt 121.0 lb

## 2021-12-03 DIAGNOSIS — K52832 Lymphocytic colitis: Secondary | ICD-10-CM

## 2021-12-03 DIAGNOSIS — R197 Diarrhea, unspecified: Secondary | ICD-10-CM | POA: Diagnosis not present

## 2021-12-03 DIAGNOSIS — K8689 Other specified diseases of pancreas: Secondary | ICD-10-CM

## 2021-12-03 MED ORDER — CREON 24000-76000 UNITS PO CPEP: ORAL_CAPSULE | ORAL | 3 refills | Status: DC

## 2021-12-03 NOTE — Patient Instructions (Signed)
If you are age 81 or older, your body mass index should be between 23-30. Your Body mass index is 21.43 kg/m. If this is out of the aforementioned range listed, please consider follow up with your Primary Care Provider.  If you are age 22 or younger, your body mass index should be between 19-25. Your Body mass index is 21.43 kg/m. If this is out of the aformentioned range listed, please consider follow up with your Primary Care Provider.   ________________________________________________________  The [facility] providers would like to encourage you to use [facility] to communicate with providers for non-urgent requests or questions.  Due to long hold times on the telephone, sending your provider a message by [facility] may be a faster and more efficient way to get a response.  Please allow 48 business hours for a response.  Please remember that this is for non-urgent requests.  _______________________________________________________  Start Creon 1 capsule with meals and snacks.  Continue the budesonide 9 mg daily.  Use the imodium as needed for diarrhea.   Follow up in 6 months or sooner if needed.  It was a pleasure to see you today!  Thank you for trusting me with your gastrointestinal care!

## 2021-12-03 NOTE — Progress Notes (Signed)
Referring Provider: Unk Pinto, MD Primary Care Physician:  Unk Pinto, MD   Chief complaint:  Diarrhea   IMPRESSION:  Lymphocytic colitis diagnosed on colonoscopy 07/06/2019  Unintentional weight loss Progressive memory decline Pancreatic elastase of 152 with altered bowel habits - ? Underlying pancreatic insufficiency Weight loss of 3 pounds since her last visit Iron deficiency, resolved on 07/2021 labs Small tubular adenoma removed on colonoscopy 07/06/2019    - surveillance not recommended given her advanced age Daily alcohol use     PLAN: - Continue budesonide 9 mg daily - Use PeptoBismal three 262 mg tablets 3 times daily for breakthrough symptoms - Use Loperamide recommended PRN for ongoing diarrhea - Continue to avoid all NSAIDs as this may flare lymphocytic colitis - Cut back on your daily alcohol and work to completely abstain - Start Creon-24,000 lipase, one to two capsules with meals and one capsule before snacks - Follow-up in 6 months, earlier if needed  I spent 40 minutes, including in depth chart review, independent review of results, communicating results with the patient directly, face-to-face time with the patient, coordinating care, ordering studies and medications as appropriate, and documentation.  HPI: Connie Alvarado is a 80 y.o. female under evaluation for diarrhea that initially developed after the death of her husband 2019-03-14. She had an EGD and colonoscopy 07/06/19 and was diagnosed with lymphocytic colitis and treatment with budesonide was initiated and then tapered to 6 mg daily. In September 2020 her budesonide was increased back to 9 mg daily with ongoing diarrhea. PeptoBismal was added for persistent symptoms. Attempt switch from budesonide to [place] did not occur due to cost.    She returns today in scheduled follow-up. She was last seen 12/2020. Her daughter accompanies her to this appointment and contributes to the history, as Mirjana has  had a decline in memory since her last visit.   Currently have two soft, mushy bowel movements daily while taking budesonide 9mg  in the morning. There is a sense of complete evacuation. No blood or mucous in the stool. No longer using Imodium or bismuth subsalicylate. Does not remember receiving treatment with Creon.   Continues to drink 2-3 glasses of wine every night.  Will drink Boost if she misses a meal.   Daughter notes that she has been eating smaller meals.   Most recent labs from 07/2021 show normal CMP except for glucose of 100, iron 60, ferritin 108, normal CBC  Prior endoscopy: EGD 07/06/2019: gastritis, a small hiatal hernia, and was otherwise endoscopically normal.  Biopsies showed chronic gastritis without H. pylori, reflux, and normal duodenum.   Colonoscopy 07/06/2019: internal hemorrhoids, a 3 mm sigmoid tubular adenoma, and mild sigmoid and descending colon diverticulosis.  Random colon biopsies were consistent with lymphocytic colitis.  Recent abdominal imaging: A CT of the abdomen and pelvis with contrast 05/14/2019 shows cardiomegaly a nonobstructing stone in the upper pole of the left kidney, sigmoid diverticulosis without diverticulitis, no acute intra-abdominal abnormality, and a 1.4 cm groundglass opacity at the right lung base and a 0.6 cm pulmonary nodule in the peripheral left lower lobe.  The radiologist recommended a noncontrasted chest CT in 6 months. Chest CT 06/10/2020 showed stable pulmonary nodule    Past Medical History:  Diagnosis Date   Alcohol abuse    Anxiety    Depression    Hyperlipidemia    Lymphocytic colitis    Malaria    as a child   Memory loss     Past Surgical History:  Procedure Laterality Date   CARDIAC VALVE SURGERY  1999   Aortic valve   CARDIAC VALVE SURGERY  2016   Aortic valve    Current Outpatient Medications  Medication Sig Dispense Refill   atorvastatin (LIPITOR) 80 MG tablet Take  1 tablet  Daily for Cholesterol                               /                     TAKE 1 TABLET BY MOUTH 90 tablet 3   bismuth subsalicylate (PEPTO BISMOL) 262 MG/15ML suspension Take 30 mLs by mouth every 6 (six) hours as needed.     Budesonide ER 9 MG TB24 TAKE 1 TABLET BY MOUTH DAILY 30 tablet 2   Cyanocobalamin (B-12 PO) Take by mouth daily.     Pancrelipase, Lip-Prot-Amyl, (CREON) 24000-76000 units CPEP One with meals and one with snacks 180 capsule 3   VITAMIN D PO Take 1 tablet by mouth daily.     Zinc-Vitamin C (ZINC-A-COLD/VITAMIN C MT) Use as directed in the mouth or throat.     No current facility-administered medications for this visit.    Allergies as of 12/03/2021   (No Active Allergies)    Family History  Problem Relation Age of Onset   Hypertension Mother    Hyperlipidemia Mother    Breast cancer Mother    Parkinson's disease Father    Kidney disease Father    Parkinsonism Father    Sleep apnea Daughter    Colon cancer Neg Hx    Esophageal cancer Neg Hx    Rectal cancer Neg Hx    Stomach cancer Neg Hx       Physical Exam: General: Awake, alert, and oriented, pleasant. In no acute distress.  Weight 01/07/21: 124 pounds Weight today 121 pounds  HEENT: EOMI, non-icteric sclera, NCAT, MMM  Neck: Normal movement of head and neck  Pulm: No labored breathing, speaking in full sentences without conversational dyspnea  Abdomen: Soft, NT, ND, normal bowel sounds, without rebound or guarding Derm: No obvious rash or bruise  MS: Moves all visible extremities without noticeable abnormality  Psych: Pleasant, cooperative, normal speech, normal affect and normal insight Neuro: Alert and appropriate   Sammie Denner L. Tarri Glenn, MD, MPH [facility] 12/03/2021, 7:28 PM

## 2021-12-10 LAB — CALPROTECTIN: Calprotectin: 108 mcg/g

## 2021-12-15 ENCOUNTER — Encounter: Payer: Self-pay | Admitting: Gastroenterology

## 2021-12-28 ENCOUNTER — Encounter: Payer: Self-pay | Admitting: Nurse Practitioner

## 2021-12-28 ENCOUNTER — Encounter: Payer: Self-pay | Admitting: Adult Health

## 2021-12-29 ENCOUNTER — Other Ambulatory Visit: Payer: Self-pay

## 2021-12-29 ENCOUNTER — Ambulatory Visit: Payer: Medicare Other | Admitting: Adult Health

## 2021-12-29 ENCOUNTER — Encounter: Payer: Self-pay | Admitting: Adult Health

## 2021-12-29 ENCOUNTER — Encounter: Payer: Self-pay | Admitting: Gastroenterology

## 2021-12-29 DIAGNOSIS — U071 COVID-19: Secondary | ICD-10-CM | POA: Insufficient documentation

## 2021-12-29 MED ORDER — NIRMATRELVIR/RITONAVIR (PAXLOVID)TABLET: 3.0000 | ORAL_TABLET | Freq: Two times a day (BID) | ORAL | 0 refills | Status: AC

## 2021-12-29 NOTE — Progress Notes (Signed)
THIS ENCOUNTER IS A VIRTUAL VISIT DUE TO COVID-19 - PATIENT WAS NOT SEEN IN THE OFFICE.  PATIENT HAS CONSENTED TO VIRTUAL VISIT / TELEMEDICINE VISIT   Virtual Visit via telephone Note  I connected with  Connie Alvarado on 12/29/2021 by telephone.  I verified that I am speaking with the correct person using two identifiers.    I discussed the limitations of evaluation and management by telemedicine and the availability of in person appointments. The patient expressed understanding and agreed to proceed.  History of Present Illness:  SpO2 96%  81 y.o. former smoker patient contacted office reporting URI sx. she tested positive by home rapid screen. OV was conducted by telephone to minimize exposure. This patient was vaccinated for covid 19, last boosted Oct or Nov 2022.   Patient with dementia, daughter gives most of hx. Patient family had staggered cases of + covid 28 starting around Christmas time, patient started having sx 12/26/2021, mainly rhinitis, mild occasional cough, did test positive on home rapid test on day 1. Persistent mild sx. Denies fever/chills. Per daughter she is sleeping more than usual since yesterday, though is able to get up and do ADLs, unloaded dishwasher yesterday. Patient denies dyspnea, wheezing, CP, back aching. O2 95-96%. 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   Treatments tried so far: tylenol cough, robitussin   Exposures: family    Medications  Current Outpatient Medications (Endocrine & Metabolic):    Budesonide ER 9 MG TB24, TAKE 1 TABLET BY MOUTH DAILY  Current Outpatient Medications (Cardiovascular):    atorvastatin (LIPITOR) 80 MG tablet, Take  1 tablet  Daily for Cholesterol                              /                     TAKE 1 TABLET BY MOUTH    Current Outpatient Medications (Hematological):    Cyanocobalamin (B-12 PO), Take by mouth daily.  Current Outpatient Medications (Other):    bismuth subsalicylate (PEPTO  BISMOL) 262 MG/15ML suspension, Take 30 mLs by mouth every 6 (six) hours as needed.   Cholecalciferol (VITAMIN D) 125 MCG (5000 UT) CAPS, Take by mouth.   Pancrelipase, Lip-Prot-Amyl, (CREON) 24000-76000 units CPEP, One with meals and one with snacks   vitamin C (ASCORBIC ACID) 500 MG tablet, Take 500 mg by mouth daily.   VITAMIN D PO, Take 1 tablet by mouth daily.   Zinc 50 MG TABS, Take by mouth.   Zinc-Vitamin C (ZINC-A-COLD/VITAMIN C MT), Use as directed in the mouth or throat.  Allergies: No Active Allergies  Problem list She has Alcohol abuse with alcohol-induced disorder ([place]); Medication management; Mixed hyperlipidemia; H/O prosthetic aortic valve replacement; B12 deficiency; Vitamin D deficiency; Major depression in complete remission ([place]); Lung nodules; Lymphocytic colitis; Senile purpura ([place]); Aortic atherosclerosis ([place]) - CT 05/2020; Emphysema of lung ([place]) - CT 05/2020; Memory problem; Thiamine deficiency; At high risk for falls; Osteopenia; and COVID-19 on their problem list.   Social History:   reports that she quit smoking about 43 years ago. Her smoking use included cigarettes. She has never used smokeless tobacco. She reports current alcohol use of about 28.0 standard drinks per week. She reports that she does not use drugs.  Observations/Objective:  General : Well sounding patient in no apparent distress HEENT: no hoarseness, no cough for duration of visit  Lungs: speaks in complete sentences, no audible wheezing, no apparent distress Neurological: alert, oriented x 3 Psychiatric: pleasant, judgement appropriate   Assessment and Plan:  Covid 19 Covid 19 positive per rapid screening test at home Risk factors include: age, former smoker Symptoms are: mild Daughter is primary caregiver and monitoring  Due to co morbid conditions and risk factors, discussed antivirals, interested in proceeding with paxlovid, which may decrease risk of hospitalization in high risk  individuals by up to 80%. SE and risks reviewed. Last GFR 80. Contact office if any SE/concerns. Patient and daughter were advised to hold atorvastatin at least 12 hours prior to starting med and an additional 1-2 weeks after completion of antiviral.  Immue support reviewed  Take tylenol PRN temp 101+ Push hydration Regular ambulation or calf exercises exercises for clot prevention and 81 mg ASA unless contraindicated Sx supportive therapy suggested Follow up via mychart or telephone if needed Advised patient obtain O2 monitor; present to ED if persistently <90% or with severe dyspnea, CP, fever uncontrolled by tylenol, confusion, sudden decline Should remain in isolation until at least 5 days from onset of sx, 24-48 hours fever free without tylenol, sx such as cough are improved.  -     nirmatrelvir/ritonavir EUA (PAXLOVID) 20 x 150 MG & 10 x 100MG  TABS; Take 3 tablets by mouth 2 (two) times daily for 5 days. Patient GFR is 80. Take nirmatrelvir (150 mg) two tablets twice daily for 5 days and ritonavir (100 mg) one tablet twice daily for 5 days.   Follow Up Instructions:  I discussed the assessment and treatment plan with the patient. The patient was provided an opportunity to ask questions and all were answered. The patient agreed with the plan and demonstrated an understanding of the instructions.   The patient was advised to call back or seek an in-person evaluation if the symptoms worsen or if the condition fails to improve as anticipated.  I provided 20 minutes of non-face-to-face time during this encounter.   Izora Ribas, NP

## 2022-01-04 ENCOUNTER — Telehealth: Payer: Self-pay | Admitting: Internal Medicine

## 2022-01-04 NOTE — Chronic Care Management (AMB) (Signed)
°  Chronic Care Management   Outreach Note  01/04/2022 Name: Shyra Emile MRN: [id number] DOB: 1941-05-21  Referred by: Unk Pinto, MD Reason for referral : No chief complaint on file.   An unsuccessful telephone outreach was attempted today. The patient was referred to the pharmacist for assistance with care management and care coordination.   Follow Up Plan:   Tatjana Dellinger Upstream Scheduler

## 2022-01-04 NOTE — Progress Notes (Signed)
°  Chronic Care Management   Note  01/04/2022 Name: Connie Alvarado MRN: [id number] DOB: [birth year]  Connie Alvarado is a 81 y.o. year old female who is a primary care patient of Connie Pinto, MD. I reached out to Connie Alvarado by phone today in response to a referral sent by Connie Alvarado's PCP, Connie Pinto, MD.   Connie Alvarado was given information about Chronic Care Management services today including:  CCM service includes personalized support from designated clinical staff supervised by her physician, including individualized plan of care and coordination with other care providers 24/7 contact phone numbers for assistance for urgent and routine care needs. Service will only be billed when office clinical staff spend 20 minutes or more in a month to coordinate care. Only one practitioner may furnish and bill the service in a calendar month. The patient may stop CCM services at any time (effective at the end of the month) by phone call to the office staff.   Connie Alvarado verbally agreed to assistance and services provided by embedded care coordination/care management team today.  Follow up plan: Connie Alvarado

## 2022-01-08 ENCOUNTER — Other Ambulatory Visit: Payer: Self-pay

## 2022-01-08 ENCOUNTER — Ambulatory Visit: Payer: Medicare Other | Admitting: Pharmacist

## 2022-01-08 DIAGNOSIS — E559 Vitamin D deficiency, unspecified: Secondary | ICD-10-CM

## 2022-01-08 DIAGNOSIS — E538 Deficiency of other specified B group vitamins: Secondary | ICD-10-CM

## 2022-01-08 DIAGNOSIS — Z9181 History of falling: Secondary | ICD-10-CM

## 2022-01-08 DIAGNOSIS — E782 Mixed hyperlipidemia: Secondary | ICD-10-CM

## 2022-01-08 DIAGNOSIS — E519 Thiamine deficiency, unspecified: Secondary | ICD-10-CM

## 2022-01-08 DIAGNOSIS — Z79899 Other long term (current) drug therapy: Secondary | ICD-10-CM

## 2022-01-08 DIAGNOSIS — F1019 Alcohol abuse with unspecified alcohol-induced disorder: Secondary | ICD-10-CM

## 2022-01-11 ENCOUNTER — Encounter: Payer: Self-pay | Admitting: Internal Medicine

## 2022-01-13 ENCOUNTER — Ambulatory Visit (INDEPENDENT_AMBULATORY_CARE_PROVIDER_SITE_OTHER): Payer: Medicare Other | Admitting: Internal Medicine

## 2022-01-13 ENCOUNTER — Other Ambulatory Visit: Payer: Self-pay

## 2022-01-13 ENCOUNTER — Encounter: Payer: Self-pay | Admitting: Internal Medicine

## 2022-01-13 VITALS — BP 128/76 | HR 71 | Temp 97.9°F | Resp 17 | Ht 63.0 in | Wt 120.4 lb

## 2022-01-13 DIAGNOSIS — R030 Elevated blood-pressure reading, without diagnosis of hypertension: Secondary | ICD-10-CM

## 2022-01-13 DIAGNOSIS — E782 Mixed hyperlipidemia: Secondary | ICD-10-CM | POA: Diagnosis not present

## 2022-01-13 DIAGNOSIS — R7309 Other abnormal glucose: Secondary | ICD-10-CM | POA: Diagnosis not present

## 2022-01-13 DIAGNOSIS — Z79899 Other long term (current) drug therapy: Secondary | ICD-10-CM | POA: Diagnosis not present

## 2022-01-13 DIAGNOSIS — I700 Atherosclerosis of aorta: Secondary | ICD-10-CM | POA: Diagnosis not present

## 2022-01-13 DIAGNOSIS — E559 Vitamin D deficiency, unspecified: Secondary | ICD-10-CM | POA: Diagnosis not present

## 2022-01-13 NOTE — Patient Instructions (Signed)

## 2022-01-13 NOTE — Progress Notes (Addendum)
Future Appointments  Date Time Provider Department  01/13/2022 11:30 AM Unk Pinto, MD [facility]  05/17/2022 11:00 AM [name], NP [facility]  07/06/2022 10:00 AM Newton Pigg, [facility]  08/26/2022  3:00 PM [name], NP [facility]  09/06/2022  1:30 PM Penumalli, Earlean Polka, MD [facility]    History of Present Illness:       This very nice 81 y.o. WWF presents for 3 month follow up with HTN, HLD, Pre-Diabetes and Vitamin D Deficiency. Patient has obvious Dementia - unable to reliably answer questions which are corrected by her accompanying caregiver daughter.  My chart messages from daughter indicates patient  Connie Alvarado  - has been consuming  increasing amounts of Alcohol .       Patient is treated for HTN . Todays BP  is at goal - 128/76. Patient has had no complaints of any cardiac type chest pain, palpitations, dyspnea / orthopnea / PND, dizziness, claudication, or dependent edema.       Hyperlipidemia is controlled with diet & meds. Patient denies myalgias or other med SEs. Patient has been off of her Atorvastatin for the last 2 weeks after treatment with Paxlovid for Covid. Last Lipids were at goal :  Lab Results  Component Value Date   CHOL 207 (H) 08/25/2021   HDL 88 08/25/2021   LDLCALC 98 08/25/2021   TRIG 114 08/25/2021   CHOLHDL 2.4 08/25/2021    Also, the patient has history of PreDiabetes and has had no symptoms of reactive hypoglycemia, diabetic polys, paresthesias or visual blurring.  Last A1c was at goal :  Lab Results  Component Value Date   HGBA1C 5.6 08/25/2021                                                          Further, the patient also has history of Vitamin D Deficiency and supplements vitamin D without any suspected side-effects. Last vitamin D was near goal :  Lab Results  Component Value Date   VD25OH 78 08/25/2021     Current Outpatient Medications on File Prior to Visit  Medication Sig                              (Patient not taking: Reported on 01/08/2022)   Budesonide ER 9 MG TB24 TAKE 1 TABLET  DAILY   VITAMIN D 5,000 u Take 1 tablet  daily.   Pancrelipase, Lip-Prot-Amyl, (CREON) 24000-76000 units CPEP One with meals and one with snacks   vitamin B-12 (500 MCG tablet Take  daily.   vitamin C 500 MG tablet Take daily.   Zinc 50 MG TABS Take 1 tablet daily.    No Active Allergies   PMHx:   Past Medical History:  Diagnosis Date   Alcohol abuse    Anxiety    Depression    Hyperlipidemia    Lymphocytic colitis    Malaria    as a child   Memory loss      Immunization History  Administered Date(s) Administered   Influenza, High Dose   09/28/2021   Moderna Covid-19 Vaccine Bivalent  11/10/2021   PFIZER SARS-COV-2 Vacc  02/19/2020, 03/11/2020, 11/26/2020   Pneumococcal  -13 12/18/2019   Pneumococcal  -23 08/03/2021  Td 04/26/2020     Past Surgical History:  Procedure Laterality Date   CARDIAC VALVE SURGERY  1999   Aortic valve   CARDIAC VALVE SURGERY  2016   Aortic valve    FHx:    Reviewed / unchanged  SHx:    Reviewed / unchanged   Systems Review:  Constitutional: Denies fever, chills, wt changes, headaches, insomnia, fatigue, night sweats, change in appetite. Eyes: Denies redness, blurred vision, diplopia, discharge, itchy, watery eyes.  ENT: Denies discharge, congestion, post nasal drip, epistaxis, sore throat, earache, hearing loss, dental pain, tinnitus, vertigo, sinus pain, snoring.  CV: Denies chest pain, palpitations, irregular heartbeat, syncope, dyspnea, diaphoresis, orthopnea, PND, claudication or edema. Respiratory: denies cough, dyspnea, DOE, pleurisy, hoarseness, laryngitis, wheezing.  Gastrointestinal: Denies dysphagia, odynophagia, heartburn, reflux, water brash, abdominal pain or cramps, nausea, vomiting, bloating, diarrhea, constipation, hematemesis, melena, hematochezia  or hemorrhoids. Genitourinary: Denies dysuria, frequency, urgency, nocturia,  hesitancy, discharge, hematuria or flank pain. Musculoskeletal: Denies arthralgias, myalgias, stiffness, jt. swelling, pain, limping or strain/sprain.  Skin: Denies pruritus, rash, hives, warts, acne, eczema or change in skin lesion(s). Neuro: No weakness, tremor, incoordination, spasms, paresthesia or pain. Psychiatric: Denies confusion, memory loss or sensory loss. Endo: Denies change in weight, skin or hair change.  Heme/Lymph: No excessive bleeding, bruising or enlarged lymph nodes.  Physical Exam  BP 128/76    Pulse 71    Temp 97.9 F (36.6 C)    Resp 17    Ht 5\' 3"  (1.6 m)    Wt 120 lb 6.4 oz (54.6 kg)    SpO2 96%    BMI 21.33 kg/m   Appears  well nourished, well groomed  and in no distress.  Eyes: PERRLA, EOMs, conjunctiva no swelling or erythema. Sinuses: No frontal/maxillary tenderness ENT/Mouth: EAC's clear, TM's nl w/o erythema, bulging. Nares clear w/o erythema, swelling, exudates. Oropharynx clear without erythema or exudates. Oral hygiene is good. Tongue normal, non obstructing. Hearing intact.  Neck: Supple. Thyroid not palpable. Car 2+/2+ without bruits, nodes or JVD. Chest: Respirations nl with BS clear & equal w/o rales, rhonchi, wheezing or stridor.  Cor: Heart sounds normal w/ regular rate and rhythm without sig. murmurs, gallops, clicks or rubs. Peripheral pulses normal and equal  without edema.  Abdomen: Soft & bowel sounds normal. Non-tender w/o guarding, rebound, hernias, masses or organomegaly.  Lymphatics: Unremarkable.  Musculoskeletal: Full ROM all peripheral extremities, joint stability, 5/5 strength and normal gait.  Skin: Warm, dry without exposed rashes, lesions or ecchymosis apparent.  Neuro: Cranial nerves intact, reflexes equal bilaterally. Sensory-motor testing grossly intact. Tendon reflexes grossly intact.  Pysch: Alert & oriented x 3.  Insight and judgement nl & appropriate. No ideations.  Assessment and Plan:  1. Elevated BP without diagnosis of  hypertension  - Continue medication, monitor blood pressure at home.  - Continue DASH diet.  Reminder to go to the ER if any CP,  SOB, nausea, dizziness, severe HA, changes vision/speech.   - CBC with Differential/Platelet - COMPLETE METABOLIC PANEL WITH GFR - Magnesium - TSH  2. Hyperlipidemia, mixed   - Continue diet/meds, exercise,& lifestyle modifications.  - Continue monitor periodic cholesterol/liver & renal functions      - Lipid panel - TSH  3. Abnormal glucose  - Continue diet, exercise  - Lifestyle modifications.  - Monitor appropriate labs   - COMPLETE METABOLIC PANEL WITH GFR - Hemoglobin A1c  4. Vitamin D deficiency  - Continue supplementation    - VITAMIN D 25 Hydroxy  5. Aortic atherosclerosis ([place]) - CT 05/2020  - Lipid panel  6. Medication management  - CBC with Differential/Platelet - COMPLETE METABOLIC PANEL WITH GFR - Magnesium - Lipid panel - TSH - Hemoglobin A1c - VITAMIN D 25 Hydroxy       Discussed  regular exercise, BP monitoring, weight control to achieve/maintain BMI less than 25 and discussed med and SE's. Recommended labs to assess and monitor clinical status with further disposition pending results of labs.  I discussed the assessment and treatment plan with the patient. The patient was provided an opportunity to ask questions and all were answered. The patient agreed with the plan and demonstrated an understanding of the instructions.  I provided over 30 minutes of exam, counseling, chart review and  complex critical decision making.      The patient was advised to call back or seek an in-person evaluation if the symptoms worsen or if the condition fails to improve as anticipated.       Patient was strongly discouraged from drinking alcohol, but she didn't seem to recall that for 60 seconds ! Patient also given written instructions not to drink alcohol.    Kirtland Bouchard, MD

## 2022-01-14 LAB — LIPID PANEL
Cholesterol: 314 mg/dL — ABNORMAL HIGH (ref <200)
HDL: 74 mg/dL (ref >=50)
LDL Cholesterol (Calc): 196 mg/dL (calc) — ABNORMAL HIGH
Non-HDL Cholesterol (Calc): 240 mg/dL (calc) — ABNORMAL HIGH (ref <130)
Total CHOL/HDL Ratio: 4.2 (calc) (ref <5.0)
Triglycerides: 248 mg/dL — ABNORMAL HIGH (ref <150)

## 2022-01-14 LAB — CBC WITH DIFFERENTIAL/PLATELET
Absolute Monocytes: 562 cells/uL (ref 200–950)
Basophils Absolute: 31 cells/uL (ref 0–200)
Basophils Relative: 0.4 %
Eosinophils Absolute: 70 cells/uL (ref 15–500)
Eosinophils Relative: 0.9 %
HCT: 39.9 % (ref 35.0–45.0)
Hemoglobin: 13.3 g/dL (ref 11.7–15.5)
Lymphs Abs: 624 cells/uL — ABNORMAL LOW (ref 850–3900)
MCH: 31.4 pg (ref 27.0–33.0)
MCHC: 33.3 g/dL (ref 32.0–36.0)
MCV: 94.3 fL (ref 80.0–100.0)
MPV: 12.4 fL (ref 7.5–12.5)
Monocytes Relative: 7.2 %
Neutro Abs: 6513 cells/uL (ref 1500–7800)
Neutrophils Relative %: 83.5 %
Platelets: 134 10*3/uL — ABNORMAL LOW (ref 140–400)
RBC: 4.23 10*6/uL (ref 3.80–5.10)
RDW: 12.7 % (ref 11.0–15.0)
Total Lymphocyte: 8 %
WBC: 7.8 10*3/uL (ref 3.8–10.8)

## 2022-01-14 LAB — HEMOGLOBIN A1C
Hgb A1c MFr Bld: 5.8 % of total Hgb — ABNORMAL HIGH (ref <5.7)
Mean Plasma Glucose: 120 mg/dL
eAG (mmol/L): 6.6 mmol/L

## 2022-01-14 LAB — COMPLETE METABOLIC PANEL WITH GFR
AG Ratio: 1.4 (calc) (ref 1.0–2.5)
ALT: 23 U/L (ref 6–29)
AST: 21 U/L (ref 10–35)
Albumin: 3.5 g/dL — ABNORMAL LOW (ref 3.6–5.1)
Alkaline phosphatase (APISO): 59 U/L (ref 37–153)
BUN/Creatinine Ratio: 25 (calc) — ABNORMAL HIGH (ref 6–22)
BUN: 15 mg/dL (ref 7–25)
CO2: 29 mmol/L (ref 20–32)
Calcium: 8.8 mg/dL (ref 8.6–10.4)
Chloride: 106 mmol/L (ref 98–110)
Creat: 0.59 mg/dL — ABNORMAL LOW (ref 0.60–0.95)
Globulin: 2.5 g/dL (calc) (ref 1.9–3.7)
Glucose, Bld: 99 mg/dL (ref 65–99)
Potassium: 4.4 mmol/L (ref 3.5–5.3)
Sodium: 142 mmol/L (ref 135–146)
Total Bilirubin: 0.6 mg/dL (ref 0.2–1.2)
Total Protein: 6 g/dL — ABNORMAL LOW (ref 6.1–8.1)
eGFR: 90 mL/min/{1.73_m2} (ref >=60)

## 2022-01-14 LAB — TSH: TSH: 1.68 mIU/L (ref 0.40–4.50)

## 2022-01-14 LAB — VITAMIN D 25 HYDROXY (VIT D DEFICIENCY, FRACTURES): Vit D, 25-Hydroxy: 39 ng/mL (ref 30–100)

## 2022-01-14 LAB — MAGNESIUM: Magnesium: 2 mg/dL (ref 1.5–2.5)

## 2022-01-14 NOTE — Progress Notes (Signed)
============================================================ °-   Test results slightly outside the reference range are not unusual. If there is anything important, I will review this with you,  otherwise it is considered normal test values.  If you have further questions,  please do not hesitate to contact me at the office or via My Chart.  ============================================================ ============================================================  -  Total Chol = 314     &     LDL Chol  = 196    - Both are very high  !  - Start back on your Atorvastatin   - Also suggest that you eat     #4  [place] nuts     Once /week   - has been shown to have a strong effect on lowering the bad LDL Cholesterol ! ============================================================ ============================================================  - A1c  = 5.8 shows high sugar  - So   - Avoid Sweets, Candy & White Stuff   - White Rice, White Potatoes, White Flour  - Breads &  Pasta  - Also Avoid    ALL     Alcoholic Beverages ============================================================ ============================================================  - Vitamin D = 39 - Very Low -  - Recommend increase your Vit D 5,000 unit capsules                                                                 up to #2 capsules = 10,000 units  /day  ============================================================ ============================================================  - All Else - CBC - Kidneys - Electrolytes - Liver - Magnesium & Thyroid    - all  Normal / OK  ============================================================ ============================================================

## 2022-01-22 NOTE — Progress Notes (Signed)
Pharmacist Visit  Connie Alvarado,Connie Alvarado  [id number] 1 years, Female  DOB: 1941/06/04  M: [phone] Care Team: Otto Herb  __________________________________________________ Clinical Summary Situation:: Connie Alvarado is an 81 year old female presenting for CCM initial visit. Completed phone visit with daughter who is very motivated to ensure mother's chronic conditions are controlled. Pt is currently recovering from COVID-19 infection Background:: Pt has a history of HLD, alcohol abuse, Vitamin B1, B12 and D deficiency, Depression, osteopenia, and memory loss. Pt cholesterol has been slightly elevated above 200 for the past 2 years. Pt is currently holding statin due to taking antiviral to treat COVID. She will resume statin on 01/16/22. Pt has a recent T-score of -1.9 on 12/22 showing osteopenia. 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 Assessment:: Pt cholesterol is uncontrolled at this time. Pt may benefit from starting bisphosphonate due to hip FRAX score >3% and diagnosis of osteopenia. Recommendations:: Pt to continue dietary modifications to further reduce lipids. Will resume atorvastatin on 01/16/22 Consider starting bisphosphonate in patient for prevention of osteoporosis in patient Attestation Statement:: CCM Services:  This encounter meets complex CCM services and moderate to high medical decision making.  Prior to outreach and patient consent for Chronic Care Management, I referred this patient for services after reviewing the nominated patient list or from a personal encounter with the patient.  I have personally reviewed this encounter including the documentation in this note and have collaborated with the care management provider regarding care management and care coordination activities to include development and update of the comprehensive care plan I am certifying that I agree with the content of this note and encounter  as supervising physician   Chronic Conditions Patient's Chronic Conditions: Hyperlipidemia/Dyslipidemia (HLD), Osteopenia or Osteoporosis, Depression,  Senile purpura, Emphysema, Lymphocytic colitis, B12 deficiency, Vitamin D deficiency, lung nodules, memory problem, Thiamine deficiency    Doctor and Hospital Visits Were there PCP Visits in last 6 months?: Yes Visit #1: 08/03/21-Ashley Corbett, NP (PCP)-Pt. presented accompanied by her daughter for 3 days follow up after UC visit for R hand skin tear. She was placed on 10 day course of doxycycline. No pain, erythema or discharge noted. No medication changes noted. Visit #2: 08/25/21-Ashley Corbett, NP-Pt. presented for CPE and f/u. STOPPED Ascorbic Acid 500 mg Oral Daily Doxycycline Hyclate 100 MG Take  1 capsule QD For Infection (Completed Course) Visit #3: 09/17/21-Ashley Corbett, NP-Pt. presented for ED follow up for fall and skin tears, STARTED Cephalexin 500 mg TID Visit #4: 09/22/21-Dana Demetra Shiner, NP- Pt, presented for f/u for skin tear of right elbow without complication, Skin tear of right hand without complication, and skin tear of left lower leg without complication, She has been having some diarrhea with the use of Keflex but has been controlled with Pepto Bismol. No medication changes noted. Visit #5: 09/28/21-Dana Mull, NP-Pt. presented for reevaluation of skin tears. STARTED  Cephalexin 500 mg Oral TID  Visit #6: 10/14/21-Dana Mull, NP-Pt. presented for skin tear evaluation. All skin tears are healed, no open areas. STOPPED Amoxicillin 500 MG Take 2 grams (4 pills) before a dental visit Ampicillin 500 MG QID B Complex Vitamins 1 capsule QD Cephalexin 500 mg Oral TID Additional Visits: 12/29/21-Ashley Corbett, NP-Pt. presented for a tele visit for Covid-19 positive dx. STARTED  Nirmatrelvir-Ritonavir 20 x 150 MG & 10 x 100MG 3 tablets Oral 2 times daily, Take Nirmatrelvir (150 mg) two tablets BID for 5 days and ritonavir (  100  mg) one tablet TID  Were there Specialist Visits in last 6 months?: Yes Visit #1: 07/31/21-Thomas Juopperi, PA (Urgent Care)-Pt. presented for skin tear on right hand after dog jumped on and off lap. STARTED  Bacitracin 500 UNIT/GM ointment  Apply topically 2 (two) times a day Doxycycline (Vibramycin) 100 MG capsule  Indications: Take 1 capsule (100 mg total) by mouth in the morning and 1 capsule (100 mg total) in the evening     Visit #2: 08/26/21-Penumalli, Earlean Polka, MD (Neurology)-Pt. presented for 1 to 2 years of short-term memory loss, difficulty with learning new tasks, decreased motivation, decreased interest, also in the setting of depression and alcohol abuse. STARTED Memantine HCl 10 mg Oral 2 BID  STOPPED Diphenhydramine HCl 25 mg Oral Every 6 hours PRN (Patient Preference)  Visit #3: 12/03/21-Beavers, Joelene Millin, MD (Gastroenterology)-Pt. presented for diarrhea that initially developed after the death of her husband 06-Mar-2019. She had an EGD and colonoscopy 07/06/19 and was diagnosed with lymphocytic colitis and treatment with budesonide was initiated and then tapered to 6 mg daily. STARTED  Pancrelipase 24000-76000 units One with meals and one with snacks   STOPPED Acetaminophen 650 mg Oral Every 6 hours PRN Cephalexin 500 MG Take 1 capsule QID Memantine HCl 10 mg Oral BID  Was there a Hospital Visit in last 30 days?: No Were there other Hospital Visits in last 6 months?: Yes Visit #1: 09/17/21-Hospital visit d/t chief complaint of a fall after drinking a bottle of wine.  She fell at the bottom of the steps.   She has a skin tear to the right hand, right upper arm, left hand, and left lower extremity.  No medication changes noted.  Medication Information Are there any Medication discrepancies?: Yes Details:  Atorvastatin $RemoveBefor'80mg'PkmiYhJXHKkj$ -11/01/21 (>5 days gap) 90DS by [place] Budesonide ER $RemoveBefor'9mg'YYInJnUSWKSn$ -11/24/21 (>5 days gap) 90DS by [facility]  Are there any Medication  adherence gaps (beyond 5 days past due)?: Yes Details:  Atorvastatin $RemoveBefor'80mg'ZyDbSroLfVFY$ -11/01/21 (>5 days gap) 90DS by [place] Budesonide ER $RemoveBefor'9mg'OcCpcKsZfxxc$ -11/24/21 (>5 days gap) 90DS by [place]  Medication adherence rates for the STAR rating drugs:  Atorvastatin $RemoveBefor'80mg'TIgaEVKexsfm$ -11/01/21 (>5 days gap) 90DS by [place]  List Patient's current Care Gaps: No current Care Gaps identified  Disease Assessments Current BP: 122/80 Current HR: 42 taken on: 10/13/2021 Previous BP: 120/72 Previous HR: 66 taken on: 09/27/2021 Weight: 122 BMI: 21.08 Last GFR: 88 taken on: 05/13/2021 Why did the patient present?: CCM initial visit Marital status?: Widowed 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 Room is upstairs in the house, patient goes up and down stairs. Usually goes for a walk daily but since COVID-19 infection she paused this. She just started to walk outside 2 days ago. She does other houshold activities such as laundry and dishes. She loves to watch fish tank and sits outside and birdwatch when warm. Goes on trips to [place] 2 times a year with family and friends. Who does the patient spend their time with and what do they do?: Daughter, relatives in [place], [place] friend in [place] habits such as diet and exercise?: Diet: Breakfast: cereal, dates, pecans, raisins and fresh fruits. 1 cup coffee. Lunch: soup and tortilla chips and hummus. Will also drink a boost. Dinner: daughter cooks. sometimes will not have dinner.  Alcohol, tobacco, and illicit drug usage?: Alcohol: most of the time 2 glasses a night if daughter monitors patient. 50%  of the time pt will sneak an additional 3rd glass of wine. Patient does not eat red meat Tobacco: none Illegal drugs: none What is the patient's sleep pattern?: No sleep issues How many hours per night does patient typically sleep?: 12 hours Patient pleased with health  care they are receiving?: Yes Family, occupational, and living circumstances relevant to overall health?: Her family history includes Breast cancer, Hyperlipidemia, and Hypertension in Mother, Kidney disease, Parkinson's disease, Parkinsonism in Father and Sleep apnea in Daughter.  Name and location of Current pharmacy: [facility], [place] Current Rx insurance plan: Other Details: [employer] Are meds synced by current pharmacy?: No Are meds delivered by current pharmacy?: Yes - by local pharmacy Would patient benefit from direct intervention of clinical lead in dispensing process to optimize clinical outcomes?: No Are [facility] services available where patient lives?: Yes Is patient disadvantaged to use [facility]?: No [facility] services reviewed with patient and patient wishes to change pharmacy?: No Select reason patient declined to change pharmacies: Other Details: Daughter states pt is not taking many medications at this time. If later on, patient starts taking more medicines, will consider [facility] Does patient experience delays in picking up medications due to transportation concerns (getting to pharmacy)?: No Any additional demeanor/mood notes?: Connie Alvarado is an 81 year old female presenting for CCM initial visit. Completed phone visit with daughter.  Hyperlipidemia/Dyslipidemia (HLD) Last Lipid panel on: 08/24/2021 TC (Goal<200): 207 LDL: 98 HDL (Goal>40): 88 TG (Goal<150): 114 ASCVD 10-year risk?is:: N/A due to Age > 79 Assess this condition today?: Yes LDL Goal: <100 Has patient tried and failed any HLD Medications?: No Check present secondary causes (below) that can lead to increased cholesterol levels (multi-choice optional): Alcohol use, Steroids We discussed: How to reduce cholesterol through diet/weight management and physical activity., Encouraged increasing  fiber to a daily intake of 10-25g/day, How a diet high in plant sterols (fruits/vegetables/nuts/whole grains/legumes) may reduce your cholesterol. Assessment:: Uncontrolled Drug: Atorvastatin 68m QD Assessment: Appropriate, Query Effectiveness Additional Info: Atorvastatin is currently on hold per PCP due to taking an antiviral medication. Pt to resume atorvastatin on 01/16/22 HC Follow up: N/A Pharmacist Follow up: Lipid panel, LFTs, s/s rhabdo  Osteopenia or Osteoporosis Current T-score: DualFemur Total Left -1.9,  DualFemur Total Mean -1.8,  Left Forearm Radius -1.4 taken on: 12/01/2021 Previous T-score: N/A Current Vitamin D 25-OH: 54 taken on: 08/24/2021 Previous Vitamin D 25-OH: 53 taken on: 05/13/2021 Assess this condition today?: Yes Patient has: Osteopenia In the past 12 months, have you fallen?: Yes What were the circumstances?: One fall involved tripping up some stairs. Other falls due to intoxication. Pt Shuffles feet more when walking How many times have you fallen?: 3-4 times Are there any stairs in or around the home?: Yes Are there any stairs with handrails?: Yes Is the home free of loose throw rugs in walkways, pet beds, electrical cords, etc.?: Yes Is there adequate lighting in your home to reduce the risk of falls?: Yes FRAX score - Hip (if not on treatment): 4.2% FRAX score - Major Fracture (if not on treatment): 13.8% Dietary calcium intake: Juices with fortified calcium yogurts, muscle milk Risk Factors: Alcohol Use We discussed: Weight bearing exercises (walking, light weights, resistance training), Dietary calcium intake, Fall prevention Assessment:: Controlled Drug: Vitamin D 5000 IU QD Assessment: Appropriate, Effective, Safe, Accessible Additional Info: Pt is a candidate for bisphosphonate due  to FRAX hip score >3% and diagnosis of osteopenia HC Follow up: Fall assessment March Pharmacist Follow up: DEXA scan due 11/2023. Monitor for risk of  falls  Alcohol abuse with Alcohol induced disorder  Pertinent Labs: B1 (08/25/21): 52 B12 (08/25/21): 580 VitD (08/25/21): 54 Alk Phos (08/25/21): 63 AST (08/25/21): 22 ALT (08/25/21): 24  Other Information: Pt has tapered alcohol consumption down to 2 glasses of wine per night (50% adherent). Pt has continued to increase Boost and Muscle Milk consumption Assessment:: Uncontrolled Drug: None HC Follow up: N/A Pharmacist Follow up: Encouraged patient to continue tapering down on alcohol consumption. Assess electrolyte and vitamin levels  Exercise, Diet and Non-Drug Coordination Needs Additional exercise counseling points. We discussed: decreasing sedentary behavior, incorporating flexibility, balance, and strength training exercises, targeting at least 150 minutes per week of moderate-intensity aerobic exercise. Additional diet counseling points. We discussed: aiming to consume at least 8 cups of water day Discussed Non-Drug Care Coordination Needs: Yes Does Patient have Medication financial barriers?: No  Accountable Health Communities Health-Related Social Needs Screening Tool -  SDOH  ([email or web address]) What is your living situation today? (ref #1): I have a steady place to live Think about the place you live. Do you have problems with any of the following? (ref #2): None of the above Within the past 12 months, you worried that your food would run out before you got money to buy more (ref #3): Never true Within the past 12 months, the food you bought just didn't last and you didn't have money to get more (ref #4): Never true In the past 12 months, has lack of reliable transportation kept you from medical appointments, meetings, work or from getting things needed for daily living? (ref #5): No In the past 12 months, has the electric, gas, oil, or water company threatened to shut off services in your home? (ref #6): No How often does  anyone, including family and friends, physically hurt you? (ref #7): Never (1) How often does anyone, including family and friends, insult or talk down to you? (ref #8): Never (1) How often does anyone, including friends and family, threaten you with harm? (ref #9): Never (1) How often does anyone, including family and friends, scream or curse at you? (ref #10): Never (1)  Engagement Notes Newton Pigg on 01/08/2022 10:17 AM CPP Chart Review: 15 min CPP Office Visit: 26 min CPP Office Visit Documentation: 50 min CPP Coordination of Care: [facility] Care Plan Completion: CPP Care Plan Review: 12 min Enagement Notes Newton Pigg on 01/08/2022 10:15 AM HC F/u: PHQ-9 assessment April Fall assessment March   CPP F/u: 01/13/22: Ok [facility] w/ [facility] 05/17/22: Ok [facility] w/ [facility] 07/06/22 @ 10am: CCM follow up phone (Alcohol use, falls? LDL)   Care Gaps: Sh[facility]rix: Pt will complete at [facility] [place]. Jeannett Senior, PharmD  Clinical Pharmacist  Audia [email or web address]  [phone]

## 2022-01-25 DIAGNOSIS — E559 Vitamin D deficiency, unspecified: Secondary | ICD-10-CM | POA: Diagnosis not present

## 2022-01-25 DIAGNOSIS — E519 Thiamine deficiency, unspecified: Secondary | ICD-10-CM | POA: Diagnosis not present

## 2022-01-25 DIAGNOSIS — E538 Deficiency of other specified B group vitamins: Secondary | ICD-10-CM | POA: Diagnosis not present

## 2022-01-25 DIAGNOSIS — E782 Mixed hyperlipidemia: Secondary | ICD-10-CM | POA: Diagnosis not present

## 2022-02-01 ENCOUNTER — Encounter: Payer: Self-pay | Admitting: Adult Health

## 2022-02-03 ENCOUNTER — Encounter: Payer: Self-pay | Admitting: Adult Health

## 2022-02-03 ENCOUNTER — Other Ambulatory Visit: Payer: Self-pay

## 2022-02-03 ENCOUNTER — Ambulatory Visit (INDEPENDENT_AMBULATORY_CARE_PROVIDER_SITE_OTHER): Payer: Medicare Other | Admitting: Adult Health

## 2022-02-03 VITALS — BP 120/80 | HR 90 | Temp 97.3°F | Wt 121.0 lb

## 2022-02-03 DIAGNOSIS — R200 Anesthesia of skin: Secondary | ICD-10-CM

## 2022-02-03 DIAGNOSIS — R202 Paresthesia of skin: Secondary | ICD-10-CM | POA: Diagnosis not present

## 2022-02-03 NOTE — Progress Notes (Signed)
Assessment and Plan:  Connie Alvarado was seen today for leg pain.  Diagnoses and all orders for this visit:  Numbness or tingling Localized surrounding previous skin tear No evidence of systemic, back etiology or distal Patient reassured by exam and that would not expect this to get any worse, may continue to see improvement 12-18 months after injury Continue to reduce/limit alcohol, continue multivitamin  Further disposition pending results of labs. Discussed med's effects and SE's.   Over 30 minutes of exam, counseling, chart review, and critical decision making was performed.   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   The patient reports since she had shin injury/skin tear back in Oct 2022 has had persistent tingling surrounding the area, mildly extending down into dorsum of foot. Denies weakness or back pain, no radicular sx into toes. Notes this seems improved since started on multivitamin 5 day ago.    Lab Results  Component Value Date   [id number] 322 08/25/2021   Component     Latest Ref Rng & Units 08/25/2021  Vitamin B1 (Thiamine)     8 - 30 nmol/L 52 (H)   Lab Results  Component Value Date   TSH 1.68 01/13/2022     Past Medical History:  Diagnosis Date   Alcohol abuse    Anxiety    Depression    Hyperlipidemia    Lymphocytic colitis    Malaria    as a child    Memory loss      No Active Allergies  Current Outpatient Medications on File Prior to Visit  Medication Sig   atorvastatin (LIPITOR) 80 MG tablet Take  1 tablet  Daily for Cholesterol                              /                     TAKE 1 TABLET BY MOUTH   bismuth subsalicylate (PEPTO BISMOL) 262 MG/15ML suspension Take 30 mLs by mouth every 6 (six) hours as needed.   Budesonide ER 9 MG TB24 TAKE 1 TABLET BY MOUTH DAILY   Cholecalciferol (VITAMIN D) 125 MCG (5000 UT) CAPS Take 1 tablet by mouth daily.   Multiple Vitamin (MULTIVITAMIN ADULT PO) Take 3,000 Units by mouth. Life Extensions   Pancrelipase, Lip-Prot-Amyl, (CREON) 24000-76000 units CPEP One with meals and one with snacks   vitamin B-12 (CYANOCOBALAMIN) 500 MCG tablet Take 500 mcg by mouth daily. (Patient not taking: Reported on 02/03/2022)   vitamin C (ASCORBIC ACID) 500 MG tablet Take 500 mg by mouth daily. (Patient not taking: Reported on 02/03/2022)   Zinc 50 MG TABS Take 1 tablet by mouth daily. (Patient not taking: Reported on 02/03/2022)   No current facility-administered medications on file prior to visit.    ROS: all negative except  above.   Physical Exam:  BP 120/80    Pulse 90    Temp (!) 97.3 F (36.3 C)    Wt 121 lb (54.9 kg)    SpO2 96%    BMI 21.43 kg/m   General Appearance: Well nourished, in no apparent distress. Eyes: PERRLA, conjunctiva no swelling or erythema ENT/Mouth: mask in place; Hearing normal.  Neck: Supple, thyroid normal.  Respiratory: Respiratory effort normal, BS equal bilaterally without rales, rhonchi, wheezing or stridor.  Cardio: RRR with no MRGs. Brisk peripheral pulses without edema.  Lymphatics: Non tender without lymphadenopathy.  Musculoskeletal: no obvious deformity; normal gait.  Skin: well healed scar to left anterior ankle Neuro: Normal muscle tone, intact strength throughout bil lower extremities. Sensation dulled to monofilament approx 5 cm area surrounding healed skin tear,  intact through foot.  Psych: Awake and oriented X 3, poor short term memory, normal affect, Insight and Judgment poor.    Izora Ribas, NP 2:15 PM [facility]

## 2022-02-08 DIAGNOSIS — L821 Other seborrheic keratosis: Secondary | ICD-10-CM | POA: Diagnosis not present

## 2022-02-08 DIAGNOSIS — I8311 Varicose veins of right lower extremity with inflammation: Secondary | ICD-10-CM | POA: Diagnosis not present

## 2022-02-08 DIAGNOSIS — I8312 Varicose veins of left lower extremity with inflammation: Secondary | ICD-10-CM | POA: Diagnosis not present

## 2022-02-08 DIAGNOSIS — Z85828 Personal history of other malignant neoplasm of skin: Secondary | ICD-10-CM | POA: Diagnosis not present

## 2022-02-08 DIAGNOSIS — I872 Venous insufficiency (chronic) (peripheral): Secondary | ICD-10-CM | POA: Diagnosis not present

## 2022-02-08 DIAGNOSIS — L720 Epidermal cyst: Secondary | ICD-10-CM | POA: Diagnosis not present

## 2022-02-08 DIAGNOSIS — D1801 Hemangioma of skin and subcutaneous tissue: Secondary | ICD-10-CM | POA: Diagnosis not present

## 2022-02-08 DIAGNOSIS — D485 Neoplasm of uncertain behavior of skin: Secondary | ICD-10-CM | POA: Diagnosis not present

## 2022-02-08 DIAGNOSIS — D225 Melanocytic nevi of trunk: Secondary | ICD-10-CM | POA: Diagnosis not present

## 2022-02-08 DIAGNOSIS — D692 Other nonthrombocytopenic purpura: Secondary | ICD-10-CM | POA: Diagnosis not present

## 2022-02-08 DIAGNOSIS — L723 Sebaceous cyst: Secondary | ICD-10-CM | POA: Diagnosis not present

## 2022-02-16 ENCOUNTER — Encounter: Payer: Self-pay | Admitting: Cardiovascular Disease

## 2022-02-16 MED ORDER — AMOXICILLIN 500 MG PO TABS
2000.0000 mg | ORAL_TABLET | Freq: Once | ORAL | 2 refills | Status: AC
Start: 2022-02-16 — End: 2022-02-16

## 2022-02-24 ENCOUNTER — Other Ambulatory Visit: Payer: Self-pay | Admitting: Gastroenterology

## 2022-02-24 DIAGNOSIS — K52832 Lymphocytic colitis: Secondary | ICD-10-CM

## 2022-02-24 DIAGNOSIS — R197 Diarrhea, unspecified: Secondary | ICD-10-CM

## 2022-03-16 ENCOUNTER — Telehealth: Payer: Self-pay

## 2022-03-16 NOTE — Telephone Encounter (Signed)
LM-03/16/22-Chart review completed. Called and spoke with patients daughter "[name]" for a fall risk review call.  ? ?Total time spent: 23 Min. ?

## 2022-04-08 ENCOUNTER — Telehealth: Payer: Self-pay

## 2022-04-08 NOTE — Telephone Encounter (Signed)
LM-04/08/22-Completed chart review for anxiety review call. Unable to reach patient. [place]. ? ?Total time spent: 10 min. ?

## 2022-04-13 NOTE — Telephone Encounter (Signed)
LM-04/13/22-Called pt. at [phone] to complete PHQ-9. Unable to reach pt. [place] X2. ? ?Total time spent: 2 min. ?

## 2022-04-15 ENCOUNTER — Ambulatory Visit (INDEPENDENT_AMBULATORY_CARE_PROVIDER_SITE_OTHER): Payer: Medicare Other | Admitting: Adult Health

## 2022-04-15 ENCOUNTER — Encounter: Payer: Self-pay | Admitting: Adult Health

## 2022-04-15 VITALS — BP 128/72 | HR 71 | Temp 97.3°F | Wt 120.0 lb

## 2022-04-15 DIAGNOSIS — M545 Low back pain, unspecified: Secondary | ICD-10-CM

## 2022-04-15 DIAGNOSIS — S39012A Strain of muscle, fascia and tendon of lower back, initial encounter: Secondary | ICD-10-CM

## 2022-04-15 NOTE — Patient Instructions (Signed)
? ?Acute Back Pain, Adult ?Acute back pain is sudden and usually short-lived. It is often caused by an injury to the muscles and tissues in the back. The injury may result from: ?A muscle, tendon, or ligament getting overstretched or torn. Ligaments are tissues that connect bones to each other. Lifting something improperly can cause a back strain. ?Wear and tear (degeneration) of the spinal disks. Spinal disks are circular tissue that provide cushioning between the bones of the spine (vertebrae). ?Twisting motions, such as while playing sports or doing yard work. ?A hit to the back. ?Arthritis. ?You may have a physical exam, lab tests, and imaging tests to find the cause of your pain. Acute back pain usually goes away with rest and home care. ?Follow these instructions at home: ?Managing pain, stiffness, and swelling ?Take over-the-counter and prescription medicines only as told by your health care provider. Treatment may include medicines for pain and inflammation that are taken by mouth or applied to the skin, or muscle relaxants. ?Your health care provider may recommend applying ice during the first 24-48 hours after your pain starts. To do this: ?Put ice in a plastic bag. ?Place a towel between your skin and the bag. ?Leave the ice on for 20 minutes, 2-3 times a day. ?Remove the ice if your skin turns bright red. This is very important. If you cannot feel pain, heat, or cold, you have a greater risk of damage to the area. ?If directed, apply heat to the affected area as often as told by your health care provider. Use the heat source that your health care provider recommends, such as a moist heat pack or a heating pad. ?Place a towel between your skin and the heat source. ?Leave the heat on for 20-30 minutes. ?Remove the heat if your skin turns bright red. This is especially important if you are unable to feel pain, heat, or cold. You have a greater risk of getting burned. ?Activity ? ?Do not stay in bed.  Staying in bed for more than 1-2 days can delay your recovery. ?Sit up and stand up straight. Avoid leaning forward when you sit or hunching over when you stand. ?If you work at a desk, sit close to it so you do not need to lean over. Keep your chin tucked in. Keep your neck drawn back, and keep your elbows bent at a 90-degree angle (right angle). ?Sit high and close to the steering wheel when you drive. Add lower back (lumbar) support to your car seat, if needed. ?Take short walks on even surfaces as soon as you are able. Try to increase the length of time you walk each day. ?Do not sit, drive, or stand in one place for more than 30 minutes at a time. Sitting or standing for long periods of time can put stress on your back. ?Do not drive or use heavy machinery while taking prescription pain medicine. ?Use proper lifting techniques. When you bend and lift, use positions that put less stress on your back: ?[place] your knees. ?Keep the load close to your body. ?Avoid twisting. ?Exercise regularly as told by your health care provider. Exercising helps your back heal faster and helps prevent back injuries by keeping muscles strong and flexible. ?Work with a physical therapist to make a safe exercise program, as recommended by your health care provider. Do any exercises as told by your physical therapist. ?Lifestyle ?Maintain a healthy weight. Extra weight puts stress on your back and makes it difficult to have  good posture. ?Avoid activities or situations that make you feel anxious or stressed. Stress and anxiety increase muscle tension and can make back pain worse. Learn ways to manage anxiety and stress, such as through exercise. ?General instructions ?Sleep on a firm mattress in a comfortable position. Try lying on your side with your knees slightly bent. If you lie on your back, put a pillow under your knees. ?Keep your head and neck in a straight line with your spine (neutral position) when using electronic equipment  like smartphones or pads. To do this: ?Raise your smartphone or pad to look at it instead of bending your head or neck to look down. ?Put the smartphone or pad at the level of your face while looking at the screen. ?Follow your treatment plan as told by your health care provider. This may include: ?Cognitive or behavioral therapy. ?Acupuncture or massage therapy. ?Meditation or yoga. ?Contact a health care provider if: ?You have pain that is not relieved with rest or medicine. ?You have increasing pain going down into your legs or buttocks. ?Your pain does not improve after 2 weeks. ?You have pain at night. ?You lose weight without trying. ?You have a fever or chills. ?You develop nausea or vomiting. ?You develop abdominal pain. ?Get help right away if: ?You develop new bowel or bladder control problems. ?You have unusual weakness or numbness in your arms or legs. ?You feel faint. ?These symptoms may represent a serious problem that is an emergency. Do not wait to see if the symptoms will go away. Get medical help right away. Call your local emergency services (911 in the [place] Do not drive yourself to the hospital. ?Summary ?Acute back pain is sudden and usually short-lived. ?Use proper lifting techniques. When you bend and lift, use positions that put less stress on your back. ?Take over-the-counter and prescription medicines only as told by your health care provider, and apply heat or ice as told. ?This information is not intended to replace advice given to you by your health care provider. Make sure you discuss any questions you have with your health care provider. ?Document Revised: 03/06/2021 Document Reviewed: 03/06/2021 ?Elsevier Patient Education ? [place]. ? ? ? ?Low Back Sprain or Strain Rehab ?Ask your health care provider which exercises are safe for you. Do exercises exactly as told by your health care provider and adjust them as directed. It is normal to feel mild stretching, pulling,  tightness, or discomfort as you do these exercises. Stop right away if you feel sudden pain or your pain gets worse. Do not begin these exercises until told by your health care provider. ?Stretching and range-of-motion exercises ?These exercises warm up your muscles and joints and improve the movement and flexibility of your back. These exercises also help to relieve pain, numbness, and tingling. ?Lumbar rotation ? ?Lie on your back on a firm bed or the floor with your knees bent. ?Straighten your arms out to your sides so each arm forms a 90-degree angle (right angle) with a side of your body. ?Slowly move (rotate) both of your knees to one side of your body until you feel a stretch in your lower back (lumbar). Try not to let your shoulders lift off the floor. ?Hold this position for __________ seconds. ?Tense your abdominal muscles and slowly move your knees back to the starting position. ?Repeat this exercise on the other side of your body. ?Repeat __________ times. Complete this exercise __________ times a day. ?Single knee to chest ? ?  Lie on your back on a firm bed or the floor with both legs straight. ?Bend one of your knees. Use your hands to move your knee up toward your chest until you feel a gentle stretch in your lower back and buttock. ?Hold your leg in this position by holding on to the front of your knee. ?Keep your other leg as straight as possible. ?Hold this position for __________ seconds. ?Slowly return to the starting position. ?Repeat with your other leg. ?Repeat __________ times. Complete this exercise __________ times a day. ?Prone extension on elbows ? ?Lie on your abdomen on a firm bed or the floor (prone position). ?Prop yourself up on your elbows. ?Use your arms to help lift your chest up until you feel a gentle stretch in your abdomen and your lower back. ?This will place some of your body weight on your elbows. If this is uncomfortable, try stacking pillows under your chest. ?Your hips  should stay down, against the surface that you are lying on. Keep your hip and back muscles relaxed. ?Hold this position for __________ seconds. ?Slowly relax your upper body and return to the starting position.

## 2022-04-15 NOTE — Progress Notes (Signed)
Assessment and Plan: ? ?Connie Alvarado was seen today for back pain. ? ?Diagnoses and all orders for this visit: ? ?Acute bilateral low back pain without sciatica ?Strain of lumbar region, initial encounter ?Neither SI pain, negative straight leg, no bowel/bladder problems ?No midline pain or tenderness, no injury - defer any imaging at this time ?No NSAID due to oral steroid ?Can do topicals ?Advised against muscle relaxer due to her ongoing alcohol abuse and high risk falls/injury ?Can alternate ice/heat ?RICE, and exercise given ?Discussed typically self limited, 2-8 weeks ?If not better with refer to orthopedics.  ? ? ?Further disposition pending results of labs. Discussed med's effects and SE's.   ?Over 20 minutes of exam, counseling, chart review, and critical decision making was performed.  ? ?Future Appointments  ?Date Time Provider [place]  ?04/29/2022 10:35 AM [id number]  ?05/17/2022 11:00 AM [place], Kenney Houseman, NP [facility] None  ?07/06/2022 10:00 AM [place], Osa Craver, RPH [facility] None  ?08/26/2022  3:00 PM [name], NP [facility] None  ?09/06/2022  1:30 PM Penumalli, Earlean Polka, MD [facility] None  ? ? ?------------------------------------------------------------------------------------------------------------------ ? ? ?HPI ?BP 128/72   Pulse 71   Temp (!) 97.3 ?F (36.3 ?C)   Wt 120 lb (54.4 kg)   SpO2 99%   BMI 21.26 kg/m?  ?81 y.o.female with hx of alcohol abuse with falls, dementia presents for evaluation of 5 days of lower back pain accompanied by her daughter.  ? ?She reports woke up with lumbar back pain, no midline, R > L, intermittent, catching/aching. Per daughter she did a lot of bending and lifting the day previous doing laundry. Denies recent falls or other injury. Denies hx of back pain. Denies radicular sx. Reports pain resolves with rest, pain with sitting to standing, and sitting up in bed, position changes.  ? ?She has tried tylenol with limited benefit.   ? 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  ? ?Past Medical History:  ?Diagnosis Date  ? Alcohol abuse   ? Anxiety   ? Depression   ? Hyperlipidemia   ? Lymphocytic colitis   ? Malaria   ? as a child  ? Memory loss   ?  ? ?No Active Allergies ? ?Current Outpatient Medications on File Prior to Visit  ?Medication Sig  ? atorvastatin (LIPITOR) 80 MG tablet Take  1 tablet  Daily for Cholesterol                              /                     TAKE 1 TABLET BY MOUTH  ? bismuth subsalicylate (PEPTO BISMOL) 262 MG/15ML suspension Take 30 mLs by mouth every 6 (six) hours as needed.  ? Budesonide ER 9 MG TB24 TAKE 1 TABLET BY MOUTH DAILY  ? Multiple Vitamin (MULTIVITAMIN ADULT PO) Take 3,000 Units by mouth. Life Extensions  ? Pancrelipase, Lip-Prot-Amyl, (CREON) 24000-76000 units CPEP One with meals and one with snacks  ? Cholecalciferol (VITAMIN D) 125 MCG (5000 UT) CAPS Take 1 tablet by mouth daily. (Patient not taking: Reported on 04/15/2022)  ? vitamin B-12 (CYANOCOBALAMIN) 500 MCG tablet Take 500 mcg by mouth daily. (Patient not taking: Reported on 02/03/2022)  ? vitamin C (ASCORBIC ACID) 500 MG tablet Take 500 mg by mouth daily. (Patient not taking: Reported on 02/03/2022)  ? Zinc 50 MG TABS Take 1  tablet by mouth daily. (Patient not taking: Reported on 02/03/2022)  ? ?No current facility-administered medications on file prior to visit.  ? ? ?ROS: all negative except above.  ? ?Physical Exam: ? ?BP 128/72   Pulse 71   Temp (!) 97.3 ?F (36.3 ?C)   Wt 120 lb (54.4 kg)   SpO2 99%   BMI 21.26 kg/m?  ? ?General Appearance: Well nourished, well dressed elder female in no apparent distress. ?Eyes: conjunctiva no swelling or erythema ?ENT/Mouth: Hearing normal.  ?Neck: Supple ?Respiratory: Respiratory effort normal, BS equal bilaterally without rales, rhonchi, wheezing or stridor.  ?Cardio: RRR with no MRGs. Brisk peripheral pulses without edema.  ?Abdomen: Soft, + BS.  Non tender, no guarding. No CVA  tenderness ?Lymphatics: Non tender without lymphadenopathy.  ?Musculoskeletal: Patient is able to ambulate well. Gait is not  Antalgic. Straight leg raising with dorsiflexion negative bilaterally for radicular symptoms. Sensory exam in the legs are normal.  Strength is normal and symmetric in arms and legs. There is not SI tenderness to palpation.  There is paraspinal muscle spasm.  There is not midline tenderness.  ROM of spine with  limited in all spheres due to pain.  ?Skin: Warm, dry without rashes, lesions, ecchymosis.  ?Neuro: Normal muscle tone, Sensation intact.  ?Psych: Awake and oriented X 3, normal affect, Insight and Judgment poor, poor short term memory  ?  ?Izora Ribas, NP ?2:53 PM ?[facility] ? ?

## 2022-04-16 ENCOUNTER — Encounter: Payer: Self-pay | Admitting: Adult Health

## 2022-04-16 ENCOUNTER — Other Ambulatory Visit: Payer: Self-pay | Admitting: Adult Health

## 2022-04-16 DIAGNOSIS — M545 Low back pain, unspecified: Secondary | ICD-10-CM

## 2022-04-16 MED ORDER — TIZANIDINE HCL 2 MG PO CAPS: ORAL_CAPSULE | ORAL | 0 refills | Status: DC

## 2022-04-19 ENCOUNTER — Ambulatory Visit
Admission: RE | Admit: 2022-04-19 | Discharge: 2022-04-19 | Disposition: A | Discharge status: Home / Self Care | Payer: Medicare Other | Source: Ambulatory Visit | Attending: Adult Health | Admitting: Adult Health

## 2022-04-19 ENCOUNTER — Encounter: Payer: Self-pay | Admitting: Adult Health

## 2022-04-19 DIAGNOSIS — M545 Low back pain, unspecified: Secondary | ICD-10-CM | POA: Diagnosis not present

## 2022-04-20 ENCOUNTER — Encounter: Payer: Self-pay | Admitting: Adult Health

## 2022-04-20 ENCOUNTER — Other Ambulatory Visit: Payer: Self-pay | Admitting: Adult Health

## 2022-04-20 DIAGNOSIS — M51369 Other intervertebral disc degeneration, lumbar region without mention of lumbar back pain or lower extremity pain: Secondary | ICD-10-CM

## 2022-04-20 DIAGNOSIS — M545 Low back pain, unspecified: Secondary | ICD-10-CM | POA: Insufficient documentation

## 2022-04-20 DIAGNOSIS — M5136 Other intervertebral disc degeneration, lumbar region: Secondary | ICD-10-CM | POA: Insufficient documentation

## 2022-04-20 MED ORDER — PREDNISONE 20 MG PO TABS: ORAL_TABLET | ORAL | 0 refills | Status: DC

## 2022-04-20 MED ORDER — GABAPENTIN 100 MG PO CAPS
100.0000 mg | ORAL_CAPSULE | Freq: Three times a day (TID) | ORAL | 2 refills | Status: DC | PRN
Start: 2022-04-20 — End: 2022-07-12

## 2022-04-20 NOTE — Telephone Encounter (Signed)
LM-04/20/22-Called pt. At [phone] to complete PHQ-9 assessment. Unable to reach pt. 3rd attempt calling. LVM X3. ? ?Total time spent: 2 min. ?

## 2022-04-26 ENCOUNTER — Other Ambulatory Visit: Payer: Self-pay | Admitting: Orthopedic Surgery

## 2022-04-26 DIAGNOSIS — M545 Low back pain, unspecified: Secondary | ICD-10-CM

## 2022-04-26 DIAGNOSIS — M5451 Vertebrogenic low back pain: Secondary | ICD-10-CM | POA: Diagnosis not present

## 2022-04-26 DIAGNOSIS — M546 Pain in thoracic spine: Secondary | ICD-10-CM | POA: Diagnosis not present

## 2022-04-27 ENCOUNTER — Emergency Department (HOSPITAL_COMMUNITY)
Admission: EM | Admit: 2022-04-27 | Discharge: 2022-04-27 | Discharge status: Against Medical Advice | Payer: Medicare Other | Attending: Emergency Medicine | Admitting: Emergency Medicine

## 2022-04-27 DIAGNOSIS — Z5321 Procedure and treatment not carried out due to patient leaving prior to being seen by health care provider: Secondary | ICD-10-CM | POA: Diagnosis not present

## 2022-04-27 DIAGNOSIS — M545 Low back pain, unspecified: Secondary | ICD-10-CM | POA: Diagnosis not present

## 2022-04-27 NOTE — ED Triage Notes (Signed)
Patient states she  has been having low back pain, but patient took Robaxin 2 tabs prior to coming and is not having pain at this time. ? ?Patient decided that she was going to wait and go to her ortho physician and have them schedule her for an MRI. ?

## 2022-04-28 ENCOUNTER — Ambulatory Visit
Admission: RE | Admit: 2022-04-28 | Discharge: 2022-04-28 | Disposition: A | Discharge status: Home / Self Care | Payer: Medicare Other | Source: Ambulatory Visit | Attending: Orthopedic Surgery | Admitting: Orthopedic Surgery

## 2022-04-28 DIAGNOSIS — M545 Low back pain, unspecified: Secondary | ICD-10-CM

## 2022-04-28 DIAGNOSIS — M40204 Unspecified kyphosis, thoracic region: Secondary | ICD-10-CM | POA: Diagnosis not present

## 2022-04-28 DIAGNOSIS — M4316 Spondylolisthesis, lumbar region: Secondary | ICD-10-CM | POA: Diagnosis not present

## 2022-04-28 DIAGNOSIS — M48061 Spinal stenosis, lumbar region without neurogenic claudication: Secondary | ICD-10-CM | POA: Diagnosis not present

## 2022-04-29 ENCOUNTER — Ambulatory Visit (HOSPITAL_COMMUNITY): Payer: Medicare Other | Attending: Cardiology

## 2022-04-29 ENCOUNTER — Emergency Department (HOSPITAL_COMMUNITY): Payer: Medicare Other

## 2022-04-29 ENCOUNTER — Emergency Department (HOSPITAL_COMMUNITY)
Admission: EM | Admit: 2022-04-29 | Discharge: 2022-04-29 | Disposition: A | Discharge status: Home / Self Care | Payer: Medicare Other | Attending: Emergency Medicine | Admitting: Emergency Medicine

## 2022-04-29 ENCOUNTER — Encounter (HOSPITAL_COMMUNITY): Payer: Self-pay

## 2022-04-29 ENCOUNTER — Other Ambulatory Visit: Payer: Self-pay

## 2022-04-29 DIAGNOSIS — M545 Low back pain, unspecified: Secondary | ICD-10-CM | POA: Insufficient documentation

## 2022-04-29 DIAGNOSIS — W228XXA Striking against or struck by other objects, initial encounter: Secondary | ICD-10-CM | POA: Diagnosis not present

## 2022-04-29 DIAGNOSIS — R600 Localized edema: Secondary | ICD-10-CM

## 2022-04-29 DIAGNOSIS — M19042 Primary osteoarthritis, left hand: Secondary | ICD-10-CM | POA: Diagnosis not present

## 2022-04-29 DIAGNOSIS — Z0389 Encounter for observation for other suspected diseases and conditions ruled out: Secondary | ICD-10-CM | POA: Diagnosis not present

## 2022-04-29 DIAGNOSIS — M25571 Pain in right ankle and joints of right foot: Secondary | ICD-10-CM | POA: Diagnosis not present

## 2022-04-29 DIAGNOSIS — R9431 Abnormal electrocardiogram [ECG] [EKG]: Secondary | ICD-10-CM | POA: Diagnosis not present

## 2022-04-29 DIAGNOSIS — S299XXA Unspecified injury of thorax, initial encounter: Secondary | ICD-10-CM | POA: Diagnosis present

## 2022-04-29 DIAGNOSIS — S60222A Contusion of left hand, initial encounter: Secondary | ICD-10-CM | POA: Diagnosis not present

## 2022-04-29 DIAGNOSIS — S22000A Wedge compression fracture of unspecified thoracic vertebra, initial encounter for closed fracture: Secondary | ICD-10-CM | POA: Insufficient documentation

## 2022-04-29 DIAGNOSIS — S9001XA Contusion of right ankle, initial encounter: Secondary | ICD-10-CM

## 2022-04-29 LAB — URINALYSIS, ROUTINE W REFLEX MICROSCOPIC
Bilirubin Urine: NEGATIVE
Glucose, UA: NEGATIVE mg/dL
Hgb urine dipstick: NEGATIVE
Ketones, ur: NEGATIVE mg/dL
Leukocytes,Ua: NEGATIVE
Nitrite: NEGATIVE
Protein, ur: NEGATIVE mg/dL
Specific Gravity, Urine: 1.015 (ref 1.005–1.030)
pH: 6 (ref 5.0–8.0)

## 2022-04-29 LAB — COMPREHENSIVE METABOLIC PANEL
ALT: 24 U/L (ref 0–44)
AST: 25 U/L (ref 15–41)
Albumin: 3.2 g/dL — ABNORMAL LOW (ref 3.5–5.0)
Alkaline Phosphatase: 53 U/L (ref 38–126)
Anion gap: 7 (ref 5–15)
BUN: 15 mg/dL (ref 8–23)
CO2: 24 mmol/L (ref 22–32)
Calcium: 8.8 mg/dL — ABNORMAL LOW (ref 8.9–10.3)
Chloride: 109 mmol/L (ref 98–111)
Creatinine, Ser: 0.66 mg/dL (ref 0.44–1.00)
GFR, Estimated: >60 mL/min (ref >60)
Glucose, Bld: 95 mg/dL (ref 70–99)
Potassium: 3.8 mmol/L (ref 3.5–5.1)
Sodium: 140 mmol/L (ref 135–145)
Total Bilirubin: 0.8 mg/dL (ref 0.3–1.2)
Total Protein: 6 g/dL — ABNORMAL LOW (ref 6.5–8.1)

## 2022-04-29 LAB — CBC WITH DIFFERENTIAL/PLATELET
Abs Immature Granulocytes: 0.03 10*3/uL (ref 0.00–0.07)
Basophils Absolute: 0 10*3/uL (ref 0.0–0.1)
Basophils Relative: 1 %
Eosinophils Absolute: 0.1 10*3/uL (ref 0.0–0.5)
Eosinophils Relative: 2 %
HCT: 38.5 % (ref 36.0–46.0)
Hemoglobin: 12.6 g/dL (ref 12.0–15.0)
Immature Granulocytes: 0 %
Lymphocytes Relative: 16 %
Lymphs Abs: 1.3 10*3/uL (ref 0.7–4.0)
MCH: 31.8 pg (ref 26.0–34.0)
MCHC: 32.7 g/dL (ref 30.0–36.0)
MCV: 97.2 fL (ref 80.0–100.0)
Monocytes Absolute: 0.6 10*3/uL (ref 0.1–1.0)
Monocytes Relative: 8 %
Neutro Abs: 6 10*3/uL (ref 1.7–7.7)
Neutrophils Relative %: 73 %
Platelets: 127 10*3/uL — ABNORMAL LOW (ref 150–400)
RBC: 3.96 MIL/uL (ref 3.87–5.11)
RDW: 13.8 % (ref 11.5–15.5)
WBC: 8.2 10*3/uL (ref 4.0–10.5)
nRBC: 0 % (ref 0.0–0.2)

## 2022-04-29 MED ORDER — METRONIDAZOLE 500 MG/100ML IV SOLN: 500.0000 mg | Freq: Once | INTRAVENOUS | Status: DC

## 2022-04-29 MED ORDER — LACTATED RINGERS IV SOLN: INTRAVENOUS | Status: DC

## 2022-04-29 MED ORDER — LACTATED RINGERS IV BOLUS (SEPSIS): 1000.0000 mL | Freq: Once | INTRAVENOUS | Status: DC

## 2022-04-29 MED ORDER — VANCOMYCIN HCL IN DEXTROSE 1-5 GM/200ML-% IV SOLN: 1000.0000 mg | Freq: Once | INTRAVENOUS | Status: DC

## 2022-04-29 MED ORDER — TETANUS-DIPHTH-ACELL PERTUSSIS 5-2.5-18.5 LF-MCG/0.5 IM SUSY: 0.5000 mL | PREFILLED_SYRINGE | Freq: Once | INTRAMUSCULAR | Status: DC

## 2022-04-29 MED ORDER — TRAMADOL HCL 50 MG PO TABS: 50.0000 mg | ORAL_TABLET | Freq: Four times a day (QID) | ORAL | 0 refills | Status: DC | PRN

## 2022-04-29 MED ORDER — SODIUM CHLORIDE 0.9 % IV SOLN: 2.0000 g | Freq: Once | INTRAVENOUS | Status: DC

## 2022-04-29 NOTE — Progress Notes (Signed)
A consult was received from an ED physician for vancomycin and cefepime per pharmacy dosing (for an indication other than meningitis). The patient's profile has been reviewed for ht/wt/allergies/indication/available labs. A one time order has been placed for the above antibiotics.  Further antibiotics/pharmacy consults should be ordered by admitting physician if indicated.       ?                ?Reuel Boom, PharmD, BCPS ?[phone] ?04/29/2022, 7:42 AM ? ?

## 2022-04-29 NOTE — ED Provider Notes (Signed)
?[facility] DEPT ?Provider Note ? ? ?CSN: [id number] ?Arrival date & time: 04/29/22  0630 ? ?  ? ?History ? ?Chief Complaint  ?Patient presents with  ? Back Pain  ? ? ?Connie Alvarado is a 81 y.o. female. ? ?Pt complains of pain in her low back.  Pt's daughter reports no relief with tylenol  Pt had an mri yesterday.  Pt reports she hit her right ankle with her left ankle and her ankle is swollen and bruised.  Pt's daughter is concerned because pt's lower legs have been swelling on and off.  Legs are swollen today but more swollen yesterday after having MRi.   ? ?The history is provided by the patient. No language interpreter was used.  ?Back Pain ?Location:  Thoracic spine and lumbar spine ?Quality:  Aching ?Radiates to:  Does not radiate ?Pain severity:  Moderate ?Pain is:  Same all the time ?Onset quality:  Gradual ?Duration:  1 month ?Timing:  Constant ?Progression:  Worsening ?Context: not falling and not lifting heavy objects   ?Relieved by:  Nothing ?Worsened by:  Nothing ?Ineffective treatments:  None tried ?Associated symptoms: no abdominal pain   ? ?  ? ?Home Medications ?Prior to Admission medications   ?Medication Sig Start Date End Date Taking? Authorizing Provider  ?predniSONE (DELTASONE) 20 MG tablet 2 tablets daily for 3 days, 1 tablet daily for 4 days. 04/20/22   [name], NP  ?traMADol (ULTRAM) 50 MG tablet Take 1 tablet (50 mg total) by mouth every 6 (six) hours as needed. 04/29/22 04/29/23 Yes Fransico Meadow, PA-C  ?atorvastatin (LIPITOR) 80 MG tablet Take  1 tablet  Daily for Cholesterol                              /                     TAKE 1 TABLET BY MOUTH 11/01/21   Unk Pinto, MD  ?bismuth subsalicylate (PEPTO BISMOL) 262 MG/15ML suspension Take 30 mLs by mouth every 6 (six) hours as needed.    [provider]  ?Budesonide ER 9 MG TB24 TAKE 1 TABLET BY MOUTH DAILY 02/24/22   Thornton Park, MD  ?Cholecalciferol (VITAMIN D) 125 MCG (5000 UT) CAPS  Take 1 tablet by mouth daily. ?Patient not taking: Reported on 04/15/2022    [provider]  ?gabapentin (NEURONTIN) 100 MG capsule Take 1-3 capsules (100-300 mg total) by mouth 3 (three) times daily as needed. For back pain. 04/20/22 04/20/23  [name], NP  ?Multiple Vitamin (MULTIVITAMIN ADULT PO) Take 3,000 Units by mouth. [employer]    [provider]  ?Pancrelipase, Lip-Prot-Amyl, (CREON) 97989-21194 units CPEP One with meals and one with snacks 12/03/21   Thornton Park, MD  ?   ? ?Allergies    ?Tizanidine   ? ?Review of Systems   ?Review of Systems  ?Gastrointestinal:  Negative for abdominal pain.  ?Musculoskeletal:  Positive for back pain and myalgias.  ?All other systems reviewed and are negative. ? ?Physical Exam ?Updated Vital Signs ?BP (!) 171/84   Pulse 67   Temp 97.9 ?F (36.6 ?C) (Oral)   Resp 14   Ht '5\' 3"'$  (1.6 m)   Wt 54.4 kg   SpO2 99%   BMI 21.26 kg/m?  ?Physical Exam ?Vitals and nursing note reviewed.  ?Constitutional:   ?   Appearance: She is well-developed.  ?HENT:  ?  Head: Normocephalic.  ?   Mouth/Throat:  ?   Mouth: Mucous membranes are moist.  ?Cardiovascular:  ?   Rate and Rhythm: Normal rate.  ?   Pulses: Normal pulses.  ?   Heart sounds: Normal heart sounds.  ?Pulmonary:  ?   Effort: Pulmonary effort is normal.  ?Abdominal:  ?   General: Abdomen is flat. There is no distension.  ?Musculoskeletal:     ?   General: Swelling and tenderness present.  ?   Cervical back: Normal range of motion.  ?Skin: ?   General: Skin is warm.  ?Neurological:  ?   General: No focal deficit present.  ?   Mental Status: She is alert and oriented to person, place, and time.  ?Psychiatric:     ?   Mood and Affect: Mood normal.  ? ? ?ED Results / Procedures / Treatments   ?Labs ?(all labs ordered are listed, but only abnormal results are displayed) ?Labs Reviewed  ?CBC WITH DIFFERENTIAL/PLATELET - Abnormal; Notable for the following components:  ?    Result Value  ? Platelets  127 (*)   ? All other components within normal limits  ?COMPREHENSIVE METABOLIC PANEL - Abnormal; Notable for the following components:  ? Calcium 8.8 (*)   ? Total Protein 6.0 (*)   ? Albumin 3.2 (*)   ? All other components within normal limits  ?URINALYSIS, ROUTINE W REFLEX MICROSCOPIC  ? ? ?EKG ?None ? ?Radiology ?DG Ankle Complete Right ? ?Result Date: 04/29/2022 ?CLINICAL DATA:  Pain. EXAM: RIGHT ANKLE - COMPLETE 3+ VIEW COMPARISON:  None Available. FINDINGS: There is no evidence of fracture, dislocation, or joint effusion. There is no evidence of arthropathy or other focal bone abnormality. Soft tissues are unremarkable. IMPRESSION: Negative. Electronically Signed   By: Margaretha Sheffield M.D.   On: 04/29/2022 11:41  ? ?MR THORACIC SPINE WO CONTRAST ? ?Result Date: 04/29/2022 ?CLINICAL DATA:  Back spasm for several months EXAM: MRI THORACIC AND LUMBAR SPINE WITHOUT CONTRAST TECHNIQUE: Multiplanar and multiecho pulse sequences of the thoracic and lumbar spine were obtained without intravenous contrast. COMPARISON:  Thoracic special lumbar radiography 04/19/2022 FINDINGS: MRI THORACIC SPINE FINDINGS Alignment:  Exaggerated thoracic kyphosis.  Dextroscoliosis. Vertebrae: Marrow edema surrounds a horizontal fracture plane at following the T10 superior endplate. No significant height loss. Remote T12 compression fracture with mild height loss. No evidence of bone lesion or discitis Cord:  Normal signal and morphology. Paraspinal and other soft tissues: No perispinal hematoma or masslike finding. Atrophy of intrinsic back muscles Disc levels: Ordinary midthoracic disc space narrowing and endplate spurring for age. 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 MRI LUMBAR SPINE FINDINGS Segmentation:  Standard. Alignment:  Degenerative grade 1 anterolisthesis at L4-5 Vertebrae:  No fracture, evidence of discitis, or bone lesion. Conus medullaris and cauda equina: Conus extends to the L1 level. Conus and cauda equina appear normal.  Paraspinal and other soft tissues: Left renal cystic intensity. Disc levels: L2-L3: Mild disc bulging and ligamentous thickening L3-L4: Mild disc bulging. Ligamentum flavum thickening and mild facet spurring. Moderate narrowing of the thecal sac L4-L5: Facet osteoarthritis with spurring and ligamentum flavum thickening. Mild anterolisthesis. Circumferential disc bulging with moderate to advanced spinal stenosis. Right more than left L5 impingement at the subarticular recesses L5-S1:Degenerative disc collapse with left paracentral downward pointing extrusion impinging on the left S1 and S2 nerve roots. Facet spurring narrows the foramina without high-grade stenosis. IMPRESSION: MR THORACIC SPINE IMPRESSION 1. Subacute T10 compression fracture without retropulsion or significant height loss.  2. Remote T12 compression fracture which is healed. MR LUMBAR SPINE IMPRESSION 1. No acute finding. 2. 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 3. L5-S1 left paracentral extrusion with left S1 and S2 compression. 4. 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 Right more than left L5 impingement at the L4-5 subarticular recesses. Electronically Signed   By: Jorje Guild M.D.   On: 04/29/2022 04:51  ? ?MR LUMBAR SPINE WO CONTRAST ? ?Result Date: 04/29/2022 ?CLINICAL DATA:  Back spasm for several months EXAM: MRI THORACIC AND LUMBAR SPINE WITHOUT CONTRAST TECHNIQUE: Multiplanar and multiecho pulse sequences of the thoracic and lumbar spine were obtained without intravenous contrast. COMPARISON:  Thoracic special lumbar radiography 04/19/2022 FINDINGS: MRI THORACIC SPINE FINDINGS Alignment:  Exaggerated thoracic kyphosis.  Dextroscoliosis. Vertebrae: Marrow edema surrounds a horizontal fracture plane at following the T10 superior endplate. No significant height loss. Remote T12 compression fracture with mild height loss. No evidence of bone lesion or discitis Cord:  Normal signal and  morphology. Paraspinal and other soft tissues: No perispinal hematoma or masslike finding. Atrophy of intrinsic back muscles Disc levels: Ordinary midthoracic disc space narrowing and endplate spurring for age. Int

## 2022-04-29 NOTE — Sepsis Progress Note (Signed)
Sepsis protocol monitored by [employer] 

## 2022-04-29 NOTE — ED Triage Notes (Addendum)
Pt reports with lower back pain that has been going on for 2 weeks.  ?

## 2022-04-30 DIAGNOSIS — M546 Pain in thoracic spine: Secondary | ICD-10-CM | POA: Diagnosis not present

## 2022-05-02 ENCOUNTER — Other Ambulatory Visit: Payer: Medicare Other

## 2022-05-02 ENCOUNTER — Inpatient Hospital Stay: Admission: RE | Admit: 2022-05-02 | Payer: Medicare Other | Source: Ambulatory Visit

## 2022-05-03 ENCOUNTER — Encounter: Payer: Self-pay | Admitting: Cardiovascular Disease

## 2022-05-03 DIAGNOSIS — Z952 Presence of prosthetic heart valve: Secondary | ICD-10-CM

## 2022-05-05 NOTE — Telephone Encounter (Signed)
Patient previous echo appointment had been finalized. A new ordered placed. Scheduling will contact patient. ?

## 2022-05-05 NOTE — Telephone Encounter (Signed)
Hey, the order is in finalized can someone reinstate it? It won't let me do it.  ?

## 2022-05-17 ENCOUNTER — Ambulatory Visit: Payer: Medicare Other | Admitting: Nurse Practitioner

## 2022-05-17 ENCOUNTER — Ambulatory Visit: Payer: Medicare Other | Admitting: Adult Health

## 2022-05-20 ENCOUNTER — Encounter: Payer: Self-pay | Admitting: Nurse Practitioner

## 2022-05-20 ENCOUNTER — Ambulatory Visit (INDEPENDENT_AMBULATORY_CARE_PROVIDER_SITE_OTHER): Payer: Medicare Other | Admitting: Nurse Practitioner

## 2022-05-20 VITALS — BP 143/83 | HR 67 | Temp 98.7°F | Wt 120.0 lb

## 2022-05-20 DIAGNOSIS — E538 Deficiency of other specified B group vitamins: Secondary | ICD-10-CM | POA: Diagnosis not present

## 2022-05-20 DIAGNOSIS — E782 Mixed hyperlipidemia: Secondary | ICD-10-CM

## 2022-05-20 DIAGNOSIS — I700 Atherosclerosis of aorta: Secondary | ICD-10-CM | POA: Diagnosis not present

## 2022-05-20 DIAGNOSIS — D692 Other nonthrombocytopenic purpura: Secondary | ICD-10-CM | POA: Diagnosis not present

## 2022-05-20 DIAGNOSIS — F3342 Major depressive disorder, recurrent, in full remission: Secondary | ICD-10-CM

## 2022-05-20 DIAGNOSIS — Z952 Presence of prosthetic heart valve: Secondary | ICD-10-CM

## 2022-05-20 DIAGNOSIS — E559 Vitamin D deficiency, unspecified: Secondary | ICD-10-CM | POA: Diagnosis not present

## 2022-05-20 DIAGNOSIS — F1019 Alcohol abuse with unspecified alcohol-induced disorder: Secondary | ICD-10-CM | POA: Diagnosis not present

## 2022-05-20 DIAGNOSIS — H35039 Hypertensive retinopathy, unspecified eye: Secondary | ICD-10-CM

## 2022-05-20 DIAGNOSIS — J439 Emphysema, unspecified: Secondary | ICD-10-CM

## 2022-05-20 DIAGNOSIS — M810 Age-related osteoporosis without current pathological fracture: Secondary | ICD-10-CM

## 2022-05-20 DIAGNOSIS — Z0000 Encounter for general adult medical examination without abnormal findings: Secondary | ICD-10-CM

## 2022-05-20 DIAGNOSIS — Z79899 Other long term (current) drug therapy: Secondary | ICD-10-CM

## 2022-05-20 NOTE — Progress Notes (Incomplete)
MEDICARE WELLNESS  Assessment and Plan:   Encounter for Medicare annual wellness exam 1 year Schedule DEXA with mammogram - phone number given Pneumonia vaccine next visit   Atherosclerosis of aorta ([place]) Per CT 05/2020 Control blood pressure, cholesterol, glucose, increase exercise.   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   H/O prosthetic aortic valve replacement Continue follow up cardio  Medication management -     CBC with Differential/Platelet -     COMPLETE METABOLIC PANEL WITH GFR -     Magnesium  Vitamin D deficiency -     VITAMIN D 25 Hydroxy (Vit-D Deficiency, Fractures)  Anemia, unspecified type -     CBC with Differential/Platelet  Emphysema (HCC) Per imaging, remote smoker, denies sx; monitor  Lung nodules 06/10/2020, had CT that showed stable 0.6cm left lower lobe 1 year follow up if high risk, does have smoking history, will repeat  Lymphocytic colitis Stop drinking, improved with budesonide; continue to follow up GI  Memory loss Has seen neuro Dr. Leta Baptist 29/30 on MMSE, mood is improved, has reduced alcohol, on B complex with improved labs Daughter feels memory is progressive; repeatedly asking the same questions; given neuro number to schedule follow up per her preference.   High fall risk Discussed and will refer to PT for gait training with established ortho PT She has done well reducing alcohol Fall  prevention in home discussed   Infected wound No discharge to culture but appears worse despite kefex, will swich to doxycycline; continue close monitoring and daily dressing changes Daughter monitoring closely; follow up if not improving or any worsening sx.  Echocardiogram -   Discussed med's effects and SE's. Screening labs and tests as requested with regular follow-up as recommended. Over 40 minutes of exam, counseling, chart review, and complex, high level critical decision making was performed this visit.   Future Appointments  Date Time Provider [place]  05/26/2022 12:45 PM [facility] ECHO 3 [id number]  07/06/2022 10:00 AM Belton, Osa Craver, RPH [facility] None  08/26/2022  3:00 PM Darrol Jump, NP [facility] None  09/06/2022  1:30 PM Penumalli, Earlean Polka, MD [facility] None     Plan:   During the course of the visit the patient was educated and counseled about appropriate screening and preventive services including:   Pneumococcal vaccine  Prevnar 13 Influenza vaccine Td vaccine Screening electrocardiogram Bone densitometry screening Colorectal cancer screening Diabetes screening Glaucoma screening Nutrition counseling  Advanced directives: requested   HPI   81 y.o. female  presents to clinic accompanied by daughter for medicare wellness visit and follow up for has Alcohol abuse with alcohol-induced disorder ([place]); Medication management; Mixed hyperlipidemia; H/O prosthetic aortic valve replacement; B12 deficiency; Vitamin D deficiency; Major depression in complete remission ([employer]); Lung nodules; Lymphocytic colitis; Senile purpura ([place]); Aortic atherosclerosis ([place]) - CT 05/2020; Emphysema of lung ([place]) - CT 05/2020; Dementia ([place]); Thiamine deficiency; At high risk for falls; Osteopenia; DDD (degenerative disc disease), lumbar; and Lumbar back pain on their problem list.   Patient presents today  accompanied by daughter.    She was seen in the ER on  04/29/22 for back pain.  She had also reported hitting her right ankle with her left ankle and her right ankle is was swollen and bruised.  She reported intermittent BLE swelling.  MRI was completed to show a compression fracture in the back are which was the cause of presenting back pain.  Edema was noted to be from dependent.  X-ray of right ankle was negative for break, only contusion.  Patient was also provided UA d/t confusion, which was normal.  She was treated with Tramadol and released.    Prior to her visit to the ED on 04/29/22, she was also seen on 04/27/22 again for back pain.  She had been treated in office with Gabapentin and Prednisone.  X-ray prior to ER visit noted significant lumbar disc disease, shifting of vertebra, arthritis and compression type changes.  She was referred to Orthopedics.   She reports feeling better at this time and pain has been managed.   Daughter and patient are currently assessing assisted living, [facility].    She still continues to have right ankle contusion that is decreasing in size according to daughter.  She is continuing to elevated and apply ice.   She has history of anxiety, alcohol abuse, memory problems; her husband had kidney cancer and passed 2020. Moved in with her daughter, [name] due to memory concerns. She was on numerous mood meds without benefit, concern with trazodone/benzo with alcohol and was stopped, daughter is limiting alcohol recently to 1/day. Memory continues to be an issue, frequently asks the same questions.   She has seen Dr. Kathlen Mody, last visit 01/2020, normal MRI  Brain 04/2019. Last MMSE 29. 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 She also had a low B1 of 7 02/2019 and is on B complex, improved to 26 at last check 11/04/2020.  She was found to have  lymphocytic colitis on colonoscopy in 06/2019, is on budesonide and is following with GI Dr. Tarri Glenn, well controlled.  Had CT 06/10/2020 showing stable 6 mm left lower lobe nodule, and has been asked to follow up with yearly imaging, but has not continued.  She also showed emphysematous changes and extensive aortic atherosclerosis. She is remote former smoker.   BMI is There is no height or weight on file to calculate BMI., she has not been working on diet and exercise. Wt Readings from Last 3 Encounters:  04/29/22 120 lb (54.4 kg)  04/15/22 120 lb (54.4 kg)  02/03/22 121 lb (54.9 kg)   Her blood pressure has been controlled at home, today their BP is   She does not workout. She denies chest pain, shortness of breath, dizziness.   She has a history of hyperlipidemia on liptior and has history of bovine aortic valve replacement x 2, last echo was 05/2019, following with Dr. Sallyanne Kuster  Her blood pressure has been controlled at home, today their BP is    She does not workout. She denies chest pain, shortness of breath, dizziness.   She is on cholesterol medication and denies myalgias. Her cholesterol is not at goal. The cholesterol last visit was:   Lab Results  Component Value Date   CHOL 314 (H) 01/13/2022   HDL 74 01/13/2022   LDLCALC 196 (H) 01/13/2022   TRIG 248 (H) 01/13/2022  CHOLHDL 4.2 01/13/2022   Last A1C in the office was:  Lab Results  Component Value Date   HGBA1C 5.8 (H) 01/13/2022    Last GFR:  Lab Results  Component Value Date   GFRNONAA >60 04/29/2022   Patient is on Vitamin D supplement.   Lab Results  Component Value Date   VD25OH 39 01/13/2022        Current Medications:   Current Outpatient Medications (Endocrine & Metabolic):  Marland Kitchen  Budesonide ER 9 MG TB24, TAKE 1 TABLET BY MOUTH DAILY .  predniSONE (DELTASONE) 20 MG tablet, 2 tablets daily for 3 days, 1 tablet daily for 4 days.  Current Outpatient Medications (Cardiovascular):  .  atorvastatin  (LIPITOR) 80 MG tablet, Take  1 tablet  Daily for Cholesterol                              /                     TAKE 1 TABLET BY MOUTH   Current Outpatient Medications (Analgesics):  .  traMADol (ULTRAM) 50 MG tablet, Take 1 tablet (50 mg total) by mouth every 6 (six) hours as needed.   Current Outpatient Medications (Other):  .  bismuth subsalicylate (PEPTO BISMOL) 262 MG/15ML suspension, Take 30 mLs by mouth every 6 (six) hours as needed. .  Cholecalciferol (VITAMIN D) 125 MCG (5000 UT) CAPS, Take 1 tablet by mouth daily. .  Multiple Vitamin (MULTIVITAMIN ADULT PO), Take 3,000 Units by mouth. Life Extensions .  Pancrelipase, Lip-Prot-Amyl, (CREON) 24000-76000 units CPEP, One with meals and one with snacks .  gabapentin (NEURONTIN) 100 MG capsule, Take 1-3 capsules (100-300 mg total) by mouth 3 (three) times daily as needed. For back pain. (Patient not taking: Reported on 05/20/2022)  Allergies:  Allergies  Allergen Reactions  . Tizanidine     Hypotension "felt like I had a stroke"    Medical History:  She has Alcohol abuse with alcohol-induced disorder ([place]); Medication management; Mixed hyperlipidemia; H/O prosthetic aortic valve replacement; B12 deficiency; Vitamin D deficiency; Major depression in complete remission ([place]); Lung nodules; Lymphocytic colitis; Senile purpura ([place]); Aortic atherosclerosis ([place]) - CT 05/2020; Emphysema of lung ([place]) - CT 05/2020; Dementia ([place]); Thiamine deficiency; At high risk for falls; Osteopenia; DDD (degenerative disc disease), lumbar; and Lumbar back pain on their problem list.   Patient Care Team: Unk Pinto, MD as PCP - General (Internal Medicine) Croitoru, Dani Gobble, MD as PCP - Cardiology (Cardiology) Leta Baptist, Earlean Polka, MD as Consulting Physician (Neurology) Newton Pigg, [facility] as Pharmacist (Pharmacist)  Surgical History:  She has a past surgical history that includes Cardiac valuve replacement (1999) and Cardiac valuve replacement  (2016). Family History:  Herfamily history includes Breast cancer in her mother; Hyperlipidemia in her mother; Hypertension in her mother; Kidney disease in her father; Parkinson's disease in her father; Parkinsonism in her father; Sleep apnea in her daughter. Social History:  She reports that she quit smoking about 43 years ago. Her smoking use included cigarettes. She has never used smokeless tobacco. She reports current alcohol use of about 28.0 standard drinks per week. She reports that she does not use drugs.  Immunization History  Administered Date(s) Administered  . Influenza, High Dose Seasonal PF 09/28/2021  . Moderna Covid-19 Vaccine Bivalent Booster 9yr & up 11/10/2021  . PFIZER(Purple Top)SARS-COV-2 Vaccination 02/19/2020, 03/11/2020, 11/26/2020  . Pneumococcal Conjugate-13 12/18/2019  . Pneumococcal Polysaccharide-23  08/03/2021  . Td 04/26/2020   Tetanus: 04/2020 Pneumo 23: Today - patient left prior to receiving, plan with next OV Pneum 13: 11/2019 Influenza: did get 2021 at [facility] Shingrix: plans to get at pharmacy - plans to do cash pay Covid 19: 3/3, pfizer - has had booster   Colonoscopy 06/2019 Dr. Tarri Glenn, lymphocytic colitis, was recommended 5 year recall **2025 EGD 06/2019  MGM 06/2021 -- Due 2023 Dec. For Dexa 06/2022 DEXA has never had - ordered to schedule with next mammogram   Last eye: Dr. Clydene Laming, last 2021, will schedule Last dental: Dr. Oren Binet, last 03/2022 She wants to look at checkoing vit d She will need updated screenings.  MEDICARE WELLNESS OBJECTIVES: Physical activity:   Cardiac risk factors:   Depression/mood screen:      08/25/2021    3:27 PM  Depression screen PHQ 2/9  Decreased Interest 0  Down, Depressed, Hopeless 0  PHQ - 2 Score 0    ADLs:      View : No data to display.           Cognitive Testing  Alert? Yes  Normal Appearance?Yes  Oriented to person? Yes  Place? Yes   Time? Yes  Recall of three objects?  2/3  Can perform  simple calculations? Yes  Displays appropriate judgment?Yes  Can read the correct time from a watch face?Yes     08/26/2021    3:15 PM 05/14/2021   12:28 PM 04/22/2020    3:09 PM 02/06/2020   11:22 AM  MMSE - Mini Mental State Exam  Orientation to time '2 5 5 3  '$ Orientation to Place '4 5 5 4  '$ Registration '2 3 3 3  '$ Attention/ Calculation '3 5 5 4  '$ Recall '3 2 3 3  '$ Language- name 2 objects '2 2 2 2  '$ Language- repeat '1 1 1 1  '$ Language- follow 3 step command '3 3 3 3  '$ Language- read & follow direction '1 1 1 1  '$ Write a sentence '1 1 1 1  '$ Copy design '1 1 1 1  '$ Total score '23 29 30 26    '$ EOL planning:     Review of Systems: Review of Systems  Constitutional: Negative.  Negative for malaise/fatigue and weight loss.  HENT: Negative.  Negative for hearing loss and tinnitus.   Eyes: Negative.  Negative for blurred vision and double vision.  Respiratory: Negative.  Negative for cough, shortness of breath and wheezing.   Cardiovascular: Negative.  Negative for chest pain, palpitations, orthopnea, claudication and leg swelling.  Gastrointestinal: Negative.  Negative for abdominal pain, blood in stool, constipation, diarrhea, heartburn, melena, nausea and vomiting.  Genitourinary: Negative.   Musculoskeletal:  Positive for falls. Negative for joint pain and myalgias.  Skin: Negative.  Negative for rash.  Neurological: Negative.  Negative for dizziness, tingling, sensory change, weakness and headaches.  Endo/Heme/Allergies: Negative.  Negative for polydipsia.  Psychiatric/Behavioral:  Positive for memory loss (frequently asks the same questions). Negative for depression, substance abuse (only 1 drink/night) and suicidal ideas. The patient is not nervous/anxious and does not have insomnia.   All other systems reviewed and are negative.  Physical Exam: Estimated body mass index is 21.26 kg/m as calculated from the following:   Height as of 04/29/22: '5\' 3"'$  (1.6 m).   Weight as of 04/29/22: 120 lb (54.4  kg). There were no vitals taken for this visit. General Appearance: elderly frail appearing female in no apparent distress Eyes: PERRLA, EOMs, conjunctiva no swelling or  erythema Sinuses: No Frontal/maxillary tenderness ENT/Mouth: Ext aud canals clear, normal light reflex with TMs without erythema, bulging.  No erythema, swelling, or exudate on post pharynx. Tonsils not swollen or erythematous. Hearing good Neck: Supple, thyroid normal. No bruits  Respiratory: Respiratory effort normal, BS equal bilaterally without rales, rhonchi, wheezing or stridor.  Cardio: RRR with 2/6 systolic murmur without rubs or gallops.  Chest: symmetric, with normal excursions and percussion.  Abdomen: Soft, nontender, no guarding, rebound, hernias, masses, or organomegaly.  Lymphatics: Non tender without lymphadenopathy.  Musculoskeletal: Full ROM all peripheral extremities, good grip strength, non-antalgic, unsteady gait.  Skin: Warm, dry; fragile without rashes, lesions; see photo for R shin wound, mildly erythematous, mild swelling, dry yellow discharge, expresses serous discharge only, no foul odor Neuro: Cranial nerves intact,,no cog wheeling, Normal muscle tone, wide based stance,  Neg pronator drift Psych: Awake and oriented X 3, normal affect, Insight and Judgment appropriate       Medicare Attestation I have personally reviewed: The patient's medical and social history Their use of alcohol, tobacco or illicit drugs Their current medications and supplements The patient's functional ability including ADLs,fall risks, home safety risks, cognitive, and hearing and visual impairment Diet and physical activities Evidence for depression or mood disorders  The patient's weight, height, BMI, and visual acuity have been recorded in the chart.  I have made referrals, counseling, and provided education to the patient based on review of the above and I have provided the patient with a written personalized care  plan for preventive services.     Bence Trapp 12:28 PM [facility]

## 2022-05-20 NOTE — Patient Instructions (Signed)
Contusion A contusion is a deep bruise. Contusions are the result of a blunt injury to tissues and muscle fibers under the skin. The injury causes bleeding under the skin. The skin overlying the contusion may turn blue, purple, or yellow. Minor injuries will give you a painless contusion, but more severe injuries cause contusions that may stay painful and swollen for a few weeks. Follow these instructions at home: Pay attention to any changes in your symptoms. Let your health care provider know about them. Take these actions to relieve your pain. Managing pain, stiffness, and swelling  Use resting, icing, applying pressure (compression), and raising (elevating) the injured area. This is often called the RICE strategy. Rest the injured area. Return to your normal activities as told by your health care provider. Ask your health care provider what activities are safe for you. If directed, put ice on the injured area: Put ice in a plastic bag. Place a towel between your skin and the bag. Leave the ice on for 20 minutes, 2-3 times per day. If directed, apply light compression to the injured area using an elastic bandage. Make sure the bandage is not wrapped too tightly. Remove and reapply the bandage as directed by your health care provider. If possible, raise (elevate) the injured area above the level of your heart while you are sitting or lying down. General instructions Take over-the-counter and prescription medicines only as told by your health care provider. Keep all follow-up visits as told by your health care provider. This is important. Contact a health care provider if: Your symptoms do not improve after several days of treatment. Your symptoms get worse. You have difficulty moving the injured area. Get help right away if: You have severe pain. You have numbness in a hand or foot. Your hand or foot turns pale or cold. Summary A contusion is a deep bruise. Contusions are the result of a  blunt injury to tissues and muscle fibers under the skin. It is treated with rest, ice, compression, and elevation. You may be given over-the-counter medicines for pain. Contact a health care provider if your symptoms do not improve, or get worse. Get help right away if you have severe pain, have numbness, or the area turns pale or cold. This information is not intended to replace advice given to you by your health care provider. Make sure you discuss any questions you have with your health care provider. Document Revised: 10/27/2021 Document Reviewed: 10/08/2021 Elsevier Patient Education  2023 Elsevier Inc.  

## 2022-05-21 LAB — LIPID PANEL
Cholesterol: 198 mg/dL (ref <200)
HDL: 83 mg/dL (ref >=50)
LDL Cholesterol (Calc): 96 mg/dL (calc)
Non-HDL Cholesterol (Calc): 115 mg/dL (calc) (ref <130)
Total CHOL/HDL Ratio: 2.4 (calc) (ref <5.0)
Triglycerides: 93 mg/dL (ref <150)

## 2022-05-21 LAB — VITAMIN B12: Vitamin B-12: 755 pg/mL (ref 200–1100)

## 2022-05-21 LAB — VITAMIN D 25 HYDROXY (VIT D DEFICIENCY, FRACTURES): Vit D, 25-Hydroxy: 59 ng/mL (ref 30–100)

## 2022-05-22 ENCOUNTER — Encounter: Payer: Self-pay | Admitting: Nurse Practitioner

## 2022-05-26 ENCOUNTER — Ambulatory Visit (HOSPITAL_COMMUNITY): Payer: Medicare Other | Attending: Cardiology

## 2022-05-26 DIAGNOSIS — Z952 Presence of prosthetic heart valve: Secondary | ICD-10-CM | POA: Insufficient documentation

## 2022-05-26 LAB — ECHOCARDIOGRAM COMPLETE
AR max vel: 1.19 cm2
AV Area VTI: 1.24 cm2
AV Area mean vel: 1.21 cm2
AV Mean grad: 14 mmHg
AV Peak grad: 24.4 mmHg
Ao pk vel: 2.47 m/s
Area-P 1/2: 1.79 cm2
S' Lateral: 3.2 cm

## 2022-05-31 ENCOUNTER — Encounter: Payer: Self-pay | Admitting: *Deleted

## 2022-06-18 ENCOUNTER — Encounter: Payer: Self-pay | Admitting: Gastroenterology

## 2022-06-21 ENCOUNTER — Encounter: Payer: Self-pay | Admitting: Nurse Practitioner

## 2022-06-22 ENCOUNTER — Other Ambulatory Visit: Payer: Self-pay

## 2022-06-22 DIAGNOSIS — R197 Diarrhea, unspecified: Secondary | ICD-10-CM

## 2022-06-22 DIAGNOSIS — K52832 Lymphocytic colitis: Secondary | ICD-10-CM

## 2022-06-23 ENCOUNTER — Other Ambulatory Visit: Payer: Self-pay | Admitting: Gastroenterology

## 2022-06-23 ENCOUNTER — Other Ambulatory Visit: Payer: Self-pay

## 2022-06-23 DIAGNOSIS — K52832 Lymphocytic colitis: Secondary | ICD-10-CM

## 2022-06-23 DIAGNOSIS — R197 Diarrhea, unspecified: Secondary | ICD-10-CM

## 2022-06-23 MED ORDER — BUDESONIDE 3 MG PO CPEP
9.0000 mg | ORAL_CAPSULE | Freq: Every day | ORAL | 0 refills | Status: AC
Start: 2022-06-23 — End: 2022-07-23

## 2022-06-27 ENCOUNTER — Emergency Department (HOSPITAL_COMMUNITY): Payer: Medicare Other

## 2022-06-27 ENCOUNTER — Encounter (HOSPITAL_COMMUNITY): Payer: Self-pay | Admitting: *Deleted

## 2022-06-27 ENCOUNTER — Emergency Department (HOSPITAL_COMMUNITY)
Admission: EM | Admit: 2022-06-27 | Discharge: 2022-06-27 | Disposition: A | Discharge status: Home / Self Care | Payer: Medicare Other | Attending: Emergency Medicine | Admitting: Emergency Medicine

## 2022-06-27 ENCOUNTER — Other Ambulatory Visit: Payer: Self-pay

## 2022-06-27 DIAGNOSIS — S81812A Laceration without foreign body, left lower leg, initial encounter: Secondary | ICD-10-CM | POA: Insufficient documentation

## 2022-06-27 DIAGNOSIS — Z23 Encounter for immunization: Secondary | ICD-10-CM | POA: Insufficient documentation

## 2022-06-27 DIAGNOSIS — W228XXA Striking against or struck by other objects, initial encounter: Secondary | ICD-10-CM | POA: Insufficient documentation

## 2022-06-27 DIAGNOSIS — Y9389 Activity, other specified: Secondary | ICD-10-CM | POA: Insufficient documentation

## 2022-06-27 DIAGNOSIS — S81811A Laceration without foreign body, right lower leg, initial encounter: Secondary | ICD-10-CM | POA: Diagnosis not present

## 2022-06-27 DIAGNOSIS — S8991XA Unspecified injury of right lower leg, initial encounter: Secondary | ICD-10-CM | POA: Diagnosis present

## 2022-06-27 MED ORDER — "XEROFORM PETROLAT PATCH 2""X2"" EX PADS": 1.0000 | MEDICATED_PAD | Freq: Every day | CUTANEOUS | 0 refills | Status: AC

## 2022-06-27 MED ORDER — ACETAMINOPHEN 325 MG PO TABS
650.0000 mg | ORAL_TABLET | Freq: Once | ORAL | Status: AC
  Administered 2022-06-27: 650 mg via ORAL
  Filled 2022-06-27: qty 2

## 2022-06-27 MED ORDER — TETANUS-DIPHTH-ACELL PERTUSSIS 5-2.5-18.5 LF-MCG/0.5 IM SUSY
0.5000 mL | PREFILLED_SYRINGE | Freq: Once | INTRAMUSCULAR | Status: AC
  Administered 2022-06-27: 0.5 mL via INTRAMUSCULAR
  Filled 2022-06-27: qty 0.5

## 2022-06-27 NOTE — ED Triage Notes (Signed)
Pt hit lower anterior leg and has a 2 inch skin tear. Bleeding controlled.

## 2022-06-27 NOTE — Discharge Instructions (Addendum)
Keep the area clean with warm soap and water.  Dab dry and covered with a Xeroform dressing that I have prescribed you.  Tylenol 1000 mg every 6 hours as needed for pain.  You are updated on your tetanus today.

## 2022-06-27 NOTE — ED Provider Triage Note (Signed)
Emergency Medicine Provider Triage Evaluation Note  Connie Alvarado , a 81 y.o. female  was evaluated in triage.  Pt complains of laceration to the anterior of her right shin. The patient reports that she was moving and a jewelry draw opened and cut her leg. Tetanus status is unknown.  Review of Systems  Positive:  Negative:   Physical Exam  BP 120/90 (BP Location: Left Arm)   Pulse 96   Temp 97.8 F (36.6 C) (Oral)   Resp 16   Ht '5\' 4"'$  (1.626 m)   Wt 52.2 kg   SpO2 96%   BMI 19.74 kg/m  Gen:   Awake, no distress   Resp:  Normal effort  MSK:   Moves extremities without difficulty  Other:    Medical Decision Making  Medically screening exam initiated at 10:08 AM.  Appropriate orders placed.  Garnetta Buddy was informed that the remainder of the evaluation will be completed by another provider, this initial triage assessment does not replace that evaluation, and the importance of remaining in the ED until their evaluation is complete.  Xray ordered   Sherrell Puller, PA-C 06/27/22 [id number]

## 2022-06-27 NOTE — ED Provider Notes (Signed)
[facility] DEPT Provider Note   CSN: [id number] Arrival date & time: 06/27/22  0957     History  Chief Complaint  Patient presents with   Laceration    Connie Alvarado is a 81 y.o. female.   Laceration  Patient is an 81 year old female with past medical history significant for anxiety, HLD, EtOH, depression  She is present emergency room today with complaints of right shin pain and skin tear and bleeding after she smacked her leg into a piece of furniture earlier today.  She did not fall or suffer any other injuries as a result of this.      Home Medications Prior to Admission medications   Medication Sig Start Date End Date Taking? Authorizing Provider  predniSONE (DELTASONE) 20 MG tablet 2 tablets daily for 3 days, 1 tablet daily for 4 days. 04/20/22   [name], NP  atorvastatin (LIPITOR) 80 MG tablet Take  1 tablet  Daily for Cholesterol                              /                     TAKE 1 TABLET BY MOUTH 11/01/21   Unk Pinto, MD  bismuth subsalicylate (PEPTO BISMOL) 262 MG/15ML suspension Take 30 mLs by mouth every 6 (six) hours as needed.    [provider]  Bismuth Tribromoph-Petrolatum (XEROFORM PETROLAT PATCH 2"X2") PADS Apply 1 Pad topically daily for 14 days. 06/27/22 07/11/22 Yes Vannesa Abair S, PA  budesonide (ENTOCORT EC) 3 MG 24 hr capsule Take 3 capsules (9 mg total) by mouth daily. 06/23/22 07/23/22  Thornton Park, MD  Cholecalciferol (VITAMIN D) 125 MCG (5000 UT) CAPS Take 1 tablet by mouth daily.    [provider]  gabapentin (NEURONTIN) 100 MG capsule Take 1-3 capsules (100-300 mg total) by mouth 3 (three) times daily as needed. For back pain. Patient not taking: Reported on 05/20/2022 04/20/22 04/20/23  [name], NP  Multiple Vitamin (MULTIVITAMIN ADULT PO) Take 3,000 Units by mouth. [employer]    [provider]  Pancrelipase, Lip-Prot-Amyl, (CREON) 24000-76000 units CPEP  One with meals and one with snacks 12/03/21   Thornton Park, MD  traMADol (ULTRAM) 50 MG tablet Take 1 tablet (50 mg total) by mouth every 6 (six) hours as needed. 04/29/22 04/29/23  Fransico Meadow, PA-C      Allergies    Tizanidine    Review of Systems   Review of Systems  Physical Exam Updated Vital Signs BP 120/90 (BP Location: Left Arm)   Pulse 96   Temp 97.8 F (36.6 C) (Oral)   Resp 16   Ht '5\' 4"'$  (1.626 m)   Wt 52.2 kg   SpO2 96%   BMI 19.74 kg/m  Physical Exam Vitals and nursing note reviewed.  Constitutional:      General: She is not in acute distress.    Appearance: Normal appearance. She is not ill-appearing.  HENT:     Head: Normocephalic and atraumatic.     Mouth/Throat:     Mouth: Mucous membranes are moist.  Eyes:     General: No scleral icterus.       Right eye: No discharge.        Left eye: No discharge.     Conjunctiva/sclera: Conjunctivae normal.  Cardiovascular:     Comments: DP pulses intact bilateral lower  extremities Pulmonary:     Effort: Pulmonary effort is normal.     Breath sounds: No stridor.  Skin:    Comments: Small horizontally oriented and skin tear on right shin approximately 3 cm mid shin  Neurological:     Mental Status: She is alert and oriented to person, place, and time. Mental status is at baseline.     Comments: Sensation intact in lower extremities     ED Results / Procedures / Treatments   Labs (all labs ordered are listed, but only abnormal results are displayed) Labs Reviewed - No data to display  EKG None  Radiology DG Tibia/Fibula Right  Result Date: 06/27/2022 CLINICAL DATA:  Right leg laceration. EXAM: RIGHT TIBIA AND FIBULA - 2 VIEW COMPARISON:  Ankle radiographs 04/29/2022. FINDINGS: The mineralization and alignment are normal. There is no evidence of acute fracture or dislocation. Tricompartmental degenerative changes at the right knee. Possible pretibial soft tissue swelling in the distal lower leg, without  evidence of foreign body or soft tissue emphysema. IMPRESSION: No evidence of acute fracture, dislocation or foreign body. Electronically Signed   By: Richardean Sale M.D.   On: 06/27/2022 11:06    Procedures Procedures    Medications Ordered in ED Medications  acetaminophen (TYLENOL) tablet 650 mg (650 mg Oral Given 06/27/22 1108)  Tdap (BOOSTRIX) injection 0.5 mL (0.5 mLs Intramuscular Given 06/27/22 1108)    ED Course/ Medical Decision Making/ A&P                           Medical Decision Making Risk OTC drugs. Prescription drug management.   Patient with traumatic skin tear of right shin.  This looks extensively rinsed in the ER by me  The depth of wound was explored and this represents a superficial skin tear.  Xeroform applied by RN and dressing applied.  Wound care instructions given with warm soapy water wash at home and Xeroform or Telfa to use at home.  Return precautions discussed.  Patient was updated on tetanus that she was uncertain of her last vaccine  Final Clinical Impression(s) / ED Diagnoses Final diagnoses:  Skin tear of left lower leg without complication, initial encounter    Rx / DC Orders ED Discharge Orders          Ordered    Bismuth Tribromoph-Petrolatum [facility] 2"X2") PADS  Daily        06/27/22 1105              Pati Gallo [place], [place] 06/27/22 1805    Daleen Bo, MD 06/28/22 [phone]

## 2022-06-28 ENCOUNTER — Telehealth: Payer: Self-pay

## 2022-06-28 NOTE — Telephone Encounter (Signed)
LM-06/28/22-Called pt. To complete ED discharge protocol. Unable to reach. [place] X1.  Total time spent: 2 min.

## 2022-06-30 NOTE — Telephone Encounter (Signed)
LM-06/30/22-Called pt. Daughter [name] to complete ED discharge protocol. Unable to reach. [place] X2.  Total time spent: 2 min.

## 2022-07-01 ENCOUNTER — Other Ambulatory Visit: Payer: Self-pay | Admitting: Nurse Practitioner

## 2022-07-01 ENCOUNTER — Telehealth: Payer: Self-pay | Admitting: Nurse Practitioner

## 2022-07-01 MED ORDER — SULFAMETHOXAZOLE-TRIMETHOPRIM 400-80 MG PO TABS: 1.0000 | ORAL_TABLET | Freq: Two times a day (BID) | ORAL | 0 refills | Status: AC

## 2022-07-01 NOTE — Telephone Encounter (Signed)
Daughter called saying pt was in ER for a cut on her leg, it has now gotten infected. Spoke with Kenney Houseman and she is comfortable sending in abx for the patient due to the recent notes in epic relating ER visit.

## 2022-07-01 NOTE — Telephone Encounter (Signed)
LM-07/01/22-Calling pt. Daughter Kaila Devries to confirm CP visit and complete ED discharge protocol. Unable to reach pt.daughter. VM box full. 3rd attempt reaching pt. Daughter.   Total time spent: 2 min.

## 2022-07-02 DIAGNOSIS — S81811D Laceration without foreign body, right lower leg, subsequent encounter: Secondary | ICD-10-CM | POA: Diagnosis not present

## 2022-07-02 DIAGNOSIS — F1090 Alcohol use, unspecified, uncomplicated: Secondary | ICD-10-CM | POA: Diagnosis not present

## 2022-07-02 DIAGNOSIS — E785 Hyperlipidemia, unspecified: Secondary | ICD-10-CM | POA: Diagnosis not present

## 2022-07-02 DIAGNOSIS — F419 Anxiety disorder, unspecified: Secondary | ICD-10-CM | POA: Diagnosis not present

## 2022-07-02 DIAGNOSIS — F32A Depression, unspecified: Secondary | ICD-10-CM | POA: Diagnosis not present

## 2022-07-05 ENCOUNTER — Telehealth: Payer: Self-pay

## 2022-07-05 NOTE — Telephone Encounter (Signed)
LM-07/05/22-Called pt. Daughter To completed ED discharge protocol and  confirm CP visit for 7/11 phone visit. Unable to reach. [place] X3. 4th attempt to reach pt/daughter.  Total time spent: 2 min

## 2022-07-06 ENCOUNTER — Ambulatory Visit: Payer: Medicare Other | Admitting: Pharmacy Technician

## 2022-07-06 DIAGNOSIS — Z79899 Other long term (current) drug therapy: Secondary | ICD-10-CM

## 2022-07-06 DIAGNOSIS — E782 Mixed hyperlipidemia: Secondary | ICD-10-CM

## 2022-07-06 DIAGNOSIS — E785 Hyperlipidemia, unspecified: Secondary | ICD-10-CM | POA: Diagnosis not present

## 2022-07-06 DIAGNOSIS — F32A Depression, unspecified: Secondary | ICD-10-CM | POA: Diagnosis not present

## 2022-07-06 DIAGNOSIS — F1090 Alcohol use, unspecified, uncomplicated: Secondary | ICD-10-CM | POA: Diagnosis not present

## 2022-07-06 DIAGNOSIS — Z9181 History of falling: Secondary | ICD-10-CM

## 2022-07-06 DIAGNOSIS — F419 Anxiety disorder, unspecified: Secondary | ICD-10-CM | POA: Diagnosis not present

## 2022-07-06 DIAGNOSIS — F1019 Alcohol abuse with unspecified alcohol-induced disorder: Secondary | ICD-10-CM

## 2022-07-06 DIAGNOSIS — S81811D Laceration without foreign body, right lower leg, subsequent encounter: Secondary | ICD-10-CM | POA: Diagnosis not present

## 2022-07-06 DIAGNOSIS — M8580 Other specified disorders of bone density and structure, unspecified site: Secondary | ICD-10-CM

## 2022-07-07 ENCOUNTER — Telehealth: Payer: Self-pay

## 2022-07-07 ENCOUNTER — Encounter: Payer: Self-pay | Admitting: Nurse Practitioner

## 2022-07-07 NOTE — Telephone Encounter (Signed)
LM-07/07/22-Pharmacist visit notes from 7/11 CP visit uploaded to pt. Documents. FWD to CP for review.  Total time spent: 4 min.

## 2022-07-07 NOTE — Progress Notes (Signed)
Pharmacist Visit  Alvarado,Connie  80 years, Female  DOB: 1941-10-11  M: [phone]  __________________________________________________ Chronic Conditions Patient's Chronic Conditions: Dementia, Osteopenia or Osteoporosis, Hyperlipidemia/Dyslipidemia (HLD), Other, Hypertension (HTN) List Other Conditions (separated by comma): Senile purpura, Lung nodules, Lymphocytic colitis, DDD, Alcohol abuse w/ alcohol-induced disorder, B12 deficiency, Vitamin D deficiency, Thiamine deficiency, Lumbar back pain  [place] for PCP:  1. Patient now living at [facility]. 2. LDL above goal of 70. On Lipitor '80mg'$  daily. PCP consider Zetia 10 if next lipid panel still elevated. 3. Patient now has more access to alcohol at independent living. Counseled on risk for falls, sedation, worsening memory, and drug interactions. 4. Asked daughter to make sure patient gets a calcium supplement at Ca is low.  Disease Assessments Current BP: 120/90 Current HR: 96 taken on: 06/27/2022 Weight: 115 BMI: 19.74 Last GFR: >60 taken on: 04/29/2022 Why did the patient present?: CCM Follow up Lifestyle habits such as diet and exercise?: None Alcohol, tobacco, and illicit drug usage?: Alcohol problem. Admits to 1-1.5 glasses of wine daily. Has been more. Factors that may affect medication adherence?: Altered mental status (dementia, substance use, mental illness) Is Patient using [facility] pharmacy?: No Name and location of Current pharmacy: [facility] Current Rx insurance plan: Blue Medicare Are meds synced by current pharmacy?: No Are meds delivered by current pharmacy?: No - delivery not available Would patient benefit from direct intervention of clinical lead in dispensing process to optimize clinical outcomes?: Yes Are [facility] pharmacy services available where patient lives?: Yes Is patient disadvantaged to use [facility]?: No [facility] services reviewed with patient and patient  wishes to change pharmacy?: No Select reason patient declined to change pharmacies: Patient preference Does patient experience delays in picking up medications due to transportation concerns (getting to pharmacy)?: No Any additional demeanor/mood notes?: CCM follow up via phone today with daughter [name] and patient. Patient is not a good historian at all and asks Lauren to answer most questions. Forgets things very quickly. She now lives at [facility]. Daughter has to remind patient to take medications daily, otherwise forgets. Has a pill box for organizations.  Hyperlipidemia/Dyslipidemia (HLD) Last Lipid panel on: 05/20/2022 TC (Goal<200): 314 LDL: 196 HDL (Goal>40): 74 TG (Goal<150): 248 ASCVD 10-year risk?is:: N/A due to Age > 79 Assess this condition today?: Yes LDL Goal: <70 Has patient tried and failed any HLD Medications?: No Check present secondary causes (below) that can lead to increased cholesterol levels (multi-choice optional): Alcohol use We discussed: How a diet high in plant sterols (fruits/vegetables/nuts/whole grains/legumes) may reduce your cholesterol., How to reduce cholesterol through diet/weight management and physical activity., Encouraged increasing fiber to a daily intake of 10-25g/day Assessment:: Uncontrolled Drug: Lipitor '80mg'$  daily Assessment: Appropriate, Query Effectiveness Additional Info: Unsure if patient was taking at last labs. Plan to Start: PCP Consider addition of Zetia if LDL still elevated Plan to Order: Lipid panel Plan to Review: Lipid panel Plan to Counsel: Diet/Lifestyle modifications, decreasing alcohol consumption, medication adherence HC Follow up: General Assessment every 3 months Pharmacist Follow up: As needed  Osteopenia or Osteoporosis Current T-score: -1.9 taken on: 11/26/2021 Current Vitamin D 25-OH: 59 taken on: 05/20/2022 Assess this condition today?: Yes Patient has: Osteopenia In the past 12  months, have you fallen?: No Are there any stairs in or around the home?: No Is the home free of loose throw rugs in walkways, pet beds, electrical cords, etc.?: Yes Is there adequate lighting in your home to reduce  the risk of falls?: Yes Any fractures since last visit (include location and date): None Dietary calcium intake: Milk, Yougurt, Orange juice Risk Factors: Alcohol Use We discussed: Weight bearing exercises (walking, light weights, resistance training), Dietary calcium intake, Fall prevention Assessment:: Uncontrolled Drug: Vitamin D 5000u daily Assessment: Appropriate, Query Effectiveness Additional Info: Patient not currently taking Calcium Supplement Plan to Start: Calcium '600mg'$  BID Plan to Review: Vit D, DEXA Plan to Counsel: Counseled on alcohol consumption, weight bearing exercise, Calcium/Vit D supplementation.  HC Follow up: General Assessments every 3 months Pharmacist Follow up: As needed  High Risk Alcohol Use Assess this condition today?: Yes What condition are we assessing today?: High Risk Alcohol Use Other Information: Patient was weaned to 1-1.5 glasses of wine daily at daughter's house. She now lives at [facility] and alcohol is available more to patient now. Daughter will continue to monitor. Assessment:: Uncontrolled Drug: None Assessment: Query need Additional Info: Memory has been getting worse per daughter. Plan to Counsel: Counseled on risk for falls, fractures, and sedation HC Follow up: General assessment every 3 months Pharmacist Follow up: As needed  Exercise, Diet and Non-Drug Coordination Needs Additional exercise counseling points. We discussed: aiming to lose 5 to 10% of body weight through lifestyle modifications, decreasing sedentary behavior, incorporating flexibility, balance, and strength training exercises Additional diet counseling points. We discussed: key components of the DASH diet, key components of a low-carb  eating plan, aiming to consume at least 8 cups of water day, limiting alcohol intake Discussed Non-Drug Care Coordination Needs: Yes Does Patient have Medication financial barriers?: No  Accountable [facility] Communities Health-Related Social Needs Screening Tool -  SDOH  ([email or web address]) What is your living situation today? (ref #1): I have a steady place to live Think about the place you live. Do you have problems with any of the following? (ref #2): None of the above Within the past 12 months, you worried that your food would run out before you got money to buy more (ref #3): Never true Within the past 12 months, the food you bought just didn't last and you didn't have money to get more (ref #4): Never true In the past 12 months, has lack of reliable transportation kept you from medical appointments, meetings, work or from getting things needed for daily living? (ref #5): No In the past 12 months, has the electric, gas, oil, or water company threatened to shut off services in your home? (ref #6): No How often does anyone, including family and friends, physically hurt you? (ref #7): Never (1) How often does anyone, including family and friends, insult or talk down to you? (ref #8): Never (1) How often does anyone, including friends and family, threaten you with harm? (ref #9): Never (1) How often does anyone, including family and friends, scream or curse at you? (ref #10): Never (1)  Engagement Notes CPP Prep: 37mn CPP OV: 332m CPP Doc: 2472m Clinical Summary Patient Risk: High Next CCM Follow Up: Daughter to call with concerns Next AWV: 05/25/23 Next PCP Visit: 08/26/22  AveMarda StalkerharmD Clinical Pharmacist AveNaida Sleightlton'[email or web address] .care ([phone]

## 2022-07-09 DIAGNOSIS — F1090 Alcohol use, unspecified, uncomplicated: Secondary | ICD-10-CM | POA: Diagnosis not present

## 2022-07-09 DIAGNOSIS — S81811D Laceration without foreign body, right lower leg, subsequent encounter: Secondary | ICD-10-CM | POA: Diagnosis not present

## 2022-07-09 DIAGNOSIS — E785 Hyperlipidemia, unspecified: Secondary | ICD-10-CM | POA: Diagnosis not present

## 2022-07-09 DIAGNOSIS — F419 Anxiety disorder, unspecified: Secondary | ICD-10-CM | POA: Diagnosis not present

## 2022-07-09 DIAGNOSIS — F32A Depression, unspecified: Secondary | ICD-10-CM | POA: Diagnosis not present

## 2022-07-12 ENCOUNTER — Encounter: Payer: Self-pay | Admitting: Nurse Practitioner

## 2022-07-12 ENCOUNTER — Ambulatory Visit (INDEPENDENT_AMBULATORY_CARE_PROVIDER_SITE_OTHER): Payer: Medicare Other | Admitting: Nurse Practitioner

## 2022-07-12 VITALS — BP 128/62 | HR 96 | Temp 97.7°F | Wt 115.8 lb

## 2022-07-12 DIAGNOSIS — R5383 Other fatigue: Secondary | ICD-10-CM | POA: Diagnosis not present

## 2022-07-12 DIAGNOSIS — R413 Other amnesia: Secondary | ICD-10-CM | POA: Diagnosis not present

## 2022-07-12 DIAGNOSIS — F32A Depression, unspecified: Secondary | ICD-10-CM | POA: Diagnosis not present

## 2022-07-12 DIAGNOSIS — S81811D Laceration without foreign body, right lower leg, subsequent encounter: Secondary | ICD-10-CM

## 2022-07-12 DIAGNOSIS — F331 Major depressive disorder, recurrent, moderate: Secondary | ICD-10-CM

## 2022-07-12 DIAGNOSIS — Z9181 History of falling: Secondary | ICD-10-CM | POA: Diagnosis not present

## 2022-07-12 DIAGNOSIS — F1090 Alcohol use, unspecified, uncomplicated: Secondary | ICD-10-CM | POA: Diagnosis not present

## 2022-07-12 DIAGNOSIS — F419 Anxiety disorder, unspecified: Secondary | ICD-10-CM | POA: Diagnosis not present

## 2022-07-12 DIAGNOSIS — E785 Hyperlipidemia, unspecified: Secondary | ICD-10-CM | POA: Diagnosis not present

## 2022-07-12 MED ORDER — BUPROPION HCL ER (XL) 150 MG PO TB24: 150.0000 mg | ORAL_TABLET | ORAL | 2 refills | Status: DC

## 2022-07-12 NOTE — Telephone Encounter (Signed)
LM-07/12/22-Called pt. daughter [name] to assess pts. left leg wound. Unable to reach pt. Daughter. [place] X1.   Total time spent: 2 min.

## 2022-07-12 NOTE — Patient Instructions (Signed)
Bupropion Extended-Release Tablets (Depression/Mood Disorders) What is this medication? BUPROPION (byoo PROE pee on) treats depression. It increases norepinephrine and dopamine in the brain, hormones that help regulate mood. It belongs to a group of medications called NDRIs. This medicine may be used for other purposes; ask your health care provider or pharmacist if you have questions. COMMON BRAND NAME(S): Aplenzin, Budeprion XL, Forfivo XL, Wellbutrin XL What should I tell my care team before I take this medication? They need to know if you have any of these conditions: An eating disorder, such as anorexia or bulimia Bipolar disorder or psychosis Diabetes or high blood sugar, treated with medication Glaucoma Head injury or brain tumor Heart disease, previous heart attack, or irregular heart beat High blood pressure Kidney or liver disease Seizures (convulsions) Suicidal thoughts or a previous suicide attempt Tourette's syndrome Weight loss An unusual or allergic reaction to bupropion, other medications, foods, dyes, or preservatives Pregnant or trying to become pregnant Breast-feeding How should I use this medication? Take this medication by mouth with a glass of water. Follow the directions on the prescription label. You can take it with or without food. If it upsets your stomach, take it with food. Do not crush, chew, or cut these tablets. This medication is taken once daily at the same time each day. Do not take your medication more often than directed. Do not stop taking this medication suddenly except upon the advice of your care team. Stopping this medication too quickly may cause serious side effects or your condition may worsen. A special MedGuide will be given to you by the pharmacist with each prescription and refill. Be sure to read this information carefully each time. Talk to your care team about the use of this medication in children. Special care may be  needed. Overdosage: If you think you have taken too much of this medicine contact a poison control center or emergency room at once. NOTE: This medicine is only for you. Do not share this medicine with others. What if I miss a dose? If you miss a dose, skip the missed dose and take your next tablet at the regular time. Do not take double or extra doses. What may interact with this medication? Do not take this medication with any of the following: Linezolid MAOIs like Azilect, Carbex, Eldepryl, Marplan, Nardil, and Parnate Methylene blue (injected into a vein) Other medications that contain bupropion like Zyban This medication may also interact with the following: Alcohol Certain medications for anxiety or sleep Certain medications for blood pressure like metoprolol, propranolol Certain medications for depression or psychotic disturbances Certain medications for HIV or AIDS like efavirenz, lopinavir, nelfinavir, ritonavir Certain medications for irregular heart beat like propafenone, flecainide Certain medications for Parkinson's disease like amantadine, levodopa Certain medications for seizures like carbamazepine, phenytoin, phenobarbital Cimetidine Clopidogrel Cyclophosphamide Digoxin Furazolidone Isoniazid Nicotine Orphenadrine Procarbazine Steroid medications like prednisone or cortisone Stimulant medications for attention disorders, weight loss, or to stay awake Tamoxifen Theophylline Thiotepa Ticlopidine Tramadol Warfarin This list may not describe all possible interactions. Give your health care provider a list of all the medicines, herbs, non-prescription drugs, or dietary supplements you use. Also tell them if you smoke, drink alcohol, or use illegal drugs. Some items may interact with your medicine. What should I watch for while using this medication? Tell your care team if your symptoms do not get better or if they get worse. Visit your care team for regular checks on  your progress. Because it may take several weeks  to see the full effects of this medication, it is important to continue your treatment as prescribed. Watch for new or worsening thoughts of suicide or depression. This includes sudden changes in mood, behavior, or thoughts. These changes can happen at any time but are more common in the beginning of treatment or after a change in dose. Call your care team right away if you experience these thoughts or worsening depression. Manic episodes may happen in patients with bipolar disorder who take this medication. Watch for changes in feelings or behaviors such as feeling anxious, nervous, agitated, panicky, irritable, hostile, aggressive, impulsive, severely restless, overly excited and hyperactive, or trouble sleeping. These symptoms can happen at anytime but are more common in the beginning of treatment or after a change in dose. Call your care team right away if you notice any of these symptoms. This medication may cause serious skin reactions. They can happen weeks to months after starting the medication. Contact your care team right away if you notice fevers or flu-like symptoms with a rash. The rash may be red or purple and then turn into blisters or peeling of the skin. Or, you might notice a red rash with swelling of the face, lips or lymph nodes in your neck or under your arms. Avoid drinks that contain alcohol while taking this medication. Drinking large amounts of alcohol, using sleeping or anxiety medications, or quickly stopping the use of these agents while taking this medication may increase your risk for a seizure. Do not drive or use heavy machinery until you know how this medication affects you. This medication can impair your ability to perform these tasks. Do not take this medication close to bedtime. It may prevent you from sleeping. Your mouth may get dry. Chewing sugarless gum or sucking hard candy, and drinking plenty of water may help.  Contact your care team if the problem does not go away or is severe. The tablet shell for some brands of this medication does not dissolve. This is normal. The tablet shell may appear whole in the stool. This is not a cause for concern. What side effects may I notice from receiving this medication? Side effects that you should report to your care team as soon as possible: Allergic reactions--skin rash, itching, hives, swelling of the face, lips, tongue, or throat Increase in blood pressure Mood and behavior changes--anxiety, nervousness, confusion, hallucinations, irritability, hostility, thoughts of suicide or self-harm, worsening mood, feelings of depression Redness, blistering, peeling, or loosening of the skin, including inside the mouth Seizures Sudden eye pain or change in vision such as blurry vision, seeing halos around lights, vision loss Side effects that usually do not require medical attention (report to your care team if they continue or are bothersome): Constipation Dizziness Dry mouth Loss of appetite Nausea Tremors or shaking Trouble sleeping This list may not describe all possible side effects. Call your doctor for medical advice about side effects. You may report side effects to FDA at 1-800-FDA-1088. Where should I keep my medication? Keep out of the reach of children and pets. Store at room temperature between 15 and 30 degrees C (59 and 86 degrees F). Throw away any unused medication after the expiration date. NOTE: This sheet is a summary. It may not cover all possible information. If you have questions about this medicine, talk to your doctor, pharmacist, or health care provider.  2023 Elsevier/Gold Standard (2021-02-25 00:00:00)    Some therapists to try that have been recommended by patients:  Dr. Arbutus Leas, PHD, [facility] [phone]  [place] [phone]  Jackelyn Hoehn - [phone] Focuses on lifestyle  coaching, but also a trained therapist  Spero Curb - [phone] [address], [place], [place] [address]   [facility] - will see uninsured cash pay patients on sliding scale plan rpcounseling.[place], [place],  [address]  ~1.9 mi [phone]

## 2022-07-12 NOTE — Progress Notes (Signed)
Assessment and Plan:   Connie Alvarado was seen today for fatigue.  Diagnoses and all orders for this visit:  Moderate episode of recurrent major depressive disorder ([place]) Will begin Wellbutrin Given list of therapists she can use Monitor symptoms, if no improvement in mood notify the office Instructed patient to contact office or on-call physician promptly should condition worsen or any new symptoms appear. IF THE PATIENT HAS ANY SUICIDAL OR HOMICIDAL IDEATIONS, CALL THE OFFICE, DISCUSS WITH A SUPPORT MEMBER, OR GO TO THE ER IMMEDIATELY. Patient was agreeable with this plan.  -     buPROPion (WELLBUTRIN XL) 150 MG 24 hr tablet; Take 1 tablet (150 mg total) by mouth every morning.  Memory changes Scored 23/30 on MMSE, previous score was a 29 Instructed on brain exercises daily- crossword, word search, puzzles Encourage more interaction at the facility with other residents Follow up with neuro if no improvement noted after she has adjusted to new living arrangements -     CBC with Differential/Platelet -     COMPLETE METABOLIC PANEL WITH GFR -     TSH  Fatigue due to depression Eat regular meals Given number of therapists. Begin Wellbutrin Encourage participating in more activities at the facility Continue to limit alcohol -     CBC with Differential/Platelet -     COMPLETE METABOLIC PANEL WITH GFR -     TSH  At high risk for falls PT is coming to evaluate at the facility Continue to reduce alcohol consumption Fall prevention discussed    Skin tear of right lower leg without complication - Continue to have home nurse do dressing changes twice a week   Further disposition pending results of labs. Discussed med's effects and SE's.   Over 30 minutes of exam, counseling, chart review, and critical decision making was performed.   Future Appointments  Date Time Provider Teller  08/04/2022 11:10 AM Thornton Park, MD [facility]  08/26/2022  3:00 PM Darrol Jump,  NP [facility] None  09/06/2022  1:30 PM Penumalli, Earlean Polka, MD [facility] None  05/25/2023 11:30 AM Cranford, Kenney Houseman, NP [facility] None    ------------------------------------------------------------------------------------------------------------------   HPI BP 128/62   Pulse 96   Temp 97.7 F (36.5 C)   Wt 115 lb 12.8 oz (52.5 kg)   SpO2 97%   BMI 19.88 kg/m   81 y.o.female presents for evaluation of mood.  She recently moved into a senior community on 06/27/22. She injured her leg during the move but has no recall of how it happened. Home nursing is doing dressing changes twice a week.   More confusion and fatigue is noticed.  She has asked her daughter and the desk staff where she is staying about a counselor she can see.  She is in independent living part of [facility]. She says it is very nice and is liking it.  She is having some difficulty adjusting, misses her daughter. She has an [employer] who is starting to introduce.   She has been noticing more fatigue. She believes it has only been present since she moved to the new place. She is sleeping well at the facility and eating well. She has noticed more memory difficulties. She is going to start PT and memory /speech person. She did score 23/30 on MMSE today. She is having 1-2 glasses of wine with her dinner.   BP is well controlled without medication.  Denies headaches, chest pain, shortness of breath and dizziness. BP Readings from Last 3  Encounters:  07/12/22 128/62  06/27/22 120/90  06/21/22 (!) 143/83    BMI is Body mass index is 19.88 kg/m., she has not been working on diet and exercise. Wt Readings from Last 3 Encounters:  07/12/22 115 lb 12.8 oz (52.5 kg)  06/27/22 115 lb (52.2 kg)  06/21/22 120 lb (54.4 kg)     Past Medical History:  Diagnosis Date   Alcohol abuse    Anxiety    Depression    Hyperlipidemia    Lymphocytic colitis    Malaria    as a child   Memory loss      Allergies   Allergen Reactions   Tizanidine     Hypotension "felt like I had a stroke"     Current Outpatient Medications on File Prior to Visit  Medication Sig   predniSONE (DELTASONE) 20 MG tablet 2 tablets daily for 3 days, 1 tablet daily for 4 days.   atorvastatin (LIPITOR) 80 MG tablet Take  1 tablet  Daily for Cholesterol                              /                     TAKE 1 TABLET BY MOUTH   bismuth subsalicylate (PEPTO BISMOL) 262 MG/15ML suspension Take 30 mLs by mouth every 6 (six) hours as needed.   budesonide (ENTOCORT EC) 3 MG 24 hr capsule Take 3 capsules (9 mg total) by mouth daily.   Cholecalciferol (VITAMIN D) 125 MCG (5000 UT) CAPS Take 1 tablet by mouth daily.   gabapentin (NEURONTIN) 100 MG capsule Take 1-3 capsules (100-300 mg total) by mouth 3 (three) times daily as needed. For back pain. (Patient not taking: Reported on 05/20/2022)   Multiple Vitamin (MULTIVITAMIN ADULT PO) Take 3,000 Units by mouth. Life Extensions   Pancrelipase, Lip-Prot-Amyl, (CREON) 24000-76000 units CPEP One with meals and one with snacks   traMADol (ULTRAM) 50 MG tablet Take 1 tablet (50 mg total) by mouth every 6 (six) hours as needed.   No current facility-administered medications on file prior to visit.    ROS: all negative except above.   Physical Exam:  BP 128/62   Pulse 96   Temp 97.7 F (36.5 C)   Wt 115 lb 12.8 oz (52.5 kg)   SpO2 97%   BMI 19.88 kg/m   General Appearance: elderly frail appearing female in no apparent distress Eyes: PERRLA, EOMs, conjunctiva no swelling or erythema Sinuses: No Frontal/maxillary tenderness ENT/Mouth: Ext aud canals clear, normal light reflex with TMs without erythema, bulging.  No erythema, swelling, or exudate on post pharynx. Tonsils not swollen or erythematous. Hearing good Neck: Supple, thyroid normal. No bruits  Respiratory: Respiratory effort normal, BS equal bilaterally without rales, rhonchi, wheezing or stridor.  Cardio: RRR with 2/6  systolic murmur without rubs or gallops.  Chest: symmetric, with normal excursions and percussion.  Abdomen: Soft, nontender, no guarding, rebound, hernias, masses, or organomegaly.  Lymphatics: Non tender without lymphadenopathy.  Musculoskeletal: Full ROM all peripheral extremities, good grip strength, non-antalgic, unsteady gait.  Skin: Warm, dry; fragile without rashes, skin tear of right shin is covered with dressing which is clean and intact, being dressed by home nursing Neuro: Cranial nerves intact,,no cog wheeling, Normal muscle tone, wide based stance,  Neg pronator drift Psych: Awake and oriented X 2, normal affect, Memory more impaired. MMSE 23/30  Alycia Rossetti, NP 3:38 PM [facility]

## 2022-07-13 DIAGNOSIS — S81811D Laceration without foreign body, right lower leg, subsequent encounter: Secondary | ICD-10-CM | POA: Diagnosis not present

## 2022-07-13 DIAGNOSIS — F1090 Alcohol use, unspecified, uncomplicated: Secondary | ICD-10-CM | POA: Diagnosis not present

## 2022-07-13 DIAGNOSIS — F32A Depression, unspecified: Secondary | ICD-10-CM | POA: Diagnosis not present

## 2022-07-13 DIAGNOSIS — E785 Hyperlipidemia, unspecified: Secondary | ICD-10-CM | POA: Diagnosis not present

## 2022-07-13 DIAGNOSIS — F419 Anxiety disorder, unspecified: Secondary | ICD-10-CM | POA: Diagnosis not present

## 2022-07-13 LAB — CBC WITH DIFFERENTIAL/PLATELET
Absolute Monocytes: 448 cells/uL (ref 200–950)
Basophils Absolute: 28 cells/uL (ref 0–200)
Basophils Relative: 0.5 %
Eosinophils Absolute: 62 cells/uL (ref 15–500)
Eosinophils Relative: 1.1 %
HCT: 36.6 % (ref 35.0–45.0)
Hemoglobin: 12.2 g/dL (ref 11.7–15.5)
Lymphs Abs: 896 cells/uL (ref 850–3900)
MCH: 31.2 pg (ref 27.0–33.0)
MCHC: 33.3 g/dL (ref 32.0–36.0)
MCV: 93.6 fL (ref 80.0–100.0)
MPV: 11.9 fL (ref 7.5–12.5)
Monocytes Relative: 8 %
Neutro Abs: 4166 cells/uL (ref 1500–7800)
Neutrophils Relative %: 74.4 %
Platelets: 146 10*3/uL (ref 140–400)
RBC: 3.91 10*6/uL (ref 3.80–5.10)
RDW: 12 % (ref 11.0–15.0)
Total Lymphocyte: 16 %
WBC: 5.6 10*3/uL (ref 3.8–10.8)

## 2022-07-13 LAB — COMPLETE METABOLIC PANEL WITH GFR
AG Ratio: 1.5 (calc) (ref 1.0–2.5)
ALT: 17 U/L (ref 6–29)
AST: 22 U/L (ref 10–35)
Albumin: 3.7 g/dL (ref 3.6–5.1)
Alkaline phosphatase (APISO): 64 U/L (ref 37–153)
BUN: 19 mg/dL (ref 7–25)
CO2: 27 mmol/L (ref 20–32)
Calcium: 9.2 mg/dL (ref 8.6–10.4)
Chloride: 107 mmol/L (ref 98–110)
Creat: 0.78 mg/dL (ref 0.60–0.95)
Globulin: 2.5 g/dL (calc) (ref 1.9–3.7)
Glucose, Bld: 110 mg/dL — ABNORMAL HIGH (ref 65–99)
Potassium: 4.5 mmol/L (ref 3.5–5.3)
Sodium: 142 mmol/L (ref 135–146)
Total Bilirubin: 0.5 mg/dL (ref 0.2–1.2)
Total Protein: 6.2 g/dL (ref 6.1–8.1)
eGFR: 76 mL/min/{1.73_m2} (ref >=60)

## 2022-07-13 LAB — TSH: TSH: 1.09 mIU/L (ref 0.40–4.50)

## 2022-07-14 NOTE — Telephone Encounter (Signed)
LM-07/14/22-Called pts. Daughter [name] to inquire how patients left leg wound is healing.Unable to reach. [place] X2.  Total time spent: 2 min.

## 2022-07-15 DIAGNOSIS — E785 Hyperlipidemia, unspecified: Secondary | ICD-10-CM | POA: Diagnosis not present

## 2022-07-15 DIAGNOSIS — S81811D Laceration without foreign body, right lower leg, subsequent encounter: Secondary | ICD-10-CM | POA: Diagnosis not present

## 2022-07-15 DIAGNOSIS — F419 Anxiety disorder, unspecified: Secondary | ICD-10-CM | POA: Diagnosis not present

## 2022-07-15 DIAGNOSIS — F32A Depression, unspecified: Secondary | ICD-10-CM | POA: Diagnosis not present

## 2022-07-15 DIAGNOSIS — F1090 Alcohol use, unspecified, uncomplicated: Secondary | ICD-10-CM | POA: Diagnosis not present

## 2022-07-16 DIAGNOSIS — F419 Anxiety disorder, unspecified: Secondary | ICD-10-CM | POA: Diagnosis not present

## 2022-07-16 DIAGNOSIS — F32A Depression, unspecified: Secondary | ICD-10-CM | POA: Diagnosis not present

## 2022-07-16 DIAGNOSIS — E785 Hyperlipidemia, unspecified: Secondary | ICD-10-CM | POA: Diagnosis not present

## 2022-07-16 DIAGNOSIS — S81811D Laceration without foreign body, right lower leg, subsequent encounter: Secondary | ICD-10-CM | POA: Diagnosis not present

## 2022-07-16 DIAGNOSIS — F1090 Alcohol use, unspecified, uncomplicated: Secondary | ICD-10-CM | POA: Diagnosis not present

## 2022-07-16 NOTE — Telephone Encounter (Signed)
LM-07/16/22-Called pt. Daughter [name] to assess how pts. Leg wound is healing. Unable to reach [facility] X3.  Total time spent: 2 min.

## 2022-07-19 ENCOUNTER — Telehealth: Payer: Self-pay

## 2022-07-19 NOTE — Telephone Encounter (Signed)
Addendum 06/28/22-Chart Prep started, Reviewing OV, Consults, Hospital visits, Labs and medication changes.  Chart prep completed.  Total time spent: 41 min.

## 2022-07-20 DIAGNOSIS — S81811D Laceration without foreign body, right lower leg, subsequent encounter: Secondary | ICD-10-CM | POA: Diagnosis not present

## 2022-07-20 DIAGNOSIS — F1090 Alcohol use, unspecified, uncomplicated: Secondary | ICD-10-CM | POA: Diagnosis not present

## 2022-07-20 DIAGNOSIS — E785 Hyperlipidemia, unspecified: Secondary | ICD-10-CM | POA: Diagnosis not present

## 2022-07-20 DIAGNOSIS — F419 Anxiety disorder, unspecified: Secondary | ICD-10-CM | POA: Diagnosis not present

## 2022-07-20 DIAGNOSIS — F32A Depression, unspecified: Secondary | ICD-10-CM | POA: Diagnosis not present

## 2022-07-23 DIAGNOSIS — F32A Depression, unspecified: Secondary | ICD-10-CM | POA: Diagnosis not present

## 2022-07-23 DIAGNOSIS — E785 Hyperlipidemia, unspecified: Secondary | ICD-10-CM | POA: Diagnosis not present

## 2022-07-23 DIAGNOSIS — S81811D Laceration without foreign body, right lower leg, subsequent encounter: Secondary | ICD-10-CM | POA: Diagnosis not present

## 2022-07-23 DIAGNOSIS — F419 Anxiety disorder, unspecified: Secondary | ICD-10-CM | POA: Diagnosis not present

## 2022-07-23 DIAGNOSIS — F1090 Alcohol use, unspecified, uncomplicated: Secondary | ICD-10-CM | POA: Diagnosis not present

## 2022-07-26 DIAGNOSIS — M8580 Other specified disorders of bone density and structure, unspecified site: Secondary | ICD-10-CM | POA: Diagnosis not present

## 2022-07-26 DIAGNOSIS — E782 Mixed hyperlipidemia: Secondary | ICD-10-CM | POA: Diagnosis not present

## 2022-07-26 DIAGNOSIS — Z79899 Other long term (current) drug therapy: Secondary | ICD-10-CM | POA: Diagnosis not present

## 2022-07-26 DIAGNOSIS — F1019 Alcohol abuse with unspecified alcohol-induced disorder: Secondary | ICD-10-CM | POA: Diagnosis not present

## 2022-07-27 DIAGNOSIS — E785 Hyperlipidemia, unspecified: Secondary | ICD-10-CM | POA: Diagnosis not present

## 2022-07-27 DIAGNOSIS — F419 Anxiety disorder, unspecified: Secondary | ICD-10-CM | POA: Diagnosis not present

## 2022-07-27 DIAGNOSIS — F32A Depression, unspecified: Secondary | ICD-10-CM | POA: Diagnosis not present

## 2022-07-27 DIAGNOSIS — F1090 Alcohol use, unspecified, uncomplicated: Secondary | ICD-10-CM | POA: Diagnosis not present

## 2022-07-27 DIAGNOSIS — S81811D Laceration without foreign body, right lower leg, subsequent encounter: Secondary | ICD-10-CM | POA: Diagnosis not present

## 2022-07-29 DIAGNOSIS — S81811D Laceration without foreign body, right lower leg, subsequent encounter: Secondary | ICD-10-CM | POA: Diagnosis not present

## 2022-07-29 DIAGNOSIS — F32A Depression, unspecified: Secondary | ICD-10-CM | POA: Diagnosis not present

## 2022-07-29 DIAGNOSIS — F1090 Alcohol use, unspecified, uncomplicated: Secondary | ICD-10-CM | POA: Diagnosis not present

## 2022-07-29 DIAGNOSIS — F419 Anxiety disorder, unspecified: Secondary | ICD-10-CM | POA: Diagnosis not present

## 2022-07-29 DIAGNOSIS — E785 Hyperlipidemia, unspecified: Secondary | ICD-10-CM | POA: Diagnosis not present

## 2022-07-30 DIAGNOSIS — S81811D Laceration without foreign body, right lower leg, subsequent encounter: Secondary | ICD-10-CM | POA: Diagnosis not present

## 2022-07-30 DIAGNOSIS — E785 Hyperlipidemia, unspecified: Secondary | ICD-10-CM | POA: Diagnosis not present

## 2022-07-30 DIAGNOSIS — F419 Anxiety disorder, unspecified: Secondary | ICD-10-CM | POA: Diagnosis not present

## 2022-07-30 DIAGNOSIS — F1090 Alcohol use, unspecified, uncomplicated: Secondary | ICD-10-CM | POA: Diagnosis not present

## 2022-07-30 DIAGNOSIS — F32A Depression, unspecified: Secondary | ICD-10-CM | POA: Diagnosis not present

## 2022-08-01 DIAGNOSIS — F419 Anxiety disorder, unspecified: Secondary | ICD-10-CM | POA: Diagnosis not present

## 2022-08-01 DIAGNOSIS — F32A Depression, unspecified: Secondary | ICD-10-CM | POA: Diagnosis not present

## 2022-08-01 DIAGNOSIS — E785 Hyperlipidemia, unspecified: Secondary | ICD-10-CM | POA: Diagnosis not present

## 2022-08-01 DIAGNOSIS — S81811D Laceration without foreign body, right lower leg, subsequent encounter: Secondary | ICD-10-CM | POA: Diagnosis not present

## 2022-08-01 DIAGNOSIS — F1090 Alcohol use, unspecified, uncomplicated: Secondary | ICD-10-CM | POA: Diagnosis not present

## 2022-08-02 DIAGNOSIS — F1090 Alcohol use, unspecified, uncomplicated: Secondary | ICD-10-CM | POA: Diagnosis not present

## 2022-08-02 DIAGNOSIS — F32A Depression, unspecified: Secondary | ICD-10-CM | POA: Diagnosis not present

## 2022-08-02 DIAGNOSIS — E785 Hyperlipidemia, unspecified: Secondary | ICD-10-CM | POA: Diagnosis not present

## 2022-08-02 DIAGNOSIS — S81811D Laceration without foreign body, right lower leg, subsequent encounter: Secondary | ICD-10-CM | POA: Diagnosis not present

## 2022-08-02 DIAGNOSIS — F419 Anxiety disorder, unspecified: Secondary | ICD-10-CM | POA: Diagnosis not present

## 2022-08-03 DIAGNOSIS — F1090 Alcohol use, unspecified, uncomplicated: Secondary | ICD-10-CM | POA: Diagnosis not present

## 2022-08-03 DIAGNOSIS — F32A Depression, unspecified: Secondary | ICD-10-CM | POA: Diagnosis not present

## 2022-08-03 DIAGNOSIS — S81811D Laceration without foreign body, right lower leg, subsequent encounter: Secondary | ICD-10-CM | POA: Diagnosis not present

## 2022-08-03 DIAGNOSIS — F419 Anxiety disorder, unspecified: Secondary | ICD-10-CM | POA: Diagnosis not present

## 2022-08-03 DIAGNOSIS — E785 Hyperlipidemia, unspecified: Secondary | ICD-10-CM | POA: Diagnosis not present

## 2022-08-04 ENCOUNTER — Ambulatory Visit: Payer: Medicare Other | Admitting: Gastroenterology

## 2022-08-05 DIAGNOSIS — F32A Depression, unspecified: Secondary | ICD-10-CM | POA: Diagnosis not present

## 2022-08-05 DIAGNOSIS — F419 Anxiety disorder, unspecified: Secondary | ICD-10-CM | POA: Diagnosis not present

## 2022-08-05 DIAGNOSIS — F1090 Alcohol use, unspecified, uncomplicated: Secondary | ICD-10-CM | POA: Diagnosis not present

## 2022-08-05 DIAGNOSIS — E785 Hyperlipidemia, unspecified: Secondary | ICD-10-CM | POA: Diagnosis not present

## 2022-08-05 DIAGNOSIS — S81811D Laceration without foreign body, right lower leg, subsequent encounter: Secondary | ICD-10-CM | POA: Diagnosis not present

## 2022-08-09 DIAGNOSIS — S81811D Laceration without foreign body, right lower leg, subsequent encounter: Secondary | ICD-10-CM | POA: Diagnosis not present

## 2022-08-09 DIAGNOSIS — E785 Hyperlipidemia, unspecified: Secondary | ICD-10-CM | POA: Diagnosis not present

## 2022-08-09 DIAGNOSIS — F419 Anxiety disorder, unspecified: Secondary | ICD-10-CM | POA: Diagnosis not present

## 2022-08-09 DIAGNOSIS — F1090 Alcohol use, unspecified, uncomplicated: Secondary | ICD-10-CM | POA: Diagnosis not present

## 2022-08-09 DIAGNOSIS — F32A Depression, unspecified: Secondary | ICD-10-CM | POA: Diagnosis not present

## 2022-08-10 DIAGNOSIS — F419 Anxiety disorder, unspecified: Secondary | ICD-10-CM | POA: Diagnosis not present

## 2022-08-10 DIAGNOSIS — E785 Hyperlipidemia, unspecified: Secondary | ICD-10-CM | POA: Diagnosis not present

## 2022-08-10 DIAGNOSIS — F32A Depression, unspecified: Secondary | ICD-10-CM | POA: Diagnosis not present

## 2022-08-10 DIAGNOSIS — S81811D Laceration without foreign body, right lower leg, subsequent encounter: Secondary | ICD-10-CM | POA: Diagnosis not present

## 2022-08-10 DIAGNOSIS — F1090 Alcohol use, unspecified, uncomplicated: Secondary | ICD-10-CM | POA: Diagnosis not present

## 2022-08-11 DIAGNOSIS — F419 Anxiety disorder, unspecified: Secondary | ICD-10-CM | POA: Diagnosis not present

## 2022-08-11 DIAGNOSIS — F1090 Alcohol use, unspecified, uncomplicated: Secondary | ICD-10-CM | POA: Diagnosis not present

## 2022-08-11 DIAGNOSIS — F32A Depression, unspecified: Secondary | ICD-10-CM | POA: Diagnosis not present

## 2022-08-11 DIAGNOSIS — S81811D Laceration without foreign body, right lower leg, subsequent encounter: Secondary | ICD-10-CM | POA: Diagnosis not present

## 2022-08-11 DIAGNOSIS — E785 Hyperlipidemia, unspecified: Secondary | ICD-10-CM | POA: Diagnosis not present

## 2022-08-13 DIAGNOSIS — S81811D Laceration without foreign body, right lower leg, subsequent encounter: Secondary | ICD-10-CM | POA: Diagnosis not present

## 2022-08-13 DIAGNOSIS — E785 Hyperlipidemia, unspecified: Secondary | ICD-10-CM | POA: Diagnosis not present

## 2022-08-13 DIAGNOSIS — F419 Anxiety disorder, unspecified: Secondary | ICD-10-CM | POA: Diagnosis not present

## 2022-08-13 DIAGNOSIS — F32A Depression, unspecified: Secondary | ICD-10-CM | POA: Diagnosis not present

## 2022-08-13 DIAGNOSIS — F1090 Alcohol use, unspecified, uncomplicated: Secondary | ICD-10-CM | POA: Diagnosis not present

## 2022-08-16 DIAGNOSIS — E785 Hyperlipidemia, unspecified: Secondary | ICD-10-CM | POA: Diagnosis not present

## 2022-08-16 DIAGNOSIS — F419 Anxiety disorder, unspecified: Secondary | ICD-10-CM | POA: Diagnosis not present

## 2022-08-16 DIAGNOSIS — F32A Depression, unspecified: Secondary | ICD-10-CM | POA: Diagnosis not present

## 2022-08-16 DIAGNOSIS — S81811D Laceration without foreign body, right lower leg, subsequent encounter: Secondary | ICD-10-CM | POA: Diagnosis not present

## 2022-08-16 DIAGNOSIS — F1090 Alcohol use, unspecified, uncomplicated: Secondary | ICD-10-CM | POA: Diagnosis not present

## 2022-08-18 DIAGNOSIS — F1090 Alcohol use, unspecified, uncomplicated: Secondary | ICD-10-CM | POA: Diagnosis not present

## 2022-08-18 DIAGNOSIS — E785 Hyperlipidemia, unspecified: Secondary | ICD-10-CM | POA: Diagnosis not present

## 2022-08-18 DIAGNOSIS — S81811D Laceration without foreign body, right lower leg, subsequent encounter: Secondary | ICD-10-CM | POA: Diagnosis not present

## 2022-08-18 DIAGNOSIS — F32A Depression, unspecified: Secondary | ICD-10-CM | POA: Diagnosis not present

## 2022-08-18 DIAGNOSIS — F419 Anxiety disorder, unspecified: Secondary | ICD-10-CM | POA: Diagnosis not present

## 2022-08-19 DIAGNOSIS — F32A Depression, unspecified: Secondary | ICD-10-CM | POA: Diagnosis not present

## 2022-08-19 DIAGNOSIS — E785 Hyperlipidemia, unspecified: Secondary | ICD-10-CM | POA: Diagnosis not present

## 2022-08-19 DIAGNOSIS — F419 Anxiety disorder, unspecified: Secondary | ICD-10-CM | POA: Diagnosis not present

## 2022-08-19 DIAGNOSIS — S81811D Laceration without foreign body, right lower leg, subsequent encounter: Secondary | ICD-10-CM | POA: Diagnosis not present

## 2022-08-19 DIAGNOSIS — F1090 Alcohol use, unspecified, uncomplicated: Secondary | ICD-10-CM | POA: Diagnosis not present

## 2022-08-20 DIAGNOSIS — F1090 Alcohol use, unspecified, uncomplicated: Secondary | ICD-10-CM | POA: Diagnosis not present

## 2022-08-20 DIAGNOSIS — F32A Depression, unspecified: Secondary | ICD-10-CM | POA: Diagnosis not present

## 2022-08-20 DIAGNOSIS — E785 Hyperlipidemia, unspecified: Secondary | ICD-10-CM | POA: Diagnosis not present

## 2022-08-20 DIAGNOSIS — S81811D Laceration without foreign body, right lower leg, subsequent encounter: Secondary | ICD-10-CM | POA: Diagnosis not present

## 2022-08-20 DIAGNOSIS — F419 Anxiety disorder, unspecified: Secondary | ICD-10-CM | POA: Diagnosis not present

## 2022-08-23 DIAGNOSIS — F1090 Alcohol use, unspecified, uncomplicated: Secondary | ICD-10-CM | POA: Diagnosis not present

## 2022-08-23 DIAGNOSIS — E785 Hyperlipidemia, unspecified: Secondary | ICD-10-CM | POA: Diagnosis not present

## 2022-08-23 DIAGNOSIS — F32A Depression, unspecified: Secondary | ICD-10-CM | POA: Diagnosis not present

## 2022-08-23 DIAGNOSIS — S81811D Laceration without foreign body, right lower leg, subsequent encounter: Secondary | ICD-10-CM | POA: Diagnosis not present

## 2022-08-23 DIAGNOSIS — F419 Anxiety disorder, unspecified: Secondary | ICD-10-CM | POA: Diagnosis not present

## 2022-08-25 ENCOUNTER — Telehealth: Payer: Medicare Other | Admitting: Diagnostic Neuroimaging

## 2022-08-25 DIAGNOSIS — S81811D Laceration without foreign body, right lower leg, subsequent encounter: Secondary | ICD-10-CM | POA: Diagnosis not present

## 2022-08-25 DIAGNOSIS — F419 Anxiety disorder, unspecified: Secondary | ICD-10-CM | POA: Diagnosis not present

## 2022-08-25 DIAGNOSIS — E785 Hyperlipidemia, unspecified: Secondary | ICD-10-CM | POA: Diagnosis not present

## 2022-08-25 DIAGNOSIS — F1090 Alcohol use, unspecified, uncomplicated: Secondary | ICD-10-CM | POA: Diagnosis not present

## 2022-08-25 DIAGNOSIS — F32A Depression, unspecified: Secondary | ICD-10-CM | POA: Diagnosis not present

## 2022-08-26 ENCOUNTER — Encounter: Payer: Medicare Other | Admitting: Nurse Practitioner

## 2022-09-06 ENCOUNTER — Telehealth: Payer: Medicare Other | Admitting: Diagnostic Neuroimaging

## 2022-09-16 ENCOUNTER — Encounter: Payer: Self-pay | Admitting: Nurse Practitioner

## 2022-09-16 ENCOUNTER — Ambulatory Visit (INDEPENDENT_AMBULATORY_CARE_PROVIDER_SITE_OTHER): Payer: Medicare Other | Admitting: Nurse Practitioner

## 2022-09-16 VITALS — BP 102/82 | HR 59 | Temp 97.9°F | Ht 64.0 in | Wt 107.0 lb

## 2022-09-16 DIAGNOSIS — D692 Other nonthrombocytopenic purpura: Secondary | ICD-10-CM

## 2022-09-16 DIAGNOSIS — Z131 Encounter for screening for diabetes mellitus: Secondary | ICD-10-CM

## 2022-09-16 DIAGNOSIS — D649 Anemia, unspecified: Secondary | ICD-10-CM

## 2022-09-16 DIAGNOSIS — E538 Deficiency of other specified B group vitamins: Secondary | ICD-10-CM

## 2022-09-16 DIAGNOSIS — Z1389 Encounter for screening for other disorder: Secondary | ICD-10-CM

## 2022-09-16 DIAGNOSIS — R600 Localized edema: Secondary | ICD-10-CM

## 2022-09-16 DIAGNOSIS — E559 Vitamin D deficiency, unspecified: Secondary | ICD-10-CM

## 2022-09-16 DIAGNOSIS — I700 Atherosclerosis of aorta: Secondary | ICD-10-CM | POA: Diagnosis not present

## 2022-09-16 DIAGNOSIS — R413 Other amnesia: Secondary | ICD-10-CM

## 2022-09-16 DIAGNOSIS — K52832 Lymphocytic colitis: Secondary | ICD-10-CM

## 2022-09-16 DIAGNOSIS — J439 Emphysema, unspecified: Secondary | ICD-10-CM

## 2022-09-16 DIAGNOSIS — Z136 Encounter for screening for cardiovascular disorders: Secondary | ICD-10-CM

## 2022-09-16 DIAGNOSIS — R918 Other nonspecific abnormal finding of lung field: Secondary | ICD-10-CM

## 2022-09-16 DIAGNOSIS — F3342 Major depressive disorder, recurrent, in full remission: Secondary | ICD-10-CM

## 2022-09-16 DIAGNOSIS — Z79899 Other long term (current) drug therapy: Secondary | ICD-10-CM

## 2022-09-16 DIAGNOSIS — Z952 Presence of prosthetic heart valve: Secondary | ICD-10-CM

## 2022-09-16 DIAGNOSIS — F1019 Alcohol abuse with unspecified alcohol-induced disorder: Secondary | ICD-10-CM | POA: Diagnosis not present

## 2022-09-16 DIAGNOSIS — Z0001 Encounter for general adult medical examination with abnormal findings: Secondary | ICD-10-CM

## 2022-09-16 DIAGNOSIS — E782 Mixed hyperlipidemia: Secondary | ICD-10-CM

## 2022-09-16 DIAGNOSIS — Z9181 History of falling: Secondary | ICD-10-CM

## 2022-09-16 NOTE — Patient Instructions (Signed)
Edema ? ?Edema is when you have too much fluid in your body or under your skin. Edema may make your legs, feet, and ankles swell. Swelling often happens in looser tissues, such as around your eyes. This is a common condition. It gets more common as you get older. ?There are many possible causes of edema. These include: ?Eating too much salt (sodium). ?Being on your feet or sitting for a long time. ?Certain medical conditions, such as: ?Pregnancy. ?Heart failure. ?Liver disease. ?Kidney disease. ?Cancer. ?Hot weather may make edema worse. Edema is usually painless. Your skin may look swollen or shiny. ?Follow these instructions at home: ?Medicines ?Take over-the-counter and prescription medicines only as told by your doctor. ?Your doctor may prescribe a medicine to help your body get rid of extra water (diuretic). Take this medicine if you are told to take it. ?Eating and drinking ?Eat a low-salt (low-sodium) diet as told by your doctor. Sometimes, eating less salt may reduce swelling. ?Depending on the cause of your swelling, you may need to limit how much fluid you drink (fluid restriction). ?General instructions ?Raise the injured area above the level of your heart while you are sitting or lying down. ?Do not sit still or stand for a long time. ?Do not wear tight clothes. Do not wear garters on your upper legs. ?Exercise your legs. This can help the swelling go down. ?Wear compression stockings as told by your doctor. It is important that these are the right size. These should be prescribed by your doctor to prevent possible injuries. ?If elastic bandages or wraps are recommended, use them as told by your doctor. ?Contact a doctor if: ?Treatment is not working. ?You have heart, liver, or kidney disease and have symptoms of edema. ?You have sudden and unexplained weight gain. ?Get help right away if: ?You have shortness of breath or chest pain. ?You cannot breathe when you lie down. ?You have pain, redness, or  warmth in the swollen areas. ?You have heart, liver, or kidney disease and get edema all of a sudden. ?You have a fever and your symptoms get worse all of a sudden. ?These symptoms may be an emergency. Get help right away. Call 911. ?Do not wait to see if the symptoms will go away. ?Do not drive yourself to the hospital. ?Summary ?Edema is when you have too much fluid in your body or under your skin. ?Edema may make your legs, feet, and ankles swell. Swelling often happens in looser tissues, such as around your eyes. ?Raise the injured area above the level of your heart while you are sitting or lying down. ?Follow your doctor's instructions about diet and how much fluid you can drink. ?This information is not intended to replace advice given to you by your health care provider. Make sure you discuss any questions you have with your health care provider. ?Document Revised: 08/17/2021 Document Reviewed: 08/17/2021 ?Elsevier Patient Education ? 2023 [employer] ? ?

## 2022-09-16 NOTE — Progress Notes (Signed)
CPE  Assessment and Plan:  Encounter for Annual Physical Exam with abnormal findings Due annually  Health Maintenance reviewed Healthy lifestyle reviewed and goals set DEXA with mammogram - in 11/2021  Atherosclerosis of aorta ([place]) - CT 05/2020 Per CT 05/2020 Control blood pressure, cholesterol, glucose, increase exercise.   Depression, major, recurrent, in remission ([place]) Continue Wellbutrin. Reviewed relaxation techniques.  Sleep hygiene. Limit/Decrease/Monitor drug/alcohol intake.    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 Discussed goal to reduce back down to max 1/day, taper down slowly; Declines meds for this Avoid sedating medications  Senile purpura ([place]) Discussed process, protect skin, sunscreen  B12 deficiency/ Thiamine def Continue supplement Monitor levels  Mixed hyperlipidemia Discussed lifestyle modifications. Recommended diet heavy in fruits and veggies, omega 3's. Decrease consumption of animal meats, cheeses, and dairy products. Remain active and exercise as tolerated. Continue to monitor. Check lipids/TSH  H/O prosthetic aortic valve replacement/localized edema Continue follow up cardio, no concerns Prop feet when swelling TED hose  Medication management All medications discussed and reviewed in full. All questions and concerns regarding medications addressed.    Vitamin D deficiency Continue supplement. Monitor levels  Anemia, unspecified type Check and monitor CBC with anemia panel PRN  Emphysema ([place])- CT 05/2020 Per imaging, remote smoker, denies sx; monitor  Lung nodules 06/10/2020, had CT that showed stable 0.6cm left lower lobe Follow up was optional After discussion of risks and benefits patient and daughter declined further CT at this time  Lymphocytic colitis Continue Creon Continue to follow up GI  Memory loss Neuro Dr. Leta Baptist is  following 23/30 on MMSE, mood is improved,  Had persistent even with reduced alcohol, on B complex with improved labs  High fall risk No recent falls Discussed fall prevention, was referred for PT Discussed goal for reducing alcohol back down Fall prevention in home discussed   Screening for cardiovascular condition Check and monitor EKG  Screening for proteinuria or hematuria Check and monitor UA  Screening for diabetes mellitus Check and monitor A1c.   Orders Placed This Encounter  Procedures   CBC with Differential/Platelet   COMPLETE METABOLIC PANEL WITH GFR   Magnesium   Lipid panel   TSH   Hemoglobin A1c   VITAMIN D 25 Hydroxy (Vit-D Deficiency, Fractures)   Urinalysis, Routine w reflex microscopic   Vitamin B12    Notify office for further evaluation and treatment, questions or concerns if any reported s/s fail to improve.   The patient was advised to call back or seek an in-person evaluation if any symptoms worsen or if the condition fails to improve as anticipated.   Further disposition pending results of labs. Discussed med's effects and SE's.   I discussed the assessment and treatment plan with the patient. The patient was provided an opportunity to ask questions and all were answered. The patient agreed with the plan and demonstrated an understanding of the instructions.  Discussed med's effects and SE's. Screening labs and tests as requested with regular follow-up as recommended.  I provided 30 minutes of face-to-face time during this encounter including counseling, chart review, and critical decision making was preformed.   Future Appointments  Date Time Provider [place]  09/16/2022  3:00 PM Darrol Jump, NP [facility] None  09/28/2022  2:30 PM Thornton Park, MD [facility]  10/12/2022  3:00 PM Penumalli, Earlean Polka, MD [facility] None  09/20/2023  3:00 PM Darrol Jump, NP [facility] None  Plan:   During the course of the  visit the patient was educated and counseled about appropriate screening and preventive services including:   Pneumococcal vaccine  Prevnar 13 Influenza vaccine Td vaccine Screening electrocardiogram Bone densitometry screening Colorectal cancer screening Diabetes screening Glaucoma screening Nutrition counseling  Advanced directives: requested   HPI   81 y.o. female  presents to clinic accompanied by daughter for medicare wellness visit and follow up for has Alcohol abuse with alcohol-induced disorder ([place]); Medication management; Mixed hyperlipidemia; H/O prosthetic aortic valve replacement; B12 deficiency; Vitamin D deficiency; Major depression in complete remission ([place]); Lung nodules; Lymphocytic colitis; Senile purpura ([place]); Aortic atherosclerosis ([place]) - CT 05/2020; Emphysema of lung ([place]) - CT 05/2020; Dementia ([place]); Thiamine deficiency; At high risk for falls; Osteopenia; DDD (degenerative disc disease), lumbar; and Lumbar back pain on their problem list.   She is now living at [facility]. Moved on 06/27/22. Has an abassador name Dalene Seltzer.  Reports working with PT and taking workout classes.  Also playing bridge.  She has history of anxiety, alcohol abuse, memory problems; her husband had kidney cancer and passed 2020. Memory continues to be an issue, frequently asks the same questions. She continues to drink 2 glasses of wine nightly  She has seen Dr. Kathlen Mody, last visit 01/2020, normal MRI  Brain 04/2019. Last MMSE 29. 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 She has had low vitamin B levels in the past.  She was found to have lymphocytic colitis on colonoscopy in 06/2019-following with GI Dr. Tarri Glenn, well controlled.  She also had a low B1 of 7 02/2019 and is on B complex, improved to 26 at recheck, but daughter  notes this was when alcohol intake was down to 1/night.   Had CT 06/10/2020 showing stable 6 mm left lower lobe nodule, and has been asked to follow up with yearly imaging, but has not continued.  She also showed emphysematous changes and extensive aortic atherosclerosis. She is remote former smoker.  Her and daughter do not require further evaluation.   She has not had any falls in the last 3 months.  She does note mild BLE edema. Does not take in a lot of salt.   BMI is Body mass index is 18.37 kg/m., she has not been working on diet and exercise, has left more weak since stopping iron supplement. Plans to start walking with daughter.  Wt Readings from Last 3 Encounters:  09/16/22 107 lb (48.5 kg)  07/12/22 115 lb 12.8 oz (52.5 kg)  06/27/22 115 lb (52.2 kg)   Her blood pressure has been controlled at home, today their BP is BP: 102/82 She does not workout. She denies chest pain, shortness of breath, dizziness.   She has a history of hyperlipidemia on liptior and has history of bovine aortic valve replacement x 2. Last TAVR in 2016, last echo was 05/2020, following with Dr. Sallyanne Kuster. Aortic atherosclerosis per CT 05/2020.    She is on cholesterol medication (atorvastatin 80 mg daily) and denies myalgias. Her cholesterol is not at goal. The cholesterol last visit was:   Lab Results  Component Value Date   CHOL 198 05/20/2022   HDL 83 05/20/2022   LDLCALC 96 05/20/2022   TRIG 93 05/20/2022   CHOLHDL 2.4 05/20/2022   Last A1C in the office was:  Lab Results  Component Value Date  HGBA1C 5.8 (H) 01/13/2022    Last GFR:  Lab Results  Component Value Date   GFRNONAA >60 04/29/2022   Patient is on Vitamin D supplement.   Lab Results  Component Value Date   VD25OH 55 05/20/2022     She is on B complex Lab Results  Component Value Date   VITAMINB12 755 05/20/2022   She has been off of iron supplement for several months Lab Results  Component Value Date   IRON 60  08/25/2021   TIBC 300 08/25/2021   FERRITIN 108 08/25/2021     Current Medications:    Current Outpatient Medications (Cardiovascular):    atorvastatin (LIPITOR) 80 MG tablet, Take  1 tablet  Daily for Cholesterol                              /                     TAKE 1 TABLET BY MOUTH     Current Outpatient Medications (Other):    bismuth subsalicylate (PEPTO BISMOL) 262 MG/15ML suspension, Take 30 mLs by mouth every 6 (six) hours as needed.   buPROPion (WELLBUTRIN XL) 150 MG 24 hr tablet, Take 1 tablet (150 mg total) by mouth every morning.   Cholecalciferol (VITAMIN D) 125 MCG (5000 UT) CAPS, Take 1 tablet by mouth daily.   Menaquinone-7 (VITAMIN K2 PO), Take by mouth.   Multiple Vitamin (MULTIVITAMIN ADULT PO), Take 3,000 Units by mouth. Life Extensions   Pancrelipase, Lip-Prot-Amyl, (CREON) 24000-76000 units CPEP, One with meals and one with snacks  Allergies:  Allergies  Allergen Reactions   Tizanidine     Hypotension "felt like I had a stroke"    Medical History:  She has Alcohol abuse with alcohol-induced disorder ([place]); Medication management; Mixed hyperlipidemia; H/O prosthetic aortic valve replacement; B12 deficiency; Vitamin D deficiency; Major depression in complete remission ([place]); Lung nodules; Lymphocytic colitis; Senile purpura ([place]); Aortic atherosclerosis ([place]) - CT 05/2020; Emphysema of lung ([place]) - CT 05/2020; Dementia ([place]); Thiamine deficiency; At high risk for falls; Osteopenia; DDD (degenerative disc disease), lumbar; and Lumbar back pain on their problem list.   Patient Care Team: Unk Pinto, MD as PCP - General (Internal Medicine) Croitoru, Dani Gobble, MD as PCP - Cardiology (Cardiology) Leta Baptist, Earlean Polka, MD as Consulting Physician (Neurology) Newton Pigg, [facility] as Pharmacist (Pharmacist)  Surgical History:  She has a past surgical history that includes Cardiac valuve replacement (1999) and Cardiac valuve replacement (2016). Family History:   Herfamily history includes Breast cancer in her mother; Hyperlipidemia in her mother; Hypertension in her mother; Kidney disease in her father; Parkinson's disease in her father; Parkinsonism in her father; Sleep apnea in her daughter. Social History:  She reports that she quit smoking about 43 years ago. Her smoking use included cigarettes. She has never used smokeless tobacco. She reports current alcohol use of about 28.0 standard drinks of alcohol per week. She reports that she does not use drugs.  Immunization History  Administered Date(s) Administered   Influenza, High Dose Seasonal PF 09/28/2021   Moderna Covid-19 Vaccine Bivalent Booster 40yr & up 11/10/2021   PFIZER(Purple Top)SARS-COV-2 Vaccination 02/19/2020, 03/11/2020, 11/26/2020   Pneumococcal Conjugate-13 12/18/2019   Pneumococcal Polysaccharide-23 08/03/2021   Td 04/26/2020   Tdap 06/27/2022   Zoster Recombinat (Shingrix) 06/21/2022   Tetanus: 04/2020 Pneumo 23: 07/2021 Pneum 13: 11/2019 Influenza: Due 09/2022 Shingrix: plans to get at pharmacy -  plans to do cash pay Covid 19: 3/3, Tomma Rakers - has had booster   Colonoscopy 06/2019 Dr. Tarri Glenn, lymphocytic colitis, was recommended 5 year recall  EGD 06/2019  MGM 06/2020 - Due DEXA: T-Score -1.9.  11/2021  Last eye: Dr. Clydene Laming, last 2023, will schedule Last dental: Dr. Oren Binet, last 2023    Review of Systems: Review of Systems  Constitutional: Negative.  Negative for malaise/fatigue and weight loss.  HENT: Negative.  Negative for hearing loss and tinnitus.   Eyes: Negative.  Negative for blurred vision and double vision.  Respiratory: Negative.  Negative for cough, shortness of breath and wheezing.   Cardiovascular: Negative.  Negative for chest pain, palpitations, orthopnea, claudication and leg swelling.  Gastrointestinal: Negative.  Negative for abdominal pain, blood in stool, constipation, diarrhea, heartburn, melena, nausea and vomiting.  Genitourinary: Negative.    Musculoskeletal:  Positive for falls. Negative for joint pain and myalgias.  Skin: Negative.  Negative for rash.  Neurological: Negative.  Negative for dizziness, tingling, sensory change, weakness and headaches.  Endo/Heme/Allergies: Negative.  Negative for polydipsia.  Psychiatric/Behavioral:  Positive for memory loss (frequently asks the same questions) and substance abuse (back up to 1 bottle of wine/night). Negative for depression and suicidal ideas. The patient is not nervous/anxious and does not have insomnia.   All other systems reviewed and are negative.   Physical Exam: Estimated body mass index is 18.37 kg/m as calculated from the following:   Height as of this encounter: '5\' 4"'$  (1.626 m).   Weight as of this encounter: 107 lb (48.5 kg). BP 102/82   Pulse (!) 59   Temp 97.9 F (36.6 C)   Ht '5\' 4"'$  (1.626 m)   Wt 107 lb (48.5 kg)   SpO2 97%   BMI 18.37 kg/m  General Appearance: elderly frail appearing female in no apparent distress Eyes: PERRLA, EOMs, conjunctiva no swelling or erythema Sinuses: No Frontal/maxillary tenderness ENT/Mouth: Ext aud canals clear, normal light reflex with TMs without erythema, bulging.  No erythema, swelling, or exudate on post pharynx. Tonsils not swollen or erythematous. Hearing is fair.  Neck: Supple, thyroid normal. No bruits  Respiratory: Respiratory effort normal, BS equal bilaterally without rales, rhonchi, wheezing or stridor.  Cardio: RRR with 2/6 systolic murmur without rubs or gallops.  Chest: symmetric, with normal excursions and percussion.  Abdomen: Soft, nontender, no guarding, rebound, hernias, masses, or organomegaly.  Lymphatics: Non tender without lymphadenopathy.  Musculoskeletal: Full ROM all peripheral extremities, good grip strength, non-antalgic, unsteady gait.  Skin: Warm, dry; fragile without rashes, lesions; many scattered small ecchymoses to extremities.  Neuro: Cranial nerves intact,,no cog wheeling, Normal muscle  tone, wide based stance,  Neg pronator drift. Poor short term memory.  Psych: Awake and oriented X 3, normal affect, Insight and Judgment appropriate Breasts: mammogram as scheduled, no concerns GU: denies concerns, defer  EKG: Defer to Cardiology Updated 04/2022  Bexton Haak 4:44 PM [facility]

## 2022-09-17 ENCOUNTER — Other Ambulatory Visit: Payer: Self-pay | Admitting: Nurse Practitioner

## 2022-09-17 LAB — CBC WITH DIFFERENTIAL/PLATELET
Absolute Monocytes: 527 cells/uL (ref 200–950)
Basophils Absolute: 50 cells/uL (ref 0–200)
Basophils Relative: 0.8 %
Eosinophils Absolute: 112 cells/uL (ref 15–500)
Eosinophils Relative: 1.8 %
HCT: 37.1 % (ref 35.0–45.0)
Hemoglobin: 12.5 g/dL (ref 11.7–15.5)
Lymphs Abs: 1252 cells/uL (ref 850–3900)
MCH: 30.3 pg (ref 27.0–33.0)
MCHC: 33.7 g/dL (ref 32.0–36.0)
MCV: 90 fL (ref 80.0–100.0)
MPV: 12.5 fL (ref 7.5–12.5)
Monocytes Relative: 8.5 %
Neutro Abs: 4259 cells/uL (ref 1500–7800)
Neutrophils Relative %: 68.7 %
Platelets: 162 10*3/uL (ref 140–400)
RBC: 4.12 10*6/uL (ref 3.80–5.10)
RDW: 12 % (ref 11.0–15.0)
Total Lymphocyte: 20.2 %
WBC: 6.2 10*3/uL (ref 3.8–10.8)

## 2022-09-17 LAB — URINALYSIS, ROUTINE W REFLEX MICROSCOPIC
Bilirubin Urine: NEGATIVE
Glucose, UA: NEGATIVE
Nitrite: NEGATIVE
RBC / HPF: >=60 /HPF — AB (ref 0–2)
Specific Gravity, Urine: 1.024 (ref 1.001–1.035)
pH: 5.5 (ref 5.0–8.0)

## 2022-09-17 LAB — MAGNESIUM: Magnesium: 2.1 mg/dL (ref 1.5–2.5)

## 2022-09-17 LAB — COMPLETE METABOLIC PANEL WITH GFR
AG Ratio: 1.6 (calc) (ref 1.0–2.5)
ALT: 19 U/L (ref 6–29)
AST: 27 U/L (ref 10–35)
Albumin: 4 g/dL (ref 3.6–5.1)
Alkaline phosphatase (APISO): 87 U/L (ref 37–153)
BUN/Creatinine Ratio: 19 (calc) (ref 6–22)
BUN: 18 mg/dL (ref 7–25)
CO2: 28 mmol/L (ref 20–32)
Calcium: 10.1 mg/dL (ref 8.6–10.4)
Chloride: 104 mmol/L (ref 98–110)
Creat: 0.97 mg/dL — ABNORMAL HIGH (ref 0.60–0.95)
Globulin: 2.5 g/dL (calc) (ref 1.9–3.7)
Glucose, Bld: 96 mg/dL (ref 65–99)
Potassium: 4.7 mmol/L (ref 3.5–5.3)
Sodium: 141 mmol/L (ref 135–146)
Total Bilirubin: 0.7 mg/dL (ref 0.2–1.2)
Total Protein: 6.5 g/dL (ref 6.1–8.1)
eGFR: 58 mL/min/{1.73_m2} — ABNORMAL LOW (ref >=60)

## 2022-09-17 LAB — VITAMIN D 25 HYDROXY (VIT D DEFICIENCY, FRACTURES): Vit D, 25-Hydroxy: 70 ng/mL (ref 30–100)

## 2022-09-17 LAB — LIPID PANEL
Cholesterol: 159 mg/dL (ref <200)
HDL: 61 mg/dL (ref >=50)
LDL Cholesterol (Calc): 81 mg/dL (calc)
Non-HDL Cholesterol (Calc): 98 mg/dL (calc) (ref <130)
Total CHOL/HDL Ratio: 2.6 (calc) (ref <5.0)
Triglycerides: 88 mg/dL (ref <150)

## 2022-09-17 LAB — HEMOGLOBIN A1C
Hgb A1c MFr Bld: 5.4 % of total Hgb (ref <5.7)
Mean Plasma Glucose: 108 mg/dL
eAG (mmol/L): 6 mmol/L

## 2022-09-17 LAB — VITAMIN B12: Vitamin B-12: 811 pg/mL (ref 200–1100)

## 2022-09-17 LAB — MICROSCOPIC MESSAGE

## 2022-09-17 LAB — TSH: TSH: 1.43 mIU/L (ref 0.40–4.50)

## 2022-09-17 MED ORDER — NITROFURANTOIN MONOHYD MACRO 100 MG PO CAPS: 100.0000 mg | ORAL_CAPSULE | Freq: Two times a day (BID) | ORAL | 0 refills | Status: AC

## 2022-09-22 DIAGNOSIS — Z23 Encounter for immunization: Secondary | ICD-10-CM | POA: Diagnosis not present

## 2022-09-23 ENCOUNTER — Telehealth: Payer: Self-pay

## 2022-09-23 NOTE — Telephone Encounter (Signed)
LM-09/23/22-Chart Review started. Reviewing OV, consults, hospital visits, labs, and medication changes.Chart review complete. Called pts. Daughter to complete General Review Call. Unable to reach. [place] X1.  Total time spent: 10 min.

## 2022-09-28 ENCOUNTER — Telehealth: Payer: Self-pay

## 2022-09-28 ENCOUNTER — Ambulatory Visit: Payer: Medicare Other | Admitting: Gastroenterology

## 2022-09-28 NOTE — Telephone Encounter (Signed)
LM-09/28/22-Called pt. Daughter [name] to complete General Review call. Unable to reach. [place] X2.  Total time spent: 2 min.

## 2022-10-04 NOTE — Telephone Encounter (Signed)
LM-10/04/22-Called pt. daughter [name] and she was unable to complete review call because she was driving. Shaniyah Wix agreed to call when she gets a chance to complete General Review Call.   Total time spent: 4 min.

## 2022-10-06 NOTE — Telephone Encounter (Signed)
LM-10/06/22-Attempted to reach pts. Daughter [name] to see if she could complete general review call questions for patient. Marland Kitchen Unsuccessful reaching pt. Daughter. Will reach out for a final attempt next week. In last conversation I noted pts. Daughter stated she would call when she is available to complete general review call.  Total time spent: 2 min.

## 2022-10-11 ENCOUNTER — Other Ambulatory Visit: Payer: Self-pay | Admitting: Nurse Practitioner

## 2022-10-11 DIAGNOSIS — F331 Major depressive disorder, recurrent, moderate: Secondary | ICD-10-CM

## 2022-10-11 NOTE — Telephone Encounter (Signed)
LM-10/11/22-Called pt. Daughter [name] to complete General Review Call. Unable to reach. [place].  Total time spent: 2 min.

## 2022-10-12 ENCOUNTER — Encounter: Payer: Self-pay | Admitting: Diagnostic Neuroimaging

## 2022-10-12 ENCOUNTER — Ambulatory Visit (INDEPENDENT_AMBULATORY_CARE_PROVIDER_SITE_OTHER): Payer: Medicare Other | Admitting: Diagnostic Neuroimaging

## 2022-10-12 VITALS — BP 122/76 | HR 86 | Ht 64.0 in | Wt 109.4 lb

## 2022-10-12 DIAGNOSIS — F03A18 Unspecified dementia, mild, with other behavioral disturbance: Secondary | ICD-10-CM | POA: Diagnosis not present

## 2022-10-12 NOTE — Patient Instructions (Signed)
  MEMORY LOSS (mild dementia; depression, alcohol abuse were also contributing factors) - continue memantine '10mg'$  twice a day  - safety / supervision issues reviewed - daily physical activity / exercise (at least 15-30 minutes) - eat more plants / vegetables - increase social activities, brain stimulation, games, puzzles, hobbies, crafts, arts, music - aim for at least 7-8 hours sleep per night (or more) - avoid smoking and alcohol - caregiver resources provided - caution with medications, finances; no driving

## 2022-10-12 NOTE — Progress Notes (Signed)
[facility]  PATIENT: Connie Alvarado DOB: 1941-04-06  REFERRING CLINICIAN: Unk Pinto, MD HISTORY FROM: patient and daughter  REASON FOR VISIT: follow up   HISTORICAL  CHIEF COMPLAINT:  Chief Complaint  Patient presents with   Dementia    Rm 7, one year FU, dgtr- Lauren, lives at [facility], [place] [address] "dgtr states she is hearing music in her head, makes it difficult to sleep some nights"    HISTORY OF PRESENT ILLNESS:   UPDATE (10/12/22, VRP): Since last visit, doing about the same to slightly more memory issues. Some more help from daughter for reminders on breakfast and medications. Now living at [facility] since July 2023.   UPDATE (08/26/21, VRP): Since last visit,more progression of memory symptoms. Especially in last 2 weeks. Word finding diff, repeating herself. PCP lab testing is good from yesterday. Less active. Less appetite.   PRIOR HPI (02/06/20): 81 year old female here for evaluation of memory loss. Patient denies any significant memory loss problems.  She does have some mild memory loss problems but she feels like she is able to do all of her day-to-day functioning without difficulty.  She has had a stressful year since her husband passed away in 02/16/2019.  Also she moved from [place] to [place] in 2019 for medical care for her husband.  Since that time she has sold her house in [place] and stayed in [place] to live with her daughter.  Patient has struggled with depression and alcohol abuse.  Patient has been drinking up to 1 bottle of wine per day for the past few years.  This gradually increased when patient retired in her 62s.  Daughter notes more problems with short-term memory loss, difficulty operating her phone, difficulty understanding complex financial transactions, decreased motivation and multitasking.  Initially daughter thought this was related to alcohol abuse but in times where patient has not been  drinking, patient continued to have these memory problems.   01/16/2020 patient had excessive alcohol use, stumbled and fell down.  She went to the emergency room for evaluation.  Since that time she has not had any alcohol use.  Memory symptoms are stable.   REVIEW OF SYSTEMS: Full 14 system review of systems performed and negative with exception of: As per HPI.  ALLERGIES: Allergies  Allergen Reactions   Tizanidine     Hypotension "felt like I had a stroke"     HOME MEDICATIONS: Outpatient Medications Prior to Visit  Medication Sig Dispense Refill   atorvastatin (LIPITOR) 80 MG tablet Take  1 tablet  Daily for Cholesterol                              /                     TAKE 1 TABLET BY MOUTH 90 tablet 3   bismuth subsalicylate (PEPTO BISMOL) 262 MG/15ML suspension Take 30 mLs by mouth every 6 (six) hours as needed.     buPROPion (WELLBUTRIN XL) 150 MG 24 hr tablet TAKE 1 TABLET(150 MG) BY MOUTH EVERY MORNING 30 tablet 2   Cholecalciferol (VITAMIN D) 125 MCG (5000 UT) CAPS Take 1 tablet by mouth daily.     Menaquinone-7 (VITAMIN K2 PO) Take by mouth.     Multiple Vitamin (MULTIVITAMIN ADULT PO) Take 3,000 Units by mouth. Life Extensions     Pancrelipase, Lip-Prot-Amyl, (CREON) 82993-71696 units CPEP One with  meals and one with snacks 180 capsule 3   No facility-administered medications prior to visit.    PAST MEDICAL HISTORY: Past Medical History:  Diagnosis Date   Alcohol abuse    Anxiety    Depression    Hyperlipidemia    Lymphocytic colitis    Malaria    as a child   Memory loss     PAST SURGICAL HISTORY: Past Surgical History:  Procedure Laterality Date   CARDIAC VALVE SURGERY  1999   Aortic valve   CARDIAC VALVE SURGERY  2016   Aortic valve    FAMILY HISTORY: Family History  Problem Relation Age of Onset   Hypertension Mother    Hyperlipidemia Mother    Breast cancer Mother    Parkinson's disease Father    Kidney disease Father    Parkinsonism Father     Sleep apnea Daughter    Colon cancer Neg Hx    Esophageal cancer Neg Hx    Rectal cancer Neg Hx    Stomach cancer Neg Hx     SOCIAL HISTORY: Social History   Socioeconomic History   Marital status: Widowed    Spouse name: Not on file   Number of children: 1   Years of education: Not on file   Highest education level: Bachelor's degree (e.g., BA, AB, BS)  Occupational History    Comment: retired  Tobacco Use   Smoking status: Former    Types: Cigarettes    Quit date: 1980    Years since quitting: 43.8   Smokeless tobacco: Never  Vaping Use   Vaping Use: Never used  Substance and Sexual Activity   Alcohol use: Yes    Alcohol/week: 28.0 standard drinks of alcohol    Types: 28 Standard drinks or equivalent per week    Comment: 2 glasses nightly   Drug use: Never   Sexual activity: Not Currently  Other Topics Concern   Not on file  Social History Narrative   10/12/22 lives at [facility]   Caffeine- coffee 2-3 c daily   Social Determinants of Health   Financial Resource Strain: Not on file  Food Insecurity: Not on file  Transportation Needs: Not on file  Physical Activity: Not on file  Stress: Not on file  Social Connections: Not on file  Intimate Partner Violence: Not on file     PHYSICAL EXAM  GENERAL EXAM/CONSTITUTIONAL: Vitals:  Vitals:   10/12/22 1441  BP: 122/76  Pulse: 86  Weight: 109 lb 6.4 oz (49.6 kg)  Height: '5\' 4"'$  (1.626 m)   Body mass index is 18.78 kg/m. Wt Readings from Last 3 Encounters:  10/12/22 109 lb 6.4 oz (49.6 kg)  09/16/22 107 lb (48.5 kg)  07/12/22 115 lb 12.8 oz (52.5 kg)   Patient is in no distress; well developed, nourished and groomed; neck is supple  CARDIOVASCULAR: Examination of carotid arteries is normal; no carotid bruits Regular rate and rhythm, no murmurs Examination of peripheral vascular system by observation and palpation is normal  EYES: Ophthalmoscopic exam of optic discs and posterior segments is  normal; no papilledema or hemorrhages No results found.  MUSCULOSKELETAL: Gait, strength, tone, movements noted in Neurologic exam below  NEUROLOGIC: MENTAL STATUS:     10/12/2022    2:45 PM 07/12/2022    3:59 PM 08/26/2021    3:15 PM  MMSE - Mini Mental State Exam  Orientation to time 0 2 2  Orientation to Place '4 4 4  '$ Registration 3 3 2  Attention/ Calculation '2 5 3  '$ Recall 0 0 3  Language- name 2 objects '2 2 2  '$ Language- repeat '1 1 1  '$ Language- follow 3 step command '3 3 3  '$ Language- read & follow direction '1 1 1  '$ Write a sentence '1 1 1  '$ Copy design '1 1 1  '$ Total score '18 23 23   '$ awake, alert, oriented to person [place] attention and concentration language fluent, comprehension intact, naming intact fund of knowledge appropriate  CRANIAL NERVE:  2nd - no papilledema on fundoscopic exam 2nd, 3rd, 4th, 6th - pupils equal and reactive to light, visual fields full to confrontation, extraocular muscles intact, no nystagmus 5th - facial sensation symmetric 7th - facial strength symmetric 8th - hearing intact 9th - palate elevates symmetrically, uvula midline 11th - shoulder shrug symmetric 12th - tongue protrusion midline  MOTOR:  normal bulk and tone, full strength in the BUE, BLE  SENSORY:  normal and symmetric to light touch, temperature, vibration  COORDINATION:  finger-nose-finger, fine finger movements normal  REFLEXES:  deep tendon reflexes present and symmetric  GAIT/STATION:  narrow based gait    DIAGNOSTIC DATA (LABS, IMAGING, TESTING) - I reviewed patient records, labs, notes, testing and imaging myself where available.  Lab Results  Component Value Date   WBC 6.2 09/16/2022   HGB 12.5 09/16/2022   HCT 37.1 09/16/2022   MCV 90.0 09/16/2022   PLT 162 09/16/2022      Component Value Date/Time   NA 141 09/16/2022 1538   K 4.7 09/16/2022 1538   CL 104 09/16/2022 1538   CO2 28 09/16/2022 1538   GLUCOSE 96 09/16/2022 1538   BUN 18  09/16/2022 1538   CREATININE 0.97 (H) 09/16/2022 1538   CALCIUM 10.1 09/16/2022 1538   PROT 6.5 09/16/2022 1538   ALBUMIN 3.2 (L) 04/29/2022 0736   AST 27 09/16/2022 1538   ALT 19 09/16/2022 1538   ALKPHOS 53 04/29/2022 0736   BILITOT 0.7 09/16/2022 1538   GFRNONAA >60 04/29/2022 0736   GFRNONAA 88 05/14/2021 1242   GFRAA 102 05/14/2021 1242   Lab Results  Component Value Date   CHOL 159 09/16/2022   HDL 61 09/16/2022   LDLCALC 81 09/16/2022   TRIG 88 09/16/2022   CHOLHDL 2.6 09/16/2022   Lab Results  Component Value Date   HGBA1C 5.4 09/16/2022   Lab Results  Component Value Date   VQQVZDGL87 564 09/16/2022   Lab Results  Component Value Date   TSH 1.43 09/16/2022    05/17/19 MRI brain [I reviewed images myself and agree with interpretation. -VRP]  - Generalized atrophy, not unexpected for age. - Moderately advanced T2 and FLAIR hyperintensities in the white matter consistent with chronic microvascular ischemic change. - No acute intracranial findings. No concerning abnormal postcontrast enhancement. 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  01/25/20 CT lumbar spine [I reviewed images myself and agree with interpretation. -VRP]  1. Findings consistent with a very tiny compression fracture of the anterior superior aspect of the T12 vertebral body, new since 05/14/2019. 2. 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 3. Mild to moderate spinal stenosis at L3-4, unchanged. 4. 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  09/09/21 MRI brain (without) demonstrating: -Moderate atrophy and moderate chronic small vessel ischemic disease. -Few chronic cerebral microhemorrhages. -No acute findings.  Overall stable  compared to 05/17/2019.   ASSESSMENT AND PLAN  81 y.o. year old female here with 1 to 2  years of short-term memory loss, difficulty with learning new tasks, decreased motivation, decreased interest, also in the setting of depression and alcohol abuse.  Dx:  1. Mild dementia with other behavioral disturbance, unspecified dementia type ([place])     PLAN:  MEMORY LOSS (mild dementia; depression, alcohol abuse were also contributing factors; tried memantine but didn't notice that much help, so stopped it) - safety / supervision issues reviewed - daily physical activity / exercise (at least 15-30 minutes) - eat more plants / vegetables - increase social activities, brain stimulation, games, puzzles, hobbies, crafts, arts, music - aim for at least 7-8 hours sleep per night (or more) - avoid smoking and alcohol - caregiver resources provided - caution with medications, finances; no driving  Return for return to PCP.    [name], MD 56/38/7564, 3:32 PM Certified in Neurology, Neurophysiology and Neuroimaging  [facility] [address] [place],  [address] [phone]

## 2022-10-15 ENCOUNTER — Encounter: Payer: Self-pay | Admitting: Internal Medicine

## 2022-10-21 DIAGNOSIS — M6281 Muscle weakness (generalized): Secondary | ICD-10-CM | POA: Diagnosis not present

## 2022-10-21 DIAGNOSIS — R2689 Other abnormalities of gait and mobility: Secondary | ICD-10-CM | POA: Diagnosis not present

## 2022-10-22 DIAGNOSIS — M6281 Muscle weakness (generalized): Secondary | ICD-10-CM | POA: Diagnosis not present

## 2022-10-22 DIAGNOSIS — R2689 Other abnormalities of gait and mobility: Secondary | ICD-10-CM | POA: Diagnosis not present

## 2022-10-25 DIAGNOSIS — R2689 Other abnormalities of gait and mobility: Secondary | ICD-10-CM | POA: Diagnosis not present

## 2022-10-25 DIAGNOSIS — M6281 Muscle weakness (generalized): Secondary | ICD-10-CM | POA: Diagnosis not present

## 2022-10-26 DIAGNOSIS — R2689 Other abnormalities of gait and mobility: Secondary | ICD-10-CM | POA: Diagnosis not present

## 2022-10-26 DIAGNOSIS — M6281 Muscle weakness (generalized): Secondary | ICD-10-CM | POA: Diagnosis not present

## 2022-10-27 DIAGNOSIS — M6281 Muscle weakness (generalized): Secondary | ICD-10-CM | POA: Diagnosis not present

## 2022-10-27 DIAGNOSIS — R2689 Other abnormalities of gait and mobility: Secondary | ICD-10-CM | POA: Diagnosis not present

## 2022-10-27 DIAGNOSIS — R4789 Other speech disturbances: Secondary | ICD-10-CM | POA: Diagnosis not present

## 2022-10-27 DIAGNOSIS — R488 Other symbolic dysfunctions: Secondary | ICD-10-CM | POA: Diagnosis not present

## 2022-10-28 DIAGNOSIS — R4789 Other speech disturbances: Secondary | ICD-10-CM | POA: Diagnosis not present

## 2022-10-28 DIAGNOSIS — M6281 Muscle weakness (generalized): Secondary | ICD-10-CM | POA: Diagnosis not present

## 2022-10-28 DIAGNOSIS — R2689 Other abnormalities of gait and mobility: Secondary | ICD-10-CM | POA: Diagnosis not present

## 2022-10-28 DIAGNOSIS — R488 Other symbolic dysfunctions: Secondary | ICD-10-CM | POA: Diagnosis not present

## 2022-10-29 DIAGNOSIS — M6281 Muscle weakness (generalized): Secondary | ICD-10-CM | POA: Diagnosis not present

## 2022-10-29 DIAGNOSIS — R488 Other symbolic dysfunctions: Secondary | ICD-10-CM | POA: Diagnosis not present

## 2022-10-29 DIAGNOSIS — R2689 Other abnormalities of gait and mobility: Secondary | ICD-10-CM | POA: Diagnosis not present

## 2022-10-29 DIAGNOSIS — R4789 Other speech disturbances: Secondary | ICD-10-CM | POA: Diagnosis not present

## 2022-11-01 DIAGNOSIS — R2689 Other abnormalities of gait and mobility: Secondary | ICD-10-CM | POA: Diagnosis not present

## 2022-11-01 DIAGNOSIS — R488 Other symbolic dysfunctions: Secondary | ICD-10-CM | POA: Diagnosis not present

## 2022-11-01 DIAGNOSIS — R4789 Other speech disturbances: Secondary | ICD-10-CM | POA: Diagnosis not present

## 2022-11-01 DIAGNOSIS — M6281 Muscle weakness (generalized): Secondary | ICD-10-CM | POA: Diagnosis not present

## 2022-11-02 DIAGNOSIS — R2689 Other abnormalities of gait and mobility: Secondary | ICD-10-CM | POA: Diagnosis not present

## 2022-11-02 DIAGNOSIS — R488 Other symbolic dysfunctions: Secondary | ICD-10-CM | POA: Diagnosis not present

## 2022-11-02 DIAGNOSIS — R4789 Other speech disturbances: Secondary | ICD-10-CM | POA: Diagnosis not present

## 2022-11-02 DIAGNOSIS — M6281 Muscle weakness (generalized): Secondary | ICD-10-CM | POA: Diagnosis not present

## 2022-11-03 DIAGNOSIS — R488 Other symbolic dysfunctions: Secondary | ICD-10-CM | POA: Diagnosis not present

## 2022-11-03 DIAGNOSIS — R2689 Other abnormalities of gait and mobility: Secondary | ICD-10-CM | POA: Diagnosis not present

## 2022-11-03 DIAGNOSIS — M6281 Muscle weakness (generalized): Secondary | ICD-10-CM | POA: Diagnosis not present

## 2022-11-03 DIAGNOSIS — R4789 Other speech disturbances: Secondary | ICD-10-CM | POA: Diagnosis not present

## 2022-11-05 DIAGNOSIS — M6281 Muscle weakness (generalized): Secondary | ICD-10-CM | POA: Diagnosis not present

## 2022-11-05 DIAGNOSIS — R488 Other symbolic dysfunctions: Secondary | ICD-10-CM | POA: Diagnosis not present

## 2022-11-05 DIAGNOSIS — R2689 Other abnormalities of gait and mobility: Secondary | ICD-10-CM | POA: Diagnosis not present

## 2022-11-05 DIAGNOSIS — R4789 Other speech disturbances: Secondary | ICD-10-CM | POA: Diagnosis not present

## 2022-11-08 DIAGNOSIS — M6281 Muscle weakness (generalized): Secondary | ICD-10-CM | POA: Diagnosis not present

## 2022-11-08 DIAGNOSIS — R2689 Other abnormalities of gait and mobility: Secondary | ICD-10-CM | POA: Diagnosis not present

## 2022-11-08 DIAGNOSIS — R4789 Other speech disturbances: Secondary | ICD-10-CM | POA: Diagnosis not present

## 2022-11-08 DIAGNOSIS — R488 Other symbolic dysfunctions: Secondary | ICD-10-CM | POA: Diagnosis not present

## 2022-11-09 ENCOUNTER — Telehealth: Payer: Self-pay

## 2022-11-09 DIAGNOSIS — R2689 Other abnormalities of gait and mobility: Secondary | ICD-10-CM | POA: Diagnosis not present

## 2022-11-09 DIAGNOSIS — R4789 Other speech disturbances: Secondary | ICD-10-CM | POA: Diagnosis not present

## 2022-11-09 DIAGNOSIS — R488 Other symbolic dysfunctions: Secondary | ICD-10-CM | POA: Diagnosis not present

## 2022-11-09 DIAGNOSIS — M6281 Muscle weakness (generalized): Secondary | ICD-10-CM | POA: Diagnosis not present

## 2022-11-09 NOTE — Telephone Encounter (Signed)
Received fax from [facility] Physician Order to be signed and faxed back. Placed on MD desk.

## 2022-11-10 DIAGNOSIS — R2689 Other abnormalities of gait and mobility: Secondary | ICD-10-CM | POA: Diagnosis not present

## 2022-11-10 DIAGNOSIS — M6281 Muscle weakness (generalized): Secondary | ICD-10-CM | POA: Diagnosis not present

## 2022-11-10 DIAGNOSIS — R4789 Other speech disturbances: Secondary | ICD-10-CM | POA: Diagnosis not present

## 2022-11-10 DIAGNOSIS — R488 Other symbolic dysfunctions: Secondary | ICD-10-CM | POA: Diagnosis not present

## 2022-11-11 DIAGNOSIS — R488 Other symbolic dysfunctions: Secondary | ICD-10-CM | POA: Diagnosis not present

## 2022-11-11 DIAGNOSIS — R2689 Other abnormalities of gait and mobility: Secondary | ICD-10-CM | POA: Diagnosis not present

## 2022-11-11 DIAGNOSIS — R4789 Other speech disturbances: Secondary | ICD-10-CM | POA: Diagnosis not present

## 2022-11-11 DIAGNOSIS — M6281 Muscle weakness (generalized): Secondary | ICD-10-CM | POA: Diagnosis not present

## 2022-11-12 DIAGNOSIS — R4789 Other speech disturbances: Secondary | ICD-10-CM | POA: Diagnosis not present

## 2022-11-12 DIAGNOSIS — M6281 Muscle weakness (generalized): Secondary | ICD-10-CM | POA: Diagnosis not present

## 2022-11-12 DIAGNOSIS — R488 Other symbolic dysfunctions: Secondary | ICD-10-CM | POA: Diagnosis not present

## 2022-11-12 DIAGNOSIS — R2689 Other abnormalities of gait and mobility: Secondary | ICD-10-CM | POA: Diagnosis not present

## 2022-11-15 DIAGNOSIS — R488 Other symbolic dysfunctions: Secondary | ICD-10-CM | POA: Diagnosis not present

## 2022-11-15 DIAGNOSIS — M6281 Muscle weakness (generalized): Secondary | ICD-10-CM | POA: Diagnosis not present

## 2022-11-15 DIAGNOSIS — R4789 Other speech disturbances: Secondary | ICD-10-CM | POA: Diagnosis not present

## 2022-11-15 DIAGNOSIS — R2689 Other abnormalities of gait and mobility: Secondary | ICD-10-CM | POA: Diagnosis not present

## 2022-11-16 DIAGNOSIS — R488 Other symbolic dysfunctions: Secondary | ICD-10-CM | POA: Diagnosis not present

## 2022-11-16 DIAGNOSIS — R4789 Other speech disturbances: Secondary | ICD-10-CM | POA: Diagnosis not present

## 2022-11-16 DIAGNOSIS — M6281 Muscle weakness (generalized): Secondary | ICD-10-CM | POA: Diagnosis not present

## 2022-11-16 DIAGNOSIS — R2689 Other abnormalities of gait and mobility: Secondary | ICD-10-CM | POA: Diagnosis not present

## 2022-11-19 DIAGNOSIS — M6281 Muscle weakness (generalized): Secondary | ICD-10-CM | POA: Diagnosis not present

## 2022-11-19 DIAGNOSIS — R4789 Other speech disturbances: Secondary | ICD-10-CM | POA: Diagnosis not present

## 2022-11-19 DIAGNOSIS — R488 Other symbolic dysfunctions: Secondary | ICD-10-CM | POA: Diagnosis not present

## 2022-11-19 DIAGNOSIS — R2689 Other abnormalities of gait and mobility: Secondary | ICD-10-CM | POA: Diagnosis not present

## 2022-11-22 DIAGNOSIS — R4789 Other speech disturbances: Secondary | ICD-10-CM | POA: Diagnosis not present

## 2022-11-22 DIAGNOSIS — M6281 Muscle weakness (generalized): Secondary | ICD-10-CM | POA: Diagnosis not present

## 2022-11-22 DIAGNOSIS — R488 Other symbolic dysfunctions: Secondary | ICD-10-CM | POA: Diagnosis not present

## 2022-11-22 DIAGNOSIS — R2689 Other abnormalities of gait and mobility: Secondary | ICD-10-CM | POA: Diagnosis not present

## 2022-11-23 DIAGNOSIS — R2689 Other abnormalities of gait and mobility: Secondary | ICD-10-CM | POA: Diagnosis not present

## 2022-11-23 DIAGNOSIS — R488 Other symbolic dysfunctions: Secondary | ICD-10-CM | POA: Diagnosis not present

## 2022-11-23 DIAGNOSIS — R4789 Other speech disturbances: Secondary | ICD-10-CM | POA: Diagnosis not present

## 2022-11-23 DIAGNOSIS — M6281 Muscle weakness (generalized): Secondary | ICD-10-CM | POA: Diagnosis not present

## 2022-11-24 DIAGNOSIS — R488 Other symbolic dysfunctions: Secondary | ICD-10-CM | POA: Diagnosis not present

## 2022-11-24 DIAGNOSIS — R4789 Other speech disturbances: Secondary | ICD-10-CM | POA: Diagnosis not present

## 2022-11-24 DIAGNOSIS — M6281 Muscle weakness (generalized): Secondary | ICD-10-CM | POA: Diagnosis not present

## 2022-11-24 DIAGNOSIS — R2689 Other abnormalities of gait and mobility: Secondary | ICD-10-CM | POA: Diagnosis not present

## 2022-11-25 DIAGNOSIS — M6281 Muscle weakness (generalized): Secondary | ICD-10-CM | POA: Diagnosis not present

## 2022-11-25 DIAGNOSIS — R488 Other symbolic dysfunctions: Secondary | ICD-10-CM | POA: Diagnosis not present

## 2022-11-25 DIAGNOSIS — R4789 Other speech disturbances: Secondary | ICD-10-CM | POA: Diagnosis not present

## 2022-11-25 DIAGNOSIS — R2689 Other abnormalities of gait and mobility: Secondary | ICD-10-CM | POA: Diagnosis not present

## 2022-11-26 DIAGNOSIS — M6281 Muscle weakness (generalized): Secondary | ICD-10-CM | POA: Diagnosis not present

## 2022-11-26 DIAGNOSIS — R4789 Other speech disturbances: Secondary | ICD-10-CM | POA: Diagnosis not present

## 2022-11-26 DIAGNOSIS — R2689 Other abnormalities of gait and mobility: Secondary | ICD-10-CM | POA: Diagnosis not present

## 2022-11-26 DIAGNOSIS — R488 Other symbolic dysfunctions: Secondary | ICD-10-CM | POA: Diagnosis not present

## 2022-11-29 DIAGNOSIS — R488 Other symbolic dysfunctions: Secondary | ICD-10-CM | POA: Diagnosis not present

## 2022-11-29 DIAGNOSIS — M6281 Muscle weakness (generalized): Secondary | ICD-10-CM | POA: Diagnosis not present

## 2022-11-29 DIAGNOSIS — R2689 Other abnormalities of gait and mobility: Secondary | ICD-10-CM | POA: Diagnosis not present

## 2022-11-29 DIAGNOSIS — R4789 Other speech disturbances: Secondary | ICD-10-CM | POA: Diagnosis not present

## 2022-11-30 DIAGNOSIS — R4789 Other speech disturbances: Secondary | ICD-10-CM | POA: Diagnosis not present

## 2022-11-30 DIAGNOSIS — R488 Other symbolic dysfunctions: Secondary | ICD-10-CM | POA: Diagnosis not present

## 2022-11-30 DIAGNOSIS — M6281 Muscle weakness (generalized): Secondary | ICD-10-CM | POA: Diagnosis not present

## 2022-11-30 DIAGNOSIS — R2689 Other abnormalities of gait and mobility: Secondary | ICD-10-CM | POA: Diagnosis not present

## 2022-12-01 DIAGNOSIS — R488 Other symbolic dysfunctions: Secondary | ICD-10-CM | POA: Diagnosis not present

## 2022-12-01 DIAGNOSIS — M6281 Muscle weakness (generalized): Secondary | ICD-10-CM | POA: Diagnosis not present

## 2022-12-01 DIAGNOSIS — R2689 Other abnormalities of gait and mobility: Secondary | ICD-10-CM | POA: Diagnosis not present

## 2022-12-01 DIAGNOSIS — R4789 Other speech disturbances: Secondary | ICD-10-CM | POA: Diagnosis not present

## 2022-12-02 DIAGNOSIS — R4789 Other speech disturbances: Secondary | ICD-10-CM | POA: Diagnosis not present

## 2022-12-02 DIAGNOSIS — R2689 Other abnormalities of gait and mobility: Secondary | ICD-10-CM | POA: Diagnosis not present

## 2022-12-02 DIAGNOSIS — R488 Other symbolic dysfunctions: Secondary | ICD-10-CM | POA: Diagnosis not present

## 2022-12-02 DIAGNOSIS — M6281 Muscle weakness (generalized): Secondary | ICD-10-CM | POA: Diagnosis not present

## 2022-12-03 DIAGNOSIS — M6281 Muscle weakness (generalized): Secondary | ICD-10-CM | POA: Diagnosis not present

## 2022-12-03 DIAGNOSIS — R488 Other symbolic dysfunctions: Secondary | ICD-10-CM | POA: Diagnosis not present

## 2022-12-03 DIAGNOSIS — R4789 Other speech disturbances: Secondary | ICD-10-CM | POA: Diagnosis not present

## 2022-12-03 DIAGNOSIS — R2689 Other abnormalities of gait and mobility: Secondary | ICD-10-CM | POA: Diagnosis not present

## 2022-12-06 DIAGNOSIS — R488 Other symbolic dysfunctions: Secondary | ICD-10-CM | POA: Diagnosis not present

## 2022-12-06 DIAGNOSIS — R4789 Other speech disturbances: Secondary | ICD-10-CM | POA: Diagnosis not present

## 2022-12-06 DIAGNOSIS — M67912 Unspecified disorder of synovium and tendon, left shoulder: Secondary | ICD-10-CM | POA: Diagnosis not present

## 2022-12-06 DIAGNOSIS — R2689 Other abnormalities of gait and mobility: Secondary | ICD-10-CM | POA: Diagnosis not present

## 2022-12-06 DIAGNOSIS — M6281 Muscle weakness (generalized): Secondary | ICD-10-CM | POA: Diagnosis not present

## 2022-12-07 DIAGNOSIS — R2689 Other abnormalities of gait and mobility: Secondary | ICD-10-CM | POA: Diagnosis not present

## 2022-12-07 DIAGNOSIS — R488 Other symbolic dysfunctions: Secondary | ICD-10-CM | POA: Diagnosis not present

## 2022-12-07 DIAGNOSIS — M6281 Muscle weakness (generalized): Secondary | ICD-10-CM | POA: Diagnosis not present

## 2022-12-07 DIAGNOSIS — R4789 Other speech disturbances: Secondary | ICD-10-CM | POA: Diagnosis not present

## 2022-12-08 DIAGNOSIS — R4789 Other speech disturbances: Secondary | ICD-10-CM | POA: Diagnosis not present

## 2022-12-08 DIAGNOSIS — M6281 Muscle weakness (generalized): Secondary | ICD-10-CM | POA: Diagnosis not present

## 2022-12-08 DIAGNOSIS — R488 Other symbolic dysfunctions: Secondary | ICD-10-CM | POA: Diagnosis not present

## 2022-12-08 DIAGNOSIS — R2689 Other abnormalities of gait and mobility: Secondary | ICD-10-CM | POA: Diagnosis not present

## 2022-12-09 DIAGNOSIS — R2689 Other abnormalities of gait and mobility: Secondary | ICD-10-CM | POA: Diagnosis not present

## 2022-12-09 DIAGNOSIS — R4789 Other speech disturbances: Secondary | ICD-10-CM | POA: Diagnosis not present

## 2022-12-09 DIAGNOSIS — R488 Other symbolic dysfunctions: Secondary | ICD-10-CM | POA: Diagnosis not present

## 2022-12-09 DIAGNOSIS — M6281 Muscle weakness (generalized): Secondary | ICD-10-CM | POA: Diagnosis not present

## 2022-12-10 DIAGNOSIS — R488 Other symbolic dysfunctions: Secondary | ICD-10-CM | POA: Diagnosis not present

## 2022-12-10 DIAGNOSIS — R4789 Other speech disturbances: Secondary | ICD-10-CM | POA: Diagnosis not present

## 2022-12-10 DIAGNOSIS — R2689 Other abnormalities of gait and mobility: Secondary | ICD-10-CM | POA: Diagnosis not present

## 2022-12-10 DIAGNOSIS — M6281 Muscle weakness (generalized): Secondary | ICD-10-CM | POA: Diagnosis not present

## 2022-12-13 DIAGNOSIS — R2689 Other abnormalities of gait and mobility: Secondary | ICD-10-CM | POA: Diagnosis not present

## 2022-12-13 DIAGNOSIS — R488 Other symbolic dysfunctions: Secondary | ICD-10-CM | POA: Diagnosis not present

## 2022-12-13 DIAGNOSIS — R4789 Other speech disturbances: Secondary | ICD-10-CM | POA: Diagnosis not present

## 2022-12-13 DIAGNOSIS — M6281 Muscle weakness (generalized): Secondary | ICD-10-CM | POA: Diagnosis not present

## 2022-12-14 DIAGNOSIS — R2689 Other abnormalities of gait and mobility: Secondary | ICD-10-CM | POA: Diagnosis not present

## 2022-12-14 DIAGNOSIS — R488 Other symbolic dysfunctions: Secondary | ICD-10-CM | POA: Diagnosis not present

## 2022-12-14 DIAGNOSIS — M6281 Muscle weakness (generalized): Secondary | ICD-10-CM | POA: Diagnosis not present

## 2022-12-14 DIAGNOSIS — R4789 Other speech disturbances: Secondary | ICD-10-CM | POA: Diagnosis not present

## 2022-12-15 DIAGNOSIS — R4789 Other speech disturbances: Secondary | ICD-10-CM | POA: Diagnosis not present

## 2022-12-15 DIAGNOSIS — M6281 Muscle weakness (generalized): Secondary | ICD-10-CM | POA: Diagnosis not present

## 2022-12-15 DIAGNOSIS — R2689 Other abnormalities of gait and mobility: Secondary | ICD-10-CM | POA: Diagnosis not present

## 2022-12-15 DIAGNOSIS — R488 Other symbolic dysfunctions: Secondary | ICD-10-CM | POA: Diagnosis not present

## 2022-12-16 ENCOUNTER — Ambulatory Visit: Payer: Medicare Other | Admitting: Internal Medicine

## 2022-12-17 DIAGNOSIS — M6281 Muscle weakness (generalized): Secondary | ICD-10-CM | POA: Diagnosis not present

## 2022-12-17 DIAGNOSIS — R2689 Other abnormalities of gait and mobility: Secondary | ICD-10-CM | POA: Diagnosis not present

## 2022-12-17 DIAGNOSIS — R488 Other symbolic dysfunctions: Secondary | ICD-10-CM | POA: Diagnosis not present

## 2022-12-17 DIAGNOSIS — R4789 Other speech disturbances: Secondary | ICD-10-CM | POA: Diagnosis not present

## 2022-12-21 DIAGNOSIS — R2689 Other abnormalities of gait and mobility: Secondary | ICD-10-CM | POA: Diagnosis not present

## 2022-12-21 DIAGNOSIS — R488 Other symbolic dysfunctions: Secondary | ICD-10-CM | POA: Diagnosis not present

## 2022-12-21 DIAGNOSIS — M6281 Muscle weakness (generalized): Secondary | ICD-10-CM | POA: Diagnosis not present

## 2022-12-21 DIAGNOSIS — R4789 Other speech disturbances: Secondary | ICD-10-CM | POA: Diagnosis not present

## 2022-12-22 DIAGNOSIS — R4789 Other speech disturbances: Secondary | ICD-10-CM | POA: Diagnosis not present

## 2022-12-22 DIAGNOSIS — R488 Other symbolic dysfunctions: Secondary | ICD-10-CM | POA: Diagnosis not present

## 2022-12-22 DIAGNOSIS — R2689 Other abnormalities of gait and mobility: Secondary | ICD-10-CM | POA: Diagnosis not present

## 2022-12-22 DIAGNOSIS — M6281 Muscle weakness (generalized): Secondary | ICD-10-CM | POA: Diagnosis not present

## 2022-12-23 DIAGNOSIS — R488 Other symbolic dysfunctions: Secondary | ICD-10-CM | POA: Diagnosis not present

## 2022-12-23 DIAGNOSIS — R2689 Other abnormalities of gait and mobility: Secondary | ICD-10-CM | POA: Diagnosis not present

## 2022-12-23 DIAGNOSIS — R4789 Other speech disturbances: Secondary | ICD-10-CM | POA: Diagnosis not present

## 2022-12-23 DIAGNOSIS — M6281 Muscle weakness (generalized): Secondary | ICD-10-CM | POA: Diagnosis not present

## 2022-12-27 NOTE — Progress Notes (Unsigned)
Future Appointments  Date Time Provider Department  12/28/2022                  3 mo ov   3:30 PM Unk Pinto, MD [facility]  09/20/2023                cpe  3:00 PM Darrol Jump, NP [facility]     History of Present Illness:       This very nice 82 y.o. WWF presents for 3 month follow up for expectant monitoring of elevated BP,   HLD, Pre-Diabetes and Vitamin D Deficiency.   Patient has Dementia  and is followed by Dr Leta Baptist.  Patient is now living at [facility] since July 2023.  CV scan in 2021 showed Aortic Atherosclerosis         Patient is followed expectantly for labile HTN . Today's BP  is at goal -                . Patient has had no complaints of any cardiac type chest pain, palpitations, dyspnea Vertell Limber /PND, dizziness, claudication or dependent edema.        Hyperlipidemia is controlled with diet & meds. Patient denies myalgias or other med SE's. Last Lipids were at goal :  Lab Results  Component Value Date   CHOL 159 09/16/2022   HDL 61 09/16/2022   LDLCALC 81 09/16/2022   TRIG 88 09/16/2022   CHOLHDL 2.6 09/16/2022     Also, the patient has history of PreDiabetes and has had no symptoms of reactive hypoglycemia, diabetic polys, paresthesias or visual blurring.  Last A1c was at goal :  Lab Results  Component Value Date   HGBA1C 5.4 09/16/2022                                                          Further, the patient also has history of Vitamin D Deficiency and supplements vitamin D without any suspected side-effects. Last vitamin D was near goal :  Lab Results  Component Value Date   VD25OH 57 09/16/2022       Current Outpatient Medications  Medication Instructions   atorvastatin 80 MG tablet Take  1 tablet  Daily    PEPTO BISMOL 30 mLs,  Every 6 hours PRN   buPROPion  XL  150 MG  TAKE 1 TABLET EVERY MORNING   VITAMIN D 5000 u 1 tablet  Daily   VITAMIN K2 - MK-7   Oral   Multiple Vitamin  3,000 Units Life Extensions   CREON  24,000-76,000 units CPEP One with meals and one with snacks     No Active Allergies    Past Medical History:  Diagnosis Date   Alcohol abuse    Anxiety    Depression    Hyperlipidemia    Lymphocytic colitis    Malaria    as a child   Memory loss      Immunization History  Administered Date(s) Administered   Influenza, High Dose   09/28/2021   Moderna Covid-19 Vacc   11/10/2021   PFIZER SARS-COV-2 Vacc  02/19/2020, 03/11/2020, 11/26/2020   Pneumococcal  - 13 12/18/2019   Pneumococcal  - 23 08/03/2021   Td 04/26/2020     Past Surgical History:  Procedure Laterality Date  [place]   Aortic valve   CARDIAC VALVE SURGERY  2016   Aortic valve    FHx:    Reviewed / unchanged   SHx:    Reviewed / unchanged    Systems Review:  Constitutional: Denies fever, chills, wt changes, headaches, insomnia, fatigue, night sweats, change in appetite. Eyes: Denies redness, blurred vision, diplopia, discharge, itchy, watery eyes.  ENT: Denies discharge, congestion, post nasal drip, epistaxis, sore throat, earache, hearing loss, dental pain, tinnitus, vertigo, sinus pain, snoring.  CV: Denies chest pain, palpitations, irregular heartbeat, syncope, dyspnea, diaphoresis, orthopnea, PND, claudication or edema. Respiratory: denies cough, dyspnea, DOE, pleurisy, hoarseness, laryngitis, wheezing.  Gastrointestinal: Denies dysphagia, odynophagia, heartburn, reflux, water brash, abdominal pain or cramps, nausea, vomiting, bloating, diarrhea, constipation, hematemesis, melena, hematochezia  or hemorrhoids. Genitourinary: Denies dysuria, frequency, urgency, nocturia, hesitancy, discharge, hematuria or flank pain. Musculoskeletal: Denies arthralgias, myalgias, stiffness, jt. swelling, pain, limping or strain/sprain.  Skin: Denies pruritus, rash, hives, warts, acne, eczema or change in skin lesion(s). Neuro: No weakness, tremor, incoordination, spasms, paresthesia or  pain. Psychiatric: Denies confusion, memory loss or sensory loss. Endo: Denies change in weight, skin or hair change.  Heme/Lymph: No excessive bleeding, bruising or enlarged lymph nodes.  Physical Exam  There were no vitals taken for this visit.  Appears  well nourished, well groomed  and in no distress.  Eyes: PERRLA, EOMs, conjunctiva no swelling or erythema. Sinuses: No frontal/maxillary tenderness ENT/Mouth: EAC's clear, TM's nl w/o erythema, bulging. Nares clear w/o erythema, swelling, exudates. Oropharynx clear without erythema or exudates. Oral hygiene is good. Tongue normal, non obstructing. Hearing intact.  Neck: Supple. Thyroid not palpable. Car 2+/2+ without bruits, nodes or JVD. Chest: Respirations nl with BS clear & equal w/o rales, rhonchi, wheezing or stridor.  Cor: Heart sounds normal w/ regular rate and rhythm without sig. murmurs, gallops, clicks or rubs. Peripheral pulses normal and equal  without edema.  Abdomen: Soft & bowel sounds normal. Non-tender w/o guarding, rebound, hernias, masses or organomegaly.  Lymphatics: Unremarkable.  Musculoskeletal: Full ROM all peripheral extremities, joint stability, 5/5 strength and normal gait.  Skin: Warm, dry without exposed rashes, lesions or ecchymosis apparent.  Neuro: Cranial nerves intact, reflexes equal bilaterally. Sensory-motor testing grossly intact. Tendon reflexes grossly intact.  Pysch: Alert & oriented x 3.  Insight and judgement nl & appropriate. No ideations.  Assessment and Plan:  1. Elevated BP without diagnosis of hypertension  - CBC with Differential/Platelet - COMPLETE METABOLIC PANEL WITH GFR - Magnesium - TSH  2. Hyperlipidemia, mixed  - Lipid panel - TSH  3. Abnormal glucose  - Hemoglobin A1c - Insulin, random  4. Vitamin D deficiency  - VITAMIN D 25 Hydroxy   5. Aortic atherosclerosis ([place]) - CT 05/2020  - Lipid panel  6. Moderate vascular dementia  ([facility])  - TSH - Vitamin  B12  7. B12 deficiency  - Vitamin B12  8. Medication management  - CBC with Differential/Platelet - COMPLETE METABOLIC PANEL WITH GFR - Magnesium - Lipid panel - TSH - Hemoglobin A1c - Insulin, random - VITAMIN D 25 Hydroxy - Vitamin B12        Discussed  regular exercise, BP monitoring, weight control to achieve/maintain BMI less than 25 and discussed med and SE's. Recommended labs to assess and monitor clinical status with further disposition pending results of labs.  I discussed the assessment and treatment plan with the patient. The patient was provided an opportunity to ask questions and all  were answered. The patient agreed with the plan and demonstrated an understanding of the instructions.  I provided over 30 minutes of exam, counseling, chart review and  complex critical decision making.      The patient was advised to call back or seek an in-person evaluation if the symptoms worsen or if the condition fails to improve as anticipated.       Patient was strongly discouraged from drinking alcohol, but she didn't seem to recall that for 60 seconds ! Patient also given written instructions not to drink alcohol.    Kirtland Bouchard, MD

## 2022-12-27 NOTE — Patient Instructions (Signed)

## 2022-12-28 ENCOUNTER — Other Ambulatory Visit: Payer: Self-pay

## 2022-12-28 ENCOUNTER — Encounter: Payer: Self-pay | Admitting: Internal Medicine

## 2022-12-28 ENCOUNTER — Ambulatory Visit (INDEPENDENT_AMBULATORY_CARE_PROVIDER_SITE_OTHER): Payer: Medicare Other | Admitting: Internal Medicine

## 2022-12-28 VITALS — BP 120/68 | HR 101 | Temp 97.9°F | Resp 18 | Ht 64.0 in | Wt 105.6 lb

## 2022-12-28 DIAGNOSIS — I700 Atherosclerosis of aorta: Secondary | ICD-10-CM

## 2022-12-28 DIAGNOSIS — F01B0 Vascular dementia, moderate, without behavioral disturbance, psychotic disturbance, mood disturbance, and anxiety: Secondary | ICD-10-CM | POA: Diagnosis not present

## 2022-12-28 DIAGNOSIS — R7309 Other abnormal glucose: Secondary | ICD-10-CM

## 2022-12-28 DIAGNOSIS — J042 Acute laryngotracheitis: Secondary | ICD-10-CM | POA: Diagnosis not present

## 2022-12-28 DIAGNOSIS — Z79899 Other long term (current) drug therapy: Secondary | ICD-10-CM | POA: Diagnosis not present

## 2022-12-28 DIAGNOSIS — R4789 Other speech disturbances: Secondary | ICD-10-CM | POA: Diagnosis not present

## 2022-12-28 DIAGNOSIS — E538 Deficiency of other specified B group vitamins: Secondary | ICD-10-CM

## 2022-12-28 DIAGNOSIS — R488 Other symbolic dysfunctions: Secondary | ICD-10-CM | POA: Diagnosis not present

## 2022-12-28 DIAGNOSIS — E782 Mixed hyperlipidemia: Secondary | ICD-10-CM

## 2022-12-28 DIAGNOSIS — M6281 Muscle weakness (generalized): Secondary | ICD-10-CM | POA: Diagnosis not present

## 2022-12-28 DIAGNOSIS — R2689 Other abnormalities of gait and mobility: Secondary | ICD-10-CM | POA: Diagnosis not present

## 2022-12-28 DIAGNOSIS — E559 Vitamin D deficiency, unspecified: Secondary | ICD-10-CM

## 2022-12-28 DIAGNOSIS — R030 Elevated blood-pressure reading, without diagnosis of hypertension: Secondary | ICD-10-CM | POA: Diagnosis not present

## 2022-12-28 DIAGNOSIS — Z1152 Encounter for screening for COVID-19: Secondary | ICD-10-CM

## 2022-12-28 DIAGNOSIS — G4483 Primary cough headache: Secondary | ICD-10-CM

## 2022-12-28 LAB — POC INFLUENZA A&B (BINAX/QUICKVUE)
Influenza A, POC: NEGATIVE
Influenza B, POC: NEGATIVE

## 2022-12-28 LAB — POC COVID19 BINAXNOW: SARS Coronavirus 2 Ag: NEGATIVE

## 2022-12-28 MED ORDER — DEXAMETHASONE 2 MG PO TABS: ORAL_TABLET | ORAL | 0 refills | Status: DC

## 2022-12-28 MED ORDER — BENZONATATE 100 MG PO CAPS: ORAL_CAPSULE | ORAL | 1 refills | Status: DC

## 2022-12-29 LAB — COMPLETE METABOLIC PANEL WITH GFR
AG Ratio: 1.5 (calc) (ref 1.0–2.5)
ALT: 21 U/L (ref 6–29)
AST: 24 U/L (ref 10–35)
Albumin: 3.8 g/dL (ref 3.6–5.1)
Alkaline phosphatase (APISO): 77 U/L (ref 37–153)
BUN: 22 mg/dL (ref 7–25)
CO2: 26 mmol/L (ref 20–32)
Calcium: 9.2 mg/dL (ref 8.6–10.4)
Chloride: 102 mmol/L (ref 98–110)
Creat: 0.8 mg/dL (ref 0.60–0.95)
Globulin: 2.6 g/dL (calc) (ref 1.9–3.7)
Glucose, Bld: 106 mg/dL — ABNORMAL HIGH (ref 65–99)
Potassium: 4.6 mmol/L (ref 3.5–5.3)
Sodium: 139 mmol/L (ref 135–146)
Total Bilirubin: 0.9 mg/dL (ref 0.2–1.2)
Total Protein: 6.4 g/dL (ref 6.1–8.1)
eGFR: 74 mL/min/{1.73_m2} (ref >=60)

## 2022-12-29 LAB — CBC WITH DIFFERENTIAL/PLATELET
Absolute Monocytes: 680 cells/uL (ref 200–950)
Basophils Absolute: 41 cells/uL (ref 0–200)
Basophils Relative: 0.5 %
Eosinophils Absolute: 49 cells/uL (ref 15–500)
Eosinophils Relative: 0.6 %
HCT: 35.1 % (ref 35.0–45.0)
Hemoglobin: 11.8 g/dL (ref 11.7–15.5)
Lymphs Abs: 980 cells/uL (ref 850–3900)
MCH: 30.8 pg (ref 27.0–33.0)
MCHC: 33.6 g/dL (ref 32.0–36.0)
MCV: 91.6 fL (ref 80.0–100.0)
MPV: 12.4 fL (ref 7.5–12.5)
Monocytes Relative: 8.4 %
Neutro Abs: 6350 cells/uL (ref 1500–7800)
Neutrophils Relative %: 78.4 %
Platelets: 197 10*3/uL (ref 140–400)
RBC: 3.83 10*6/uL (ref 3.80–5.10)
RDW: 12.4 % (ref 11.0–15.0)
Total Lymphocyte: 12.1 %
WBC: 8.1 10*3/uL (ref 3.8–10.8)

## 2022-12-29 LAB — INSULIN, RANDOM: Insulin: 13.8 u[IU]/mL

## 2022-12-29 LAB — TSH: TSH: 1.22 mIU/L (ref 0.40–4.50)

## 2022-12-29 LAB — LIPID PANEL
Cholesterol: 163 mg/dL (ref <200)
HDL: 61 mg/dL (ref >=50)
LDL Cholesterol (Calc): 85 mg/dL (calc)
Non-HDL Cholesterol (Calc): 102 mg/dL (calc) (ref <130)
Total CHOL/HDL Ratio: 2.7 (calc) (ref <5.0)
Triglycerides: 79 mg/dL (ref <150)

## 2022-12-29 LAB — VITAMIN D 25 HYDROXY (VIT D DEFICIENCY, FRACTURES): Vit D, 25-Hydroxy: 55 ng/mL (ref 30–100)

## 2022-12-29 LAB — VITAMIN B12: Vitamin B-12: 1089 pg/mL (ref 200–1100)

## 2022-12-29 LAB — HEMOGLOBIN A1C
Hgb A1c MFr Bld: 5.4 % of total Hgb (ref <5.7)
Mean Plasma Glucose: 108 mg/dL
eAG (mmol/L): 6 mmol/L

## 2022-12-29 LAB — MAGNESIUM: Magnesium: 1.9 mg/dL (ref 1.5–2.5)

## 2022-12-29 NOTE — Progress Notes (Signed)
<><><><><><><><><><><><><><><><><><><><><><><><><><><><><><><><><> <><><><><><><><><><><><><><><><><><><><><><><><><><><><><><><><><> -   Test results slightly outside the reference range are not unusual.  If there is anything important, I will review this with you,  otherwise it is considered normal test values.  If you have further questions,  please do not hesitate to contact me at the office or via My Chart.  <><><><><><><><><><><><><><><><><><><><><><><><><><><><><><><><><> <><><><><><><><><><><><><><><><><><><><><><><><><><><><><><><><><>  -  Thyroid - Normal  <><><><><><><><><><><><><><><><><><><><><><><><><><><><><><><><><>  A1c - Normal - No Diabetes  - Great  ! <><><><><><><><><><><><><><><><><><><><><><><><><><><><><><><><><>  - Vitamin D = 55  - is a little low, but OK  <><><><><><><><><><><><><><><><><><><><><><><><><><><><><><><><><>  - Vitamin B12 - is at  a Great level   <><><><><><><><><><><><><><><><><><><><><><><><><><><><><><><><><>  Total Chol = 163   &   LDL = 85   - both  Excellent   - Very low risk for Heart Attack  / Stroke <><><><><><><><><><><><><><><><><><><><><><><><><><><><><><><><><>  - All Else - CBC - Kidneys - Electrolytes - Liver - Magnesium & Thyroid    - all  Normal / OK <><><><><><><><><><><><><><><><><><><><><><><><><><><><><><><><><> <><><><><><><><><><><><><><><><><><><><><><><><><><><><><><><><><>

## 2022-12-30 DIAGNOSIS — R4789 Other speech disturbances: Secondary | ICD-10-CM | POA: Diagnosis not present

## 2022-12-30 DIAGNOSIS — R488 Other symbolic dysfunctions: Secondary | ICD-10-CM | POA: Diagnosis not present

## 2022-12-30 DIAGNOSIS — R2689 Other abnormalities of gait and mobility: Secondary | ICD-10-CM | POA: Diagnosis not present

## 2022-12-30 DIAGNOSIS — M6281 Muscle weakness (generalized): Secondary | ICD-10-CM | POA: Diagnosis not present

## 2023-01-03 DIAGNOSIS — R4789 Other speech disturbances: Secondary | ICD-10-CM | POA: Diagnosis not present

## 2023-01-03 DIAGNOSIS — R488 Other symbolic dysfunctions: Secondary | ICD-10-CM | POA: Diagnosis not present

## 2023-01-03 DIAGNOSIS — R2689 Other abnormalities of gait and mobility: Secondary | ICD-10-CM | POA: Diagnosis not present

## 2023-01-03 DIAGNOSIS — M6281 Muscle weakness (generalized): Secondary | ICD-10-CM | POA: Diagnosis not present

## 2023-01-04 ENCOUNTER — Other Ambulatory Visit: Payer: Self-pay | Admitting: Internal Medicine

## 2023-01-04 DIAGNOSIS — R2689 Other abnormalities of gait and mobility: Secondary | ICD-10-CM | POA: Diagnosis not present

## 2023-01-04 DIAGNOSIS — R4789 Other speech disturbances: Secondary | ICD-10-CM | POA: Diagnosis not present

## 2023-01-04 DIAGNOSIS — M6281 Muscle weakness (generalized): Secondary | ICD-10-CM | POA: Diagnosis not present

## 2023-01-04 DIAGNOSIS — R488 Other symbolic dysfunctions: Secondary | ICD-10-CM | POA: Diagnosis not present

## 2023-01-04 DIAGNOSIS — E782 Mixed hyperlipidemia: Secondary | ICD-10-CM

## 2023-01-04 MED ORDER — ATORVASTATIN CALCIUM 80 MG PO TABS: ORAL_TABLET | ORAL | 3 refills | Status: DC

## 2023-01-05 DIAGNOSIS — R2689 Other abnormalities of gait and mobility: Secondary | ICD-10-CM | POA: Diagnosis not present

## 2023-01-05 DIAGNOSIS — M6281 Muscle weakness (generalized): Secondary | ICD-10-CM | POA: Diagnosis not present

## 2023-01-05 DIAGNOSIS — R488 Other symbolic dysfunctions: Secondary | ICD-10-CM | POA: Diagnosis not present

## 2023-01-05 DIAGNOSIS — R4789 Other speech disturbances: Secondary | ICD-10-CM | POA: Diagnosis not present

## 2023-01-06 DIAGNOSIS — M6281 Muscle weakness (generalized): Secondary | ICD-10-CM | POA: Diagnosis not present

## 2023-01-06 DIAGNOSIS — R4789 Other speech disturbances: Secondary | ICD-10-CM | POA: Diagnosis not present

## 2023-01-06 DIAGNOSIS — R2689 Other abnormalities of gait and mobility: Secondary | ICD-10-CM | POA: Diagnosis not present

## 2023-01-06 DIAGNOSIS — R488 Other symbolic dysfunctions: Secondary | ICD-10-CM | POA: Diagnosis not present

## 2023-01-10 DIAGNOSIS — R2689 Other abnormalities of gait and mobility: Secondary | ICD-10-CM | POA: Diagnosis not present

## 2023-01-10 DIAGNOSIS — R488 Other symbolic dysfunctions: Secondary | ICD-10-CM | POA: Diagnosis not present

## 2023-01-10 DIAGNOSIS — R4789 Other speech disturbances: Secondary | ICD-10-CM | POA: Diagnosis not present

## 2023-01-10 DIAGNOSIS — M6281 Muscle weakness (generalized): Secondary | ICD-10-CM | POA: Diagnosis not present

## 2023-01-12 DIAGNOSIS — R2689 Other abnormalities of gait and mobility: Secondary | ICD-10-CM | POA: Diagnosis not present

## 2023-01-12 DIAGNOSIS — M6281 Muscle weakness (generalized): Secondary | ICD-10-CM | POA: Diagnosis not present

## 2023-01-12 DIAGNOSIS — R488 Other symbolic dysfunctions: Secondary | ICD-10-CM | POA: Diagnosis not present

## 2023-01-12 DIAGNOSIS — R4789 Other speech disturbances: Secondary | ICD-10-CM | POA: Diagnosis not present

## 2023-01-17 DIAGNOSIS — R488 Other symbolic dysfunctions: Secondary | ICD-10-CM | POA: Diagnosis not present

## 2023-01-17 DIAGNOSIS — R2689 Other abnormalities of gait and mobility: Secondary | ICD-10-CM | POA: Diagnosis not present

## 2023-01-17 DIAGNOSIS — R4789 Other speech disturbances: Secondary | ICD-10-CM | POA: Diagnosis not present

## 2023-01-17 DIAGNOSIS — M6281 Muscle weakness (generalized): Secondary | ICD-10-CM | POA: Diagnosis not present

## 2023-01-19 DIAGNOSIS — M6281 Muscle weakness (generalized): Secondary | ICD-10-CM | POA: Diagnosis not present

## 2023-01-19 DIAGNOSIS — R488 Other symbolic dysfunctions: Secondary | ICD-10-CM | POA: Diagnosis not present

## 2023-01-19 DIAGNOSIS — R2689 Other abnormalities of gait and mobility: Secondary | ICD-10-CM | POA: Diagnosis not present

## 2023-01-19 DIAGNOSIS — R4789 Other speech disturbances: Secondary | ICD-10-CM | POA: Diagnosis not present

## 2023-01-20 DIAGNOSIS — M6281 Muscle weakness (generalized): Secondary | ICD-10-CM | POA: Diagnosis not present

## 2023-01-20 DIAGNOSIS — R2689 Other abnormalities of gait and mobility: Secondary | ICD-10-CM | POA: Diagnosis not present

## 2023-01-20 DIAGNOSIS — R4789 Other speech disturbances: Secondary | ICD-10-CM | POA: Diagnosis not present

## 2023-01-20 DIAGNOSIS — R488 Other symbolic dysfunctions: Secondary | ICD-10-CM | POA: Diagnosis not present

## 2023-01-21 ENCOUNTER — Other Ambulatory Visit: Payer: Self-pay | Admitting: Nurse Practitioner

## 2023-01-21 DIAGNOSIS — F331 Major depressive disorder, recurrent, moderate: Secondary | ICD-10-CM

## 2023-01-24 DIAGNOSIS — R2689 Other abnormalities of gait and mobility: Secondary | ICD-10-CM | POA: Diagnosis not present

## 2023-01-24 DIAGNOSIS — R4789 Other speech disturbances: Secondary | ICD-10-CM | POA: Diagnosis not present

## 2023-01-24 DIAGNOSIS — M6281 Muscle weakness (generalized): Secondary | ICD-10-CM | POA: Diagnosis not present

## 2023-01-24 DIAGNOSIS — R488 Other symbolic dysfunctions: Secondary | ICD-10-CM | POA: Diagnosis not present

## 2023-01-25 ENCOUNTER — Ambulatory Visit (INDEPENDENT_AMBULATORY_CARE_PROVIDER_SITE_OTHER): Payer: Medicare Other | Admitting: Internal Medicine

## 2023-01-25 ENCOUNTER — Encounter: Payer: Self-pay | Admitting: Internal Medicine

## 2023-01-25 VITALS — BP 106/72 | HR 68 | Temp 97.9°F | Resp 16 | Ht 64.0 in | Wt 104.4 lb

## 2023-01-25 DIAGNOSIS — R634 Abnormal weight loss: Secondary | ICD-10-CM

## 2023-01-25 DIAGNOSIS — I951 Orthostatic hypotension: Secondary | ICD-10-CM

## 2023-01-25 DIAGNOSIS — R052 Subacute cough: Secondary | ICD-10-CM

## 2023-01-25 DIAGNOSIS — Z79899 Other long term (current) drug therapy: Secondary | ICD-10-CM | POA: Diagnosis not present

## 2023-01-25 MED ORDER — MIRTAZAPINE 15 MG PO TABS: ORAL_TABLET | ORAL | 3 refills | Status: DC

## 2023-01-25 NOTE — Patient Instructions (Signed)

## 2023-01-25 NOTE — Progress Notes (Signed)
Future Appointments  Date Time Provider Department  01/25/2023 11:30 AM Unk Pinto, MD [facility]  04/27/2023 11:30 AM Darrol Jump, NP [facility]  09/20/2023  3:00 PM Darrol Jump, NP [facility]    History of Present Illness:       This very nice 82 y.o. [place] with hx/o elevated BP,  HLD, Pre-Diabetes and Vitamin D Deficiency & Dementia residing at  [facility] is brought in by her daughter for concern of "strangling easily".  Patient was treated  4 weeks ago for Acute viral laryngotracheitis with a Decadron taper . Patient expresses occasional dry tickle type non productive cough.        Current Outpatient Medications  Medication Instructions   atorvastatin 80 MG tablet Take  1 tablet  Daily    PEPTO BISMOL 30 mLs,  Every 6 hours PRN   buPROPion  XL  150 MG  TAKE 1 TABLET EVERY MORNING   VITAMIN D 5000 u 1 tablet  Daily   VITAMIN K2 - MK-7   Oral   Multiple Vitamin  3,000 Units Life Extensions   CREON 24,000-76,000 units CPEP One with meals and one with snacks    No Active Allergies   Past Medical History:  Diagnosis Date   Alcohol abuse    Anxiety    Depression    Hyperlipidemia    Lymphocytic colitis    Malaria    as a child   Memory loss      Immunization History  Administered Date(s) Administered   Influenza, High Dose   09/28/2021   Moderna Covid-19 Vacc   11/10/2021   PFIZER SARS-COV-2 Vacc  02/19/2020, 03/11/2020, 11/26/2020   Pneumococcal  - 13 12/18/2019   Pneumococcal  - 23 08/03/2021   Td 04/26/2020     Past Surgical History:  Procedure Laterality Date   [place]   Aortic valve   CARDIAC VALVE SURGERY  2016   Aortic valve    FHx:    Reviewed / unchanged   SHx:    Reviewed / unchanged    Systems Review:  Constitutional: Denies fever, chills, wt changes, headaches, insomnia, fatigue, night sweats, change in appetite. Eyes: Denies redness, blurred vision, diplopia, discharge, itchy,  watery eyes.  ENT: Denies discharge, congestion, post nasal drip, epistaxis, sore throat, earache, hearing loss, dental pain, tinnitus, vertigo, sinus pain, snoring.  CV: Denies chest pain, palpitations, irregular heartbeat, syncope, dyspnea, diaphoresis, orthopnea, PND, claudication or edema. Respiratory: denies cough, dyspnea, DOE, pleurisy, hoarseness, laryngitis, wheezing.  Gastrointestinal: Denies dysphagia, odynophagia, heartburn, reflux, water brash, abdominal pain or cramps, nausea, vomiting, bloating, diarrhea, constipation, hematemesis, melena, hematochezia  or hemorrhoids. Genitourinary: Denies dysuria, frequency, urgency, nocturia, hesitancy, discharge, hematuria or flank pain. Musculoskeletal: Denies arthralgias, myalgias, stiffness, jt. swelling, pain, limping or strain/sprain.  Skin: Denies pruritus, rash, hives, warts, acne, eczema or change in skin lesion(s). Neuro: No weakness, tremor, incoordination, spasms, paresthesia or pain. Psychiatric: Denies confusion, memory loss or sensory loss. Endo: Denies change in weight, skin or hair change.  Heme/Lymph: No excessive bleeding, bruising or enlarged lymph nodes.  Physical Exam  BP 106/72   Pulse 68   Temp 97.9 F (36.6 C)   Resp 16   Ht '5\' 4"'$  (1.626 m)   Wt 104 lb 6.4 oz (47.4 kg)   SpO2 99%   BMI 17.92 kg/m   Postural       Sitting    BP 106/72    P 72          &  Standing   BP 97/59     P 87    Appears  very thin with generalized decrease in muscle mass , power, tone & bulk.  Well groomed  and in no distress.  No cough, stridor,] &  speech fluent.   Eyes: PERRLA, EOMs, conjunctiva no swelling or erythema. Sinuses: No frontal/maxillary tenderness ENT/Mouth: EAC's clear, TM's nl w/o erythema, bulging. Nares clear w/o erythema, swelling, exudates. Oropharynx clear without erythema or exudates. Oral hygiene is good. Tongue normal, non obstructing. Hearing intact.  Neck: Supple. Thyroid not palpable. Car 2+/2+  without bruits, nodes or JVD. Chest: Respirations nl with BS clear & equal w/few scattered  rales,  but no rhonchi, wheezing or stridor.  Cor: Heart sounds normal w/ regular rate and rhythm without sig. murmurs, gallops, clicks or rubs. Peripheral pulses normal and equal  without edema.  Abdomen: Soft & bowel sounds normal. Non-tender w/o guarding, rebound, hernias, masses or organomegaly.  Lymphatics: Unremarkable.  Musculoskeletal: Full ROM all peripheral extremities, joint stability, 5/5 strength and normal gait.  Skin: Warm, dry without exposed rashes, lesions or ecchymosis apparent.  Neuro: Cranial nerves intact, reflexes equal bilaterally. Sensory-motor testing grossly intact. Tendon reflexes grossly intact.  Pysch: Alert & oriented x 3.  Insight and judgement nl & appropriate. No ideations.  Assessment and Plan:   1. Subacute cough   2. Weight loss  - mirtazapine (REMERON) 15 MG tablet;  Take 1 tablet at Bedtime for appetite & Sleep   Dispense: 90 tablet; Refill: 3  3. Postural hypotension  - Strongly encouraged to increase fluid intake.   4. Medication management  - Ascorbic Acid (VITAMIN C PO); PRN - Multiple Vitamins-Minerals (ZINC PO); Take by mouth. 2 days a week        I discussed the assessment and treatment plan with the patient. The patient was provided an opportunity to ask questions and all were answered. The patient agreed with the plan and demonstrated an understanding of the instructions.       Patient was strongly discouraged from drinking alcohol, but she didn't seem to recall that for 60 seconds ! Patient also given written instructions not to drink alcohol.    Kirtland Bouchard, MD

## 2023-01-26 DIAGNOSIS — R488 Other symbolic dysfunctions: Secondary | ICD-10-CM | POA: Diagnosis not present

## 2023-01-26 DIAGNOSIS — M6281 Muscle weakness (generalized): Secondary | ICD-10-CM | POA: Diagnosis not present

## 2023-01-26 DIAGNOSIS — R4789 Other speech disturbances: Secondary | ICD-10-CM | POA: Diagnosis not present

## 2023-01-26 DIAGNOSIS — R2689 Other abnormalities of gait and mobility: Secondary | ICD-10-CM | POA: Diagnosis not present

## 2023-01-27 DIAGNOSIS — R488 Other symbolic dysfunctions: Secondary | ICD-10-CM | POA: Diagnosis not present

## 2023-01-27 DIAGNOSIS — R4789 Other speech disturbances: Secondary | ICD-10-CM | POA: Diagnosis not present

## 2023-01-27 DIAGNOSIS — R2689 Other abnormalities of gait and mobility: Secondary | ICD-10-CM | POA: Diagnosis not present

## 2023-01-27 DIAGNOSIS — M6281 Muscle weakness (generalized): Secondary | ICD-10-CM | POA: Diagnosis not present

## 2023-01-31 DIAGNOSIS — R4789 Other speech disturbances: Secondary | ICD-10-CM | POA: Diagnosis not present

## 2023-01-31 DIAGNOSIS — R488 Other symbolic dysfunctions: Secondary | ICD-10-CM | POA: Diagnosis not present

## 2023-01-31 DIAGNOSIS — M6281 Muscle weakness (generalized): Secondary | ICD-10-CM | POA: Diagnosis not present

## 2023-01-31 DIAGNOSIS — R2689 Other abnormalities of gait and mobility: Secondary | ICD-10-CM | POA: Diagnosis not present

## 2023-02-03 DIAGNOSIS — R488 Other symbolic dysfunctions: Secondary | ICD-10-CM | POA: Diagnosis not present

## 2023-02-03 DIAGNOSIS — R4789 Other speech disturbances: Secondary | ICD-10-CM | POA: Diagnosis not present

## 2023-02-03 DIAGNOSIS — M6281 Muscle weakness (generalized): Secondary | ICD-10-CM | POA: Diagnosis not present

## 2023-02-03 DIAGNOSIS — R2689 Other abnormalities of gait and mobility: Secondary | ICD-10-CM | POA: Diagnosis not present

## 2023-02-04 DIAGNOSIS — R2689 Other abnormalities of gait and mobility: Secondary | ICD-10-CM | POA: Diagnosis not present

## 2023-02-04 DIAGNOSIS — R488 Other symbolic dysfunctions: Secondary | ICD-10-CM | POA: Diagnosis not present

## 2023-02-04 DIAGNOSIS — M6281 Muscle weakness (generalized): Secondary | ICD-10-CM | POA: Diagnosis not present

## 2023-02-04 DIAGNOSIS — R4789 Other speech disturbances: Secondary | ICD-10-CM | POA: Diagnosis not present

## 2023-02-09 DIAGNOSIS — R488 Other symbolic dysfunctions: Secondary | ICD-10-CM | POA: Diagnosis not present

## 2023-02-09 DIAGNOSIS — R2689 Other abnormalities of gait and mobility: Secondary | ICD-10-CM | POA: Diagnosis not present

## 2023-02-09 DIAGNOSIS — M6281 Muscle weakness (generalized): Secondary | ICD-10-CM | POA: Diagnosis not present

## 2023-02-09 DIAGNOSIS — R4789 Other speech disturbances: Secondary | ICD-10-CM | POA: Diagnosis not present

## 2023-02-10 DIAGNOSIS — R2689 Other abnormalities of gait and mobility: Secondary | ICD-10-CM | POA: Diagnosis not present

## 2023-02-10 DIAGNOSIS — R488 Other symbolic dysfunctions: Secondary | ICD-10-CM | POA: Diagnosis not present

## 2023-02-10 DIAGNOSIS — M6281 Muscle weakness (generalized): Secondary | ICD-10-CM | POA: Diagnosis not present

## 2023-02-10 DIAGNOSIS — R4789 Other speech disturbances: Secondary | ICD-10-CM | POA: Diagnosis not present

## 2023-02-14 DIAGNOSIS — R4789 Other speech disturbances: Secondary | ICD-10-CM | POA: Diagnosis not present

## 2023-02-14 DIAGNOSIS — R2689 Other abnormalities of gait and mobility: Secondary | ICD-10-CM | POA: Diagnosis not present

## 2023-02-14 DIAGNOSIS — R488 Other symbolic dysfunctions: Secondary | ICD-10-CM | POA: Diagnosis not present

## 2023-02-14 DIAGNOSIS — M6281 Muscle weakness (generalized): Secondary | ICD-10-CM | POA: Diagnosis not present

## 2023-02-16 DIAGNOSIS — M6281 Muscle weakness (generalized): Secondary | ICD-10-CM | POA: Diagnosis not present

## 2023-02-16 DIAGNOSIS — R2689 Other abnormalities of gait and mobility: Secondary | ICD-10-CM | POA: Diagnosis not present

## 2023-02-16 DIAGNOSIS — R488 Other symbolic dysfunctions: Secondary | ICD-10-CM | POA: Diagnosis not present

## 2023-02-16 DIAGNOSIS — R4789 Other speech disturbances: Secondary | ICD-10-CM | POA: Diagnosis not present

## 2023-02-18 DIAGNOSIS — R488 Other symbolic dysfunctions: Secondary | ICD-10-CM | POA: Diagnosis not present

## 2023-02-18 DIAGNOSIS — R4789 Other speech disturbances: Secondary | ICD-10-CM | POA: Diagnosis not present

## 2023-02-18 DIAGNOSIS — R2689 Other abnormalities of gait and mobility: Secondary | ICD-10-CM | POA: Diagnosis not present

## 2023-02-18 DIAGNOSIS — M6281 Muscle weakness (generalized): Secondary | ICD-10-CM | POA: Diagnosis not present

## 2023-02-21 DIAGNOSIS — R2689 Other abnormalities of gait and mobility: Secondary | ICD-10-CM | POA: Diagnosis not present

## 2023-02-21 DIAGNOSIS — R4789 Other speech disturbances: Secondary | ICD-10-CM | POA: Diagnosis not present

## 2023-02-21 DIAGNOSIS — M6281 Muscle weakness (generalized): Secondary | ICD-10-CM | POA: Diagnosis not present

## 2023-02-21 DIAGNOSIS — R488 Other symbolic dysfunctions: Secondary | ICD-10-CM | POA: Diagnosis not present

## 2023-02-23 DIAGNOSIS — R4789 Other speech disturbances: Secondary | ICD-10-CM | POA: Diagnosis not present

## 2023-02-23 DIAGNOSIS — M6281 Muscle weakness (generalized): Secondary | ICD-10-CM | POA: Diagnosis not present

## 2023-02-23 DIAGNOSIS — R488 Other symbolic dysfunctions: Secondary | ICD-10-CM | POA: Diagnosis not present

## 2023-02-23 DIAGNOSIS — R2689 Other abnormalities of gait and mobility: Secondary | ICD-10-CM | POA: Diagnosis not present

## 2023-02-24 DIAGNOSIS — R488 Other symbolic dysfunctions: Secondary | ICD-10-CM | POA: Diagnosis not present

## 2023-02-24 DIAGNOSIS — R4789 Other speech disturbances: Secondary | ICD-10-CM | POA: Diagnosis not present

## 2023-02-24 DIAGNOSIS — M6281 Muscle weakness (generalized): Secondary | ICD-10-CM | POA: Diagnosis not present

## 2023-02-24 DIAGNOSIS — R2689 Other abnormalities of gait and mobility: Secondary | ICD-10-CM | POA: Diagnosis not present

## 2023-03-02 DIAGNOSIS — R4789 Other speech disturbances: Secondary | ICD-10-CM | POA: Diagnosis not present

## 2023-03-02 DIAGNOSIS — R488 Other symbolic dysfunctions: Secondary | ICD-10-CM | POA: Diagnosis not present

## 2023-03-03 DIAGNOSIS — R488 Other symbolic dysfunctions: Secondary | ICD-10-CM | POA: Diagnosis not present

## 2023-03-03 DIAGNOSIS — R4789 Other speech disturbances: Secondary | ICD-10-CM | POA: Diagnosis not present

## 2023-03-09 DIAGNOSIS — R488 Other symbolic dysfunctions: Secondary | ICD-10-CM | POA: Diagnosis not present

## 2023-03-09 DIAGNOSIS — R4789 Other speech disturbances: Secondary | ICD-10-CM | POA: Diagnosis not present

## 2023-03-11 DIAGNOSIS — R488 Other symbolic dysfunctions: Secondary | ICD-10-CM | POA: Diagnosis not present

## 2023-03-11 DIAGNOSIS — R4789 Other speech disturbances: Secondary | ICD-10-CM | POA: Diagnosis not present

## 2023-03-24 DIAGNOSIS — R4789 Other speech disturbances: Secondary | ICD-10-CM | POA: Diagnosis not present

## 2023-03-24 DIAGNOSIS — R488 Other symbolic dysfunctions: Secondary | ICD-10-CM | POA: Diagnosis not present

## 2023-03-25 ENCOUNTER — Other Ambulatory Visit: Payer: Self-pay | Admitting: Nurse Practitioner

## 2023-03-25 DIAGNOSIS — F331 Major depressive disorder, recurrent, moderate: Secondary | ICD-10-CM

## 2023-04-04 DIAGNOSIS — R4789 Other speech disturbances: Secondary | ICD-10-CM | POA: Diagnosis not present

## 2023-04-04 DIAGNOSIS — R488 Other symbolic dysfunctions: Secondary | ICD-10-CM | POA: Diagnosis not present

## 2023-04-13 DIAGNOSIS — R488 Other symbolic dysfunctions: Secondary | ICD-10-CM | POA: Diagnosis not present

## 2023-04-13 DIAGNOSIS — R4789 Other speech disturbances: Secondary | ICD-10-CM | POA: Diagnosis not present

## 2023-04-15 DIAGNOSIS — R488 Other symbolic dysfunctions: Secondary | ICD-10-CM | POA: Diagnosis not present

## 2023-04-15 DIAGNOSIS — R4789 Other speech disturbances: Secondary | ICD-10-CM | POA: Diagnosis not present

## 2023-04-16 ENCOUNTER — Other Ambulatory Visit: Payer: Self-pay | Admitting: Gastroenterology

## 2023-04-16 DIAGNOSIS — K52832 Lymphocytic colitis: Secondary | ICD-10-CM

## 2023-04-16 DIAGNOSIS — R197 Diarrhea, unspecified: Secondary | ICD-10-CM

## 2023-04-27 ENCOUNTER — Ambulatory Visit (INDEPENDENT_AMBULATORY_CARE_PROVIDER_SITE_OTHER): Payer: Medicare Other | Admitting: Nurse Practitioner

## 2023-04-27 ENCOUNTER — Other Ambulatory Visit: Payer: Self-pay | Admitting: Nurse Practitioner

## 2023-04-27 ENCOUNTER — Encounter: Payer: Self-pay | Admitting: Nurse Practitioner

## 2023-04-27 VITALS — BP 122/90 | HR 81 | Temp 97.8°F | Wt 108.6 lb

## 2023-04-27 DIAGNOSIS — Z952 Presence of prosthetic heart valve: Secondary | ICD-10-CM

## 2023-04-27 DIAGNOSIS — F1019 Alcohol abuse with unspecified alcohol-induced disorder: Secondary | ICD-10-CM | POA: Diagnosis not present

## 2023-04-27 DIAGNOSIS — M8580 Other specified disorders of bone density and structure, unspecified site: Secondary | ICD-10-CM

## 2023-04-27 DIAGNOSIS — J439 Emphysema, unspecified: Secondary | ICD-10-CM

## 2023-04-27 DIAGNOSIS — R6889 Other general symptoms and signs: Secondary | ICD-10-CM

## 2023-04-27 DIAGNOSIS — F3342 Major depressive disorder, recurrent, in full remission: Secondary | ICD-10-CM | POA: Diagnosis not present

## 2023-04-27 DIAGNOSIS — Z0001 Encounter for general adult medical examination with abnormal findings: Secondary | ICD-10-CM | POA: Diagnosis not present

## 2023-04-27 DIAGNOSIS — Z0000 Encounter for general adult medical examination without abnormal findings: Secondary | ICD-10-CM

## 2023-04-27 DIAGNOSIS — H35039 Hypertensive retinopathy, unspecified eye: Secondary | ICD-10-CM

## 2023-04-27 DIAGNOSIS — E782 Mixed hyperlipidemia: Secondary | ICD-10-CM | POA: Diagnosis not present

## 2023-04-27 DIAGNOSIS — Z79899 Other long term (current) drug therapy: Secondary | ICD-10-CM

## 2023-04-27 DIAGNOSIS — E559 Vitamin D deficiency, unspecified: Secondary | ICD-10-CM

## 2023-04-27 DIAGNOSIS — D692 Other nonthrombocytopenic purpura: Secondary | ICD-10-CM

## 2023-04-27 DIAGNOSIS — F331 Major depressive disorder, recurrent, moderate: Secondary | ICD-10-CM

## 2023-04-27 DIAGNOSIS — I700 Atherosclerosis of aorta: Secondary | ICD-10-CM

## 2023-04-27 MED ORDER — CREON 24000-76000 UNITS PO CPEP: ORAL_CAPSULE | ORAL | 3 refills | Status: AC

## 2023-04-27 NOTE — Progress Notes (Signed)
MEDICARE WELLNESS  Assessment and Plan:  Encounter for Medicare annual wellness exam Due Annually Health maintenance reviewed  Aortic atherosclerosis (HCC) - CT 05/2020 Control blood pressure, cholesterol, glucose, increase exercise.   Pulmonary emphysema, unspecified emphysema type (HCC) Per imaging, remote smoker, denies sx; monitor  Recurrent major depressive disorder, in full remission (HCC) Continue Wellbutrin; avoiding alcohol Lifestyle discussed: diet/exerise, sleep hygiene, stress management, hydration  Alcohol abuse with alcohol-induced disorder (HCC) Has reduced significantly currently avoiding continue to monitor closely Avoid sedating medications  Mixed hyperlipidemia Controlled Continue medications;  Discussed lifestyle modifications. Recommended diet heavy in fruits and veggies, omega 3's. Decrease consumption of animal meats, cheeses, and dairy products. Remain active and exercise as tolerated. Continue to monitor.  Senile purpura (HCC) Protect skin, sunscreen  H/O prosthetic aortic valve replacement Continue follow up cardio  Vitamin D deficiency Continue supplement  Medication management All medications discussed and reviewed in full. All questions and concerns regarding medications addressed.    Hypertensive retinopathy, unspecified laterality Followed by Dr. Lucretia Roers Keep BP well controlled. Continue to monitor.   Osteopenia Pursue a combination of weight-bearing exercises and strength training. Patients with severe mobility impairment should be referred for physical therapy. Advised on fall prevention measures including proper lighting in all rooms, removal of area rugs and floor clutter, use of walking devices as deemed appropriate, avoidance of uneven walking surfaces. Smoking cessation and moderate alcohol consumption if applicable Consume 800 to 1000 IU of vitamin D daily with a goal vitamin D serum value of 30 ng/mL or higher. Aim for 1000 to  1200 mg of elemental calcium daily through supplements and/or dietary sources.  No orders of the defined types were placed in this encounter.  Defer blood work today - stable 12/2022.  Will obtain NOV.   Notify office for further evaluation and treatment, questions or concerns if any reported s/s fail to improve.   The patient was advised to call back or seek an in-person evaluation if any symptoms worsen or if the condition fails to improve as anticipated.   Further disposition pending results of labs. Discussed med's effects and SE's.    I discussed the assessment and treatment plan with the patient. The patient was provided an opportunity to ask questions and all were answered. The patient agreed with the plan and demonstrated an understanding of the instructions.  Discussed med's effects and SE's. Screening labs and tests as requested with regular follow-up as recommended.  I provided 35 minutes of face-to-face time during this encounter including counseling, chart review, and critical decision making was preformed.  Today's Plan of Care is based on a patient-centered health care approach known as shared decision making - the decisions, tests and treatments allow for patient preferences and values to be balanced with clinical evidence.     Future Appointments  Date Time Provider Department Center  07/06/2022 10:00 AM Sundra Aland, RPH [facility] None  08/04/2022 11:10 AM Tressia Danas, MD [facility]  08/26/2022  3:00 PM Adela Glimpse, NP [facility] None  09/06/2022  1:30 PM Penumalli, Glenford Bayley, MD [facility] None  05/25/2023 11:30 AM Adela Glimpse, NP [facility] None     Plan:   During the course of the visit the patient was educated and counseled about appropriate screening and preventive services including:   Pneumococcal vaccine  Prevnar 13 Influenza vaccine Td vaccine Screening electrocardiogram Bone densitometry screening Colorectal cancer  screening Diabetes screening Glaucoma screening Nutrition counseling  Advanced directives: requested   HPI   82 y.o.  female  presents to clinic accompanied by daughter for medicare wellness visit and follow up for has Alcohol abuse with alcohol-induced disorder (HCC); Medication management; Mixed hyperlipidemia; H/O prosthetic aortic valve replacement; B12 deficiency; Vitamin D deficiency; Major depression in complete remission (HCC); Lung nodules; Lymphocytic colitis; Senile purpura (HCC); Aortic atherosclerosis (HCC) - CT 05/2020; Emphysema of lung (HCC) - CT 05/2020; Dementia (HCC); Thiamine deficiency; At high risk for falls; Osteopenia; DDD (degenerative disc disease), lumbar; and Lumbar back pain on their problem list.   Patient presents today accompanied by daughter.   She has been living at [facility] for the last year and is doing well.  She has not new concerns to report in clinic today.    She was seen in the ER on 04/29/22 for back pain.  She had also reported hitting her right ankle with her left ankle and her right ankle is was swollen and bruised.  She reported intermittent BLE swelling.  MRI was completed to show a compression fracture in the back are which was the cause of presenting back pain.  Edema was noted to be from dependent.  X-ray of right ankle was negative for break, only contusion.  Patient was also provided UA d/t confusion, which was normal.  She was treated with Tramadol and released.    Prior to her visit to the ED on 04/29/22, she was also seen on 04/27/22 again for back pain.  She had been treated in office with Gabapentin and Prednisone.  X-ray prior to ER visit noted significant lumbar disc disease, shifting of vertebra, arthritis and compression type changes.   She has history of anxiety, alcohol abuse, memory problems; her husband had kidney cancer and passed 2020. During that time she moved in with her daughter d/t memory concerns.  She was on numerous mood  meds without benefit, concern with trazodone/benzo with alcohol and was stopped, daughter is limiting alcohol recently to 1/day. Memory continues to be an issue, frequently asks the same questions.   She has seen Dr. Wendie Simmer, last visit 01/2020, normal MRI  Brain 04/2019. Last MMSE 29. 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 She has had low vitamin B levels in the past.  She has since avoid alcohol.  Her memory continues to be affected.  She was found to have lymphocytic colitis on colonoscopy in 06/2019, is on budesonide and is following with GI Dr. Orvan Falconer, well controlled.  Had CT 06/10/2020 showing stable 6 mm left lower lobe nodule, and has been asked to follow up with yearly imaging, but has not continued.  She also showed emphysematous changes and extensive aortic atherosclerosis. She is remote former smoker.   BMI is Body mass index is 18.64 kg/m., she has not been working on diet and exercise. Wt Readings from Last 3 Encounters:  04/27/23 108 lb 9.6 oz (49.3 kg)  01/25/23 104 lb 6.4 oz (47.4 kg)  12/28/22 105 lb 9.6 oz (47.9 kg)     Her blood pressure has been controlled at home, today their BP is BP: 122/90 She does not workout. She denies chest pain, shortness of breath, dizziness.   She has a history of hyperlipidemia on liptior and has history of bovine aortic valve replacement x 2, last echo was 05/2019, following with Dr. Royann Shivers  She is not on cholesterol medication and denies myalgias. Her cholesterol is at goal. The  cholesterol last visit was:   Lab Results  Component Value Date   CHOL 163 12/28/2022   HDL 61 12/28/2022   LDLCALC 85 12/28/2022   TRIG 79 12/28/2022   CHOLHDL 2.7 12/28/2022    Last A1C in the office was:  Lab Results  Component Value Date   HGBA1C 5.4 12/28/2022     Last GFR:  Lab  Results  Component Value Date   EGFR 74 12/28/2022   Patient is on Vitamin D supplement.   Lab Results  Last vitamin D Lab Results  Component Value Date   VD25OH 55 12/28/2022   Current Medications:   Current Outpatient Medications (Endocrine & Metabolic):    Budesonide ER 9 MG TB24, TAKE 1 TABLET BY MOUTH DAILY   predniSONE (DELTASONE) 20 MG tablet, 2 tablets daily for 3 days, 1 tablet daily for 4 days.  Current Outpatient Medications (Cardiovascular):    atorvastatin (LIPITOR) 80 MG tablet, Take  1 tablet  Daily for Cholesterol                              /                     TAKE 1 TABLET BY MOUTH   Current Outpatient Medications (Analgesics):    traMADol (ULTRAM) 50 MG tablet, Take 1 tablet (50 mg total) by mouth every 6 (six) hours as needed.   Current Outpatient Medications (Other):    bismuth subsalicylate (PEPTO BISMOL) 262 MG/15ML suspension, Take 30 mLs by mouth every 6 (six) hours as needed.   Cholecalciferol (VITAMIN D) 125 MCG (5000 UT) CAPS, Take 1 tablet by mouth daily.   Multiple Vitamin (MULTIVITAMIN ADULT PO), Take 3,000 Units by mouth. Life Extensions   Pancrelipase, Lip-Prot-Amyl, (CREON) 24000-76000 units CPEP, One with meals and one with snacks   gabapentin (NEURONTIN) 100 MG capsule, Take 1-3 capsules (100-300 mg total) by mouth 3 (three) times daily as needed. For back pain. (Patient not taking: Reported on 05/20/2022)  Allergies:  Allergies  Allergen Reactions   Tizanidine     Hypotension "felt like I had a stroke"    Medical History:  She has Alcohol abuse with alcohol-induced disorder (HCC); Medication management; Mixed hyperlipidemia; H/O prosthetic aortic valve replacement; B12 deficiency; Vitamin D deficiency; Major depression in complete remission (HCC); Lung nodules; Lymphocytic colitis; Senile purpura (HCC); Aortic atherosclerosis (HCC) - CT 05/2020; Emphysema of lung (HCC) - CT 05/2020; Dementia (HCC); Thiamine deficiency; At high risk for  falls; Osteopenia; DDD (degenerative disc disease), lumbar; and Lumbar back pain on their problem list.   Patient Care Team: Lucky Cowboy, MD as PCP - General (Internal Medicine) Croitoru, Rachelle Hora, MD as PCP - Cardiology (Cardiology) Marjory Lies, Glenford Bayley, MD as Consulting Physician (Neurology) Charlett Nose, [facility] as Pharmacist (Pharmacist)  Surgical History:  She has a past surgical history that includes Cardiac valuve replacement (1999) and Cardiac valuve replacement (2016). Family History:  Herfamily history includes Breast cancer in her mother; Hyperlipidemia in her mother; Hypertension in her mother; Kidney disease in her father; Parkinson's disease in her father; Parkinsonism in her father; Sleep apnea in her daughter. Social History:  She reports that she quit smoking about 43 years ago. Her smoking use included cigarettes. She has never used smokeless tobacco. She reports current alcohol use of about 28.0 standard drinks of alcohol per week. She reports that she does not use drugs.  Immunization History  Administered  Date(s) Administered   Influenza, High Dose Seasonal PF 09/28/2021   Moderna Covid-19 Vaccine Bivalent Booster 65yrs & up 11/10/2021   PFIZER(Purple Top)SARS-COV-2 Vaccination 02/19/2020, 03/11/2020, 11/26/2020   Pneumococcal Conjugate-13 12/18/2019   Pneumococcal Polysaccharide-23 08/03/2021   Td 04/26/2020   Tetanus: 04/2020 Pneumo 23: Today - patient left prior to receiving, plan with next OV Pneum 13: 11/2019 Influenza: 08/2022 Shingrix: plans to get at pharmacy - plans to do cash pay Covid 19: 3/3, pfizer - has had booster   Colonoscopy 06/2019 Dr. Orvan Falconer, lymphocytic colitis, was recommended 5 year recall 2025 EGD 06/2019  MGM 06/2021 -- Due 2023 Dec. For Dexa 06/2022 DEXA has never had - 11/2021    Last eye: Dr. Lucretia Roers, last 2024, will schedule Last dental: Dr. Cyndee Brightly, last 03/2022   MEDICARE WELLNESS OBJECTIVES: Physical activity: Current Exercise  Habits: The patient does not participate in regular exercise at present, Exercise limited by: neurologic condition(s);cardiac condition(s);respiratory conditions(s) Cardiac risk factors: Cardiac Risk Factors include: advanced age (>26men, >43 women);dyslipidemia;Other (see comment) Depression/mood screen:      06/20/2022   10:20 PM  Depression screen PHQ 2/9  Decreased Interest 0  Down, Depressed, Hopeless 0  PHQ - 2 Score 0    ADLs:     06/20/2022   10:19 PM 05/20/2022   12:23 PM  In your present state of health, do you have any difficulty performing the following activities:  Hearing? 0 0  Vision? 0 0  Difficulty concentrating or making decisions? 1 1  Comment Daughter is caretaker   Walking or climbing stairs? 1 1  Dressing or bathing? 0 0  Doing errands, shopping? 1 0  Preparing Food and eating ? Y Y  Using the Toilet? N N  In the past six months, have you accidently leaked urine? N N  Do you have problems with loss of bowel control? N N  Managing your Medications? Y N  Managing your Finances? Y N  Housekeeping or managing your Housekeeping? Y N     Cognitive Testing  Alert? Yes  Normal Appearance?Yes  Oriented to person? Yes  Place? Yes   Time? Yes  Recall of three objects?  2/3  Can perform simple calculations? Yes  Displays appropriate judgment?Yes  Can read the correct time from a watch face?Yes     08/26/2021    3:15 PM 05/14/2021   12:28 PM 04/22/2020    3:09 PM 02/06/2020   11:22 AM  MMSE - Mini Mental State Exam  Orientation to time 2 5 5 3   Orientation to Place 4 5 5 4   Registration 2 3 3 3   Attention/ Calculation 3 5 5 4   Recall 3 2 3 3   Language- name 2 objects 2 2 2 2   Language- repeat 1 1 1 1   Language- follow 3 step command 3 3 3 3   Language- read & follow direction 1 1 1 1   Write a sentence 1 1 1 1   Copy design 1 1 1 1   Total score 23 29 30 26     EOL planning: Does Patient Have a Medical Advance Directive?: Yes Type of Advance Directive:  Living will Does patient want to make changes to medical advance directive?: No - Patient declined   Review of Systems: Review of Systems  Constitutional: Negative.  Negative for malaise/fatigue and weight loss.  HENT: Negative.  Negative for hearing loss and tinnitus.   Eyes: Negative.  Negative for blurred vision and double vision.  Respiratory: Negative.  Negative for cough,  shortness of breath and wheezing.   Cardiovascular: Negative.  Negative for chest pain, palpitations, orthopnea, claudication and leg swelling.  Gastrointestinal: Negative.  Negative for abdominal pain, blood in stool, constipation, diarrhea, heartburn, melena, nausea and vomiting.  Genitourinary: Negative.   Musculoskeletal:  Positive for falls. Negative for joint pain and myalgias.  Skin: Negative.  Negative for rash.  Neurological: Negative.  Negative for dizziness, tingling, sensory change, weakness and headaches.  Endo/Heme/Allergies: Negative.  Negative for polydipsia.  Psychiatric/Behavioral:  Positive for memory loss (frequently asks the same questions). Negative for depression, substance abuse (only 1 drink/night) and suicidal ideas. The patient is not nervous/anxious and does not have insomnia.   All other systems reviewed and are negative.   Physical Exam: Estimated body mass index is 21.26 kg/m as calculated from the following:   Height as of 04/29/22: 5\' 3"  (1.6 m).   Weight as of this encounter: 120 lb (54.4 kg). BP (!) 143/83   Pulse 67   Temp 98.7 F (37.1 C)   Wt 120 lb (54.4 kg)   SpO2 99%   BMI 21.26 kg/m  General Appearance: elderly frail appearing female in no apparent distress Eyes: PERRLA, EOMs, conjunctiva no swelling or erythema Sinuses: No Frontal/maxillary tenderness ENT/Mouth: Ext aud canals clear, normal light reflex with TMs without erythema, bulging.  No erythema, swelling, or exudate on post pharynx. Tonsils not swollen or erythematous. Hearing good Neck: Supple, thyroid  normal. No bruits  Respiratory: Respiratory effort normal, BS equal bilaterally without rales, rhonchi, wheezing or stridor.  Cardio: RRR with 2/6 systolic murmur without rubs or gallops.  Chest: symmetric, with normal excursions and percussion.  Abdomen: Soft, nontender, no guarding, rebound, hernias, masses, or organomegaly.  Lymphatics: Non tender without lymphadenopathy.  Musculoskeletal: Full ROM all peripheral extremities, good grip strength, non-antalgic, unsteady gait.  Skin: Warm, dry; fragile without rashes, lesions; see photo for R shin wound, mildly erythematous, mild swelling, dry yellow discharge, expresses serous discharge only, no foul odor Neuro: Cranial nerves intact,,no cog wheeling, Normal muscle tone, wide based stance,  Neg pronator drift Psych: Awake and oriented X 3, normal affect, Insight and Judgment appropriate  Medicare Attestation I have personally reviewed: The patient's medical and social history Their use of alcohol, tobacco or illicit drugs Their current medications and supplements The patient's functional ability including ADLs,fall risks, home safety risks, cognitive, and hearing and visual impairment Diet and physical activities Evidence for depression or mood disorders  The patient's weight, height, BMI, and visual acuity have been recorded in the chart.  I have made referrals, counseling, and provided education to the patient based on review of the above and I have provided the patient with a written personalized care plan for preventive services.     Sonora Catlin 9:17 AM [facility]

## 2023-04-27 NOTE — Patient Instructions (Signed)

## 2023-05-02 ENCOUNTER — Encounter: Payer: Self-pay | Admitting: Nurse Practitioner

## 2023-05-25 ENCOUNTER — Ambulatory Visit: Payer: Medicare Other | Admitting: Nurse Practitioner

## 2023-09-20 ENCOUNTER — Encounter: Payer: Medicare Other | Admitting: Nurse Practitioner

## 2023-09-21 DIAGNOSIS — Z23 Encounter for immunization: Secondary | ICD-10-CM | POA: Diagnosis not present

## 2023-09-23 ENCOUNTER — Encounter: Payer: Self-pay | Admitting: Nurse Practitioner

## 2023-09-23 ENCOUNTER — Ambulatory Visit (INDEPENDENT_AMBULATORY_CARE_PROVIDER_SITE_OTHER): Payer: Medicare Other | Admitting: Nurse Practitioner

## 2023-09-23 VITALS — BP 112/78 | HR 89 | Temp 98.2°F | Ht 63.5 in | Wt 120.8 lb

## 2023-09-23 DIAGNOSIS — R0989 Other specified symptoms and signs involving the circulatory and respiratory systems: Secondary | ICD-10-CM

## 2023-09-23 DIAGNOSIS — E559 Vitamin D deficiency, unspecified: Secondary | ICD-10-CM

## 2023-09-23 DIAGNOSIS — E782 Mixed hyperlipidemia: Secondary | ICD-10-CM

## 2023-09-23 DIAGNOSIS — F1019 Alcohol abuse with unspecified alcohol-induced disorder: Secondary | ICD-10-CM

## 2023-09-23 DIAGNOSIS — Z131 Encounter for screening for diabetes mellitus: Secondary | ICD-10-CM

## 2023-09-23 DIAGNOSIS — I10 Essential (primary) hypertension: Secondary | ICD-10-CM

## 2023-09-23 DIAGNOSIS — Z136 Encounter for screening for cardiovascular disorders: Secondary | ICD-10-CM

## 2023-09-23 DIAGNOSIS — D692 Other nonthrombocytopenic purpura: Secondary | ICD-10-CM | POA: Diagnosis not present

## 2023-09-23 DIAGNOSIS — Z79899 Other long term (current) drug therapy: Secondary | ICD-10-CM

## 2023-09-23 DIAGNOSIS — H35039 Hypertensive retinopathy, unspecified eye: Secondary | ICD-10-CM

## 2023-09-23 DIAGNOSIS — M8580 Other specified disorders of bone density and structure, unspecified site: Secondary | ICD-10-CM

## 2023-09-23 DIAGNOSIS — L738 Other specified follicular disorders: Secondary | ICD-10-CM

## 2023-09-23 DIAGNOSIS — F3342 Major depressive disorder, recurrent, in full remission: Secondary | ICD-10-CM

## 2023-09-23 DIAGNOSIS — J439 Emphysema, unspecified: Secondary | ICD-10-CM

## 2023-09-23 DIAGNOSIS — Z0001 Encounter for general adult medical examination with abnormal findings: Secondary | ICD-10-CM

## 2023-09-23 DIAGNOSIS — Z952 Presence of prosthetic heart valve: Secondary | ICD-10-CM

## 2023-09-23 DIAGNOSIS — Z1389 Encounter for screening for other disorder: Secondary | ICD-10-CM

## 2023-09-23 DIAGNOSIS — I700 Atherosclerosis of aorta: Secondary | ICD-10-CM

## 2023-09-23 DIAGNOSIS — Z1329 Encounter for screening for other suspected endocrine disorder: Secondary | ICD-10-CM

## 2023-09-23 NOTE — Patient Instructions (Signed)

## 2023-09-23 NOTE — Progress Notes (Signed)
ANNUAL PHYSICAL  Assessment and Plan:  CPE Due Annually Health maintenance reviewed  Aortic atherosclerosis (HCC) - CT 05/2020 Control blood pressure, cholesterol, glucose, increase exercise.   Pulmonary emphysema, unspecified emphysema type (HCC) Per imaging, remote smoker, denies sx; monitor  Recurrent major depressive disorder, in full remission (HCC) Continue Wellbutrin; avoiding alcohol Lifestyle discussed: diet/exerise, sleep hygiene, stress management, hydration  Alcohol abuse with alcohol-induced disorder (HCC) Has reduced significantly currently avoiding continue to monitor closely Avoid sedating medications  Mixed hyperlipidemia Controlled Continue medications;  Discussed lifestyle modifications. Recommended diet heavy in fruits and veggies, omega 3's. Decrease consumption of animal meats, cheeses, and dairy products. Remain active and exercise as tolerated. Continue to monitor.  Senile purpura (HCC) Protect skin, sunscreen  H/O prosthetic aortic valve replacement Continue follow up cardio  Vitamin D deficiency Continue supplement  Medication management All medications discussed and reviewed in full. All questions and concerns regarding medications addressed.    Hypertensive retinopathy, unspecified laterality Followed by Dr. Lucretia Roers Keep BP well controlled. Continue to monitor.   Osteopenia Pursue a combination of weight-bearing exercises and strength training. Patients with severe mobility impairment should be referred for physical therapy. Advised on fall prevention measures including proper lighting in all rooms, removal of area rugs and floor clutter, use of walking devices as deemed appropriate, avoidance of uneven walking surfaces. Smoking cessation and moderate alcohol consumption if applicable Consume 800 to 1000 IU of vitamin D daily with a goal vitamin D serum value of 30 ng/mL or higher. Aim for 1000 to 1200 mg of elemental calcium daily  through supplements and/or dietary sources.  Throat Clearing Discussed restarting antihistamine versus H2 blcoker for acid reflux - do not start at the same time Continue to monitor   Sebaceous Hyperplasia Freeze Thaw Method x3 - patient tolerated well Continue to monitor Contact office if s/s fail to improve.   Orders Placed This Encounter  Procedures   CBC with Differential/Platelet   COMPLETE METABOLIC PANEL WITH GFR   Magnesium   Lipid panel   TSH   Hemoglobin A1c   Insulin, random   VITAMIN D 25 Hydroxy (Vit-D Deficiency, Fractures)   Urinalysis, Routine w reflex microscopic   Microalbumin / creatinine urine ratio   Vitamin B12   EKG 12-Lead    Notify office for further evaluation and treatment, questions or concerns if any reported s/s fail to improve.   The patient was advised to call back or seek an in-person evaluation if any symptoms worsen or if the condition fails to improve as anticipated.   Further disposition pending results of labs. Discussed med's effects and SE's.    I discussed the assessment and treatment plan with the patient. The patient was provided an opportunity to ask questions and all were answered. The patient agreed with the plan and demonstrated an understanding of the instructions.  Discussed med's effects and SE's. Screening labs and tests as requested with regular follow-up as recommended.  I provided 35 minutes of face-to-face time during this encounter including counseling, chart review, and critical decision making was preformed.  Today's Plan of Care is based on a patient-centered health care approach known as shared decision making - the decisions, tests and treatments allow for patient preferences and values to be balanced with clinical evidence.     Future Appointments  Date Time Provider Department Center  07/06/2022 10:00 AM Sundra Aland, RPH [facility] None  08/04/2022 11:10 AM Tressia Danas, MD [facility]   08/26/2022  3:00 PM Louay Myrie,  Archie Patten, NP [facility] None  09/06/2022  1:30 PM Penumalli, Glenford Bayley, MD [facility] None  05/25/2023 11:30 AM Adela Glimpse, NP [facility] None     HPI   82 y.o. female  presents to clinic accompanied by daughter for annual physical and follow up for has Alcohol abuse with alcohol-induced disorder (HCC); Medication management; Mixed hyperlipidemia; H/O prosthetic aortic valve replacement; B12 deficiency; Vitamin D deficiency; Major depression in complete remission (HCC); Lung nodules; Lymphocytic colitis; Senile purpura (HCC); Aortic atherosclerosis (HCC) - CT 05/2020; Emphysema of lung (HCC) - CT 05/2020; Dementia (HCC); Thiamine deficiency; At high risk for falls; Osteopenia; DDD (degenerative disc disease), lumbar; and Lumbar back pain on their problem list.   Patient presents today accompanied by daughter.   She has been living at [facility] for the last year and is doing well.  She has not new concerns to report in clinic today.    She has noticed increase in clearing of throat.  No choking.  Able to swallow all substances and textures of foods and liquids.  Wakes to up some clearing with coughing.  Unsure if triggered by reflux, does not c/o burning sensation in esophagus or globulus sensation.  Admits to taking Benadryl in the past but has not taken is several months.    She also has an area to the lower part of the left anterior neck that is round and raised.  Dermatology has reviewed in the past. No concern for cancer.  Mostly likely a sebaceous hyperplasia.  No drainage.  No pain or erythema.    She was seen in the ER on 04/29/22 for back pain.  She had also reported hitting her right ankle with her left ankle and her right ankle is was swollen and bruised.  She reported intermittent BLE swelling.  MRI was completed to show a compression fracture in the back are which was the cause of presenting back pain.  Edema was noted to be from dependent.  X-ray of right  ankle was negative for break, only contusion.  Patient was also provided UA d/t confusion, which was normal.  She was treated with Tramadol and released.    Prior to her visit to the ED on 04/29/22, she was also seen on 04/27/22 again for back pain.  She had been treated in office with Gabapentin and Prednisone.  X-ray prior to ER visit noted significant lumbar disc disease, shifting of vertebra, arthritis and compression type changes.   She has history of anxiety, alcohol abuse, memory problems; her husband had kidney cancer and passed 2020. During that time she moved in with her daughter d/t memory concerns.  She was on numerous mood meds without benefit, concern with trazodone/benzo with alcohol and was stopped, daughter is limiting alcohol recently to 1/day. Memory continues to be an issue, frequently asks the same questions.   She has seen Dr. Wendie Simmer, last visit 01/2020, normal MRI  Brain 04/2019. Last MMSE 29. 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 She has had low vitamin B levels in the past.  She has since avoid alcohol.  Her memory continues to be affected.  She was found to have lymphocytic colitis on colonoscopy in 06/2019, is on budesonide and is following with GI Dr. Orvan Falconer, well controlled.  Had CT 06/10/2020 showing stable 6 mm left lower lobe nodule, and has been asked to follow up with  yearly imaging, but has not continued.  She also showed emphysematous changes and extensive aortic atherosclerosis. She is remote former smoker.   BMI is Body mass index is 21.06 kg/m., she has not been working on diet and exercise. Wt Readings from Last 3 Encounters:  09/23/23 120 lb 12.8 oz (54.8 kg)  04/27/23 108 lb 9.6 oz (49.3 kg)  01/25/23 104 lb 6.4 oz (47.4 kg)     Her blood pressure has been controlled at home, today their BP  is BP: 122/90 She does not workout. She denies chest pain, shortness of breath, dizziness.   She has a history of hyperlipidemia on liptior and has history of bovine aortic valve replacement x 2, last echo was 05/2019, following with Dr. Royann Shivers  She is not on cholesterol medication and denies myalgias. Her cholesterol is at goal. The cholesterol last visit was:   Lab Results  Component Value Date   CHOL 163 12/28/2022   HDL 61 12/28/2022   LDLCALC 85 12/28/2022   TRIG 79 12/28/2022   CHOLHDL 2.7 12/28/2022    Last A1C in the office was:  Lab Results  Component Value Date   HGBA1C 5.4 12/28/2022     Last GFR:  Lab Results  Component Value Date   EGFR 74 12/28/2022   Patient is on Vitamin D supplement.   Lab Results  Last vitamin D Lab Results  Component Value Date   VD25OH 55 12/28/2022   Current Medications:   Current Outpatient Medications (Endocrine & Metabolic):    Budesonide ER 9 MG TB24, TAKE 1 TABLET BY MOUTH DAILY   predniSONE (DELTASONE) 20 MG tablet, 2 tablets daily for 3 days, 1 tablet daily for 4 days.  Current Outpatient Medications (Cardiovascular):    atorvastatin (LIPITOR) 80 MG tablet, Take  1 tablet  Daily for Cholesterol                              /                     TAKE 1 TABLET BY MOUTH   Current Outpatient Medications (Analgesics):    traMADol (ULTRAM) 50 MG tablet, Take 1 tablet (50 mg total) by mouth every 6 (six) hours as needed.   Current Outpatient Medications (Other):    bismuth subsalicylate (PEPTO BISMOL) 262 MG/15ML suspension, Take 30 mLs by mouth every 6 (six) hours as needed.   Cholecalciferol (VITAMIN D) 125 MCG (5000 UT) CAPS, Take 1 tablet by mouth daily.   Multiple Vitamin (MULTIVITAMIN ADULT PO), Take 3,000 Units by mouth. Life Extensions   Pancrelipase, Lip-Prot-Amyl, (CREON) 24000-76000 units CPEP, One with meals and one with snacks   gabapentin (NEURONTIN) 100 MG capsule, Take 1-3 capsules (100-300 mg total) by mouth 3  (three) times daily as needed. For back pain. (Patient not taking: Reported on 05/20/2022)  Allergies:  Allergies  Allergen Reactions   Tizanidine     Hypotension "felt like I had a stroke"    Medical History:  She has Alcohol abuse with alcohol-induced disorder (HCC); Medication management; Mixed hyperlipidemia; H/O prosthetic aortic valve replacement; B12 deficiency; Vitamin D deficiency; Major depression in complete remission (HCC); Lung nodules; Lymphocytic colitis; Senile purpura (HCC); Aortic atherosclerosis (HCC) - CT 05/2020; Emphysema of lung (HCC) - CT 05/2020; Dementia (HCC); Thiamine deficiency; At high risk for falls; Osteopenia; DDD (degenerative disc disease), lumbar; and Lumbar back pain on their problem list.  Patient Care Team: Lucky Cowboy, MD as PCP - General (Internal Medicine) Thurmon Fair, MD as PCP - Cardiology (Cardiology) Suanne Marker, MD as Consulting Physician (Neurology) Charlett Nose, [facility] as Pharmacist (Pharmacist)  Surgical History:  She has a past surgical history that includes Cardiac valuve replacement (1999) and Cardiac valuve replacement (2016). Family History:  Herfamily history includes Breast cancer in her mother; Hyperlipidemia in her mother; Hypertension in her mother; Kidney disease in her father; Parkinson's disease in her father; Parkinsonism in her father; Sleep apnea in her daughter. Social History:  She reports that she quit smoking about 43 years ago. Her smoking use included cigarettes. She has never used smokeless tobacco. She reports current alcohol use of about 28.0 standard drinks of alcohol per week. She reports that she does not use drugs.  Immunization History  Administered Date(s) Administered   Influenza, High Dose Seasonal PF 09/28/2021, 08/29/2022   Moderna Covid-19 Vaccine Bivalent Booster 85yrs & up 11/10/2021   PFIZER(Purple Top)SARS-COV-2 Vaccination 02/19/2020, 03/11/2020, 11/26/2020   Pneumococcal  Conjugate-13 12/18/2019   Pneumococcal Polysaccharide-23 08/03/2021   Td 04/26/2020   Tdap 06/27/2022   Zoster Recombinant(Shingrix) 06/21/2022    Tetanus: 04/2020 Pneumo 23: Today - patient left prior to receiving, plan with next OV Pneum 13: 11/2019 Influenza: 08/2022 Shingrix: plans to get at pharmacy - plans to do cash pay Covid 19: 3/3, pfizer - has had booster   Colonoscopy 06/2019 Dr. Orvan Falconer, lymphocytic colitis, was recommended 5 year recall 2025 EGD 06/2019  MGM 06/2021 -- Due 2023 Dec.  DEXA 11/2021    Last eye: Dr. Lucretia Roers, last 2024, will schedule Last dental: Dr. Cyndee Brightly, last 03/2022  Review of Systems: Review of Systems  Constitutional: Negative.  Negative for malaise/fatigue and weight loss.  HENT: Negative.  Negative for hearing loss and tinnitus.   Eyes: Negative.  Negative for blurred vision and double vision.  Respiratory: Negative.  Negative for cough, shortness of breath and wheezing.   Cardiovascular: Negative.  Negative for chest pain, palpitations, orthopnea, claudication and leg swelling.  Gastrointestinal: Negative.  Negative for abdominal pain, blood in stool, constipation, diarrhea, heartburn, melena, nausea and vomiting.  Genitourinary: Negative.   Musculoskeletal:  Positive for falls. Negative for joint pain and myalgias.  Skin: Reports skin nodule. Negative.  Negative for rash.  Neurological: Negative.  Negative for dizziness, tingling, sensory change, weakness and headaches.  Endo/Heme/Allergies: Negative.  Negative for polydipsia.  Psychiatric/Behavioral:  Positive for memory loss (frequently asks the same questions). Negative for depression, substance abuse (only 1 drink/night) and suicidal ideas. The patient is not nervous/anxious and does not have insomnia.   All other systems reviewed and are negative.   Physical Exam: Estimated body mass index is 21.26 kg/m as calculated from the following:   Height as of 04/29/22: 5\' 3"  (1.6 m).   Weight as of  this encounter: 120 lb (54.4 kg). BP (!) 143/83   Pulse 67   Temp 98.7 F (37.1 C)   Wt 120 lb (54.4 kg)   SpO2 99%   BMI 21.26 kg/m  General Appearance: elderly frail appearing female in no apparent distress Eyes: PERRLA, EOMs, conjunctiva no swelling or erythema Sinuses: No Frontal/maxillary tenderness ENT/Mouth: Ext aud canals clear, normal light reflex with TMs without erythema, bulging.  No erythema, swelling, or exudate on post pharynx. Tonsils not swollen or erythematous. Hearing good Neck: Supple, thyroid normal. No bruits  Respiratory: Respiratory effort normal, BS equal bilaterally without rales, rhonchi, wheezing or stridor.  Cardio: RRR with  2/6 systolic murmur without rubs or gallops.  Chest: symmetric, with normal excursions and percussion.  Abdomen: Soft, nontender, no guarding, rebound, hernias, masses, or organomegaly.  Lymphatics: Non tender without lymphadenopathy.  Musculoskeletal: Full ROM all peripheral extremities, good grip strength, non-antalgic, unsteady gait.  Skin: Warm, dry; fragile without rashes, lesions; see photo for R shin wound, mildly erythematous, mild swelling, dry yellow discharge, expresses serous discharge only, no foul odor Neuro: Cranial nerves intact,,no cog wheeling, Normal muscle tone, wide based stance,  Neg pronator drift Psych: Awake and oriented X 3, normal affect, Insight and Judgment appropriate  EKG:  NSR, PAC and PVC  Gabbie Marzo 9:17 AM [facility]

## 2023-09-24 LAB — COMPLETE METABOLIC PANEL WITH GFR
AG Ratio: 1.4 (calc) (ref 1.0–2.5)
ALT: 16 U/L (ref 6–29)
AST: 22 U/L (ref 10–35)
Albumin: 3.9 g/dL (ref 3.6–5.1)
Alkaline phosphatase (APISO): 90 U/L (ref 37–153)
BUN: 14 mg/dL (ref 7–25)
CO2: 27 mmol/L (ref 20–32)
Calcium: 9.3 mg/dL (ref 8.6–10.4)
Chloride: 104 mmol/L (ref 98–110)
Creat: 0.94 mg/dL (ref 0.60–0.95)
Globulin: 2.8 g/dL (ref 1.9–3.7)
Glucose, Bld: 103 mg/dL — ABNORMAL HIGH (ref 65–99)
Potassium: 4.6 mmol/L (ref 3.5–5.3)
Sodium: 139 mmol/L (ref 135–146)
Total Bilirubin: 0.8 mg/dL (ref 0.2–1.2)
Total Protein: 6.7 g/dL (ref 6.1–8.1)
eGFR: 60 mL/min/{1.73_m2} (ref >=60)

## 2023-09-24 LAB — CBC WITH DIFFERENTIAL/PLATELET
Absolute Monocytes: 473 {cells}/uL (ref 200–950)
Basophils Absolute: 50 {cells}/uL (ref 0–200)
Basophils Relative: 0.8 %
Eosinophils Absolute: 139 {cells}/uL (ref 15–500)
Eosinophils Relative: 2.2 %
HCT: 36.7 % (ref 35.0–45.0)
Hemoglobin: 11.8 g/dL (ref 11.7–15.5)
Lymphs Abs: 1002 {cells}/uL (ref 850–3900)
MCH: 30.3 pg (ref 27.0–33.0)
MCHC: 32.2 g/dL (ref 32.0–36.0)
MCV: 94.1 fL (ref 80.0–100.0)
MPV: 12.2 fL (ref 7.5–12.5)
Monocytes Relative: 7.5 %
Neutro Abs: 4637 {cells}/uL (ref 1500–7800)
Neutrophils Relative %: 73.6 %
Platelets: 177 10*3/uL (ref 140–400)
RBC: 3.9 10*6/uL (ref 3.80–5.10)
RDW: 12.7 % (ref 11.0–15.0)
Total Lymphocyte: 15.9 %
WBC: 6.3 10*3/uL (ref 3.8–10.8)

## 2023-09-24 LAB — URINALYSIS, ROUTINE W REFLEX MICROSCOPIC
Bacteria, UA: NONE SEEN /[HPF]
Bilirubin Urine: NEGATIVE
Glucose, UA: NEGATIVE
Hyaline Cast: NONE SEEN /[LPF]
Nitrite: NEGATIVE
Specific Gravity, Urine: 1.019 (ref 1.001–1.035)
pH: 7 (ref 5.0–8.0)

## 2023-09-24 LAB — MICROALBUMIN / CREATININE URINE RATIO
Creatinine, Urine: 192 mg/dL (ref 20–275)
Microalb Creat Ratio: 49 mg/g{creat} — ABNORMAL HIGH (ref <30)
Microalb, Ur: 9.5 mg/dL

## 2023-09-24 LAB — LIPID PANEL
Cholesterol: 189 mg/dL (ref <200)
HDL: 75 mg/dL (ref >=50)
LDL Cholesterol (Calc): 95 mg/dL
Non-HDL Cholesterol (Calc): 114 mg/dL (ref <130)
Total CHOL/HDL Ratio: 2.5 (calc) (ref <5.0)
Triglycerides: 97 mg/dL (ref <150)

## 2023-09-24 LAB — HEMOGLOBIN A1C
Hgb A1c MFr Bld: 5.6 %{Hb} (ref <5.7)
Mean Plasma Glucose: 114 mg/dL
eAG (mmol/L): 6.3 mmol/L

## 2023-09-24 LAB — INSULIN, RANDOM: Insulin: 15.5 u[IU]/mL

## 2023-09-24 LAB — TSH: TSH: 1.98 m[IU]/L (ref 0.40–4.50)

## 2023-09-24 LAB — VITAMIN D 25 HYDROXY (VIT D DEFICIENCY, FRACTURES): Vit D, 25-Hydroxy: 50 ng/mL (ref 30–100)

## 2023-09-24 LAB — VITAMIN B12: Vitamin B-12: 1057 pg/mL (ref 200–1100)

## 2023-09-24 LAB — MAGNESIUM: Magnesium: 2.1 mg/dL (ref 1.5–2.5)

## 2023-09-28 ENCOUNTER — Telehealth: Payer: Self-pay | Admitting: Cardiovascular Disease

## 2023-09-28 DIAGNOSIS — Z79899 Other long term (current) drug therapy: Secondary | ICD-10-CM

## 2023-09-28 MED ORDER — AMOXICILLIN 500 MG PO CAPS
ORAL_CAPSULE | ORAL | 3 refills | Status: AC
Start: 2023-09-28 — End: 2023-10-12

## 2023-09-28 NOTE — Telephone Encounter (Signed)
Rx sent to pharmacy. Left voicemail to return call to office

## 2023-09-28 NOTE — Telephone Encounter (Signed)
New Message:     Daughter called and says patient is having her teeth cleaned on Friday. She says patient needs an antibiotic called in please.

## 2023-09-28 NOTE — Telephone Encounter (Signed)
Please send in a prescription for amoxicillin 500 mg x 4 (total 2000 mg) to take 1-2 hours before the procedure. Leave 3 refills on that, in case she needs to have additional procedures this year.

## 2023-09-28 NOTE — Telephone Encounter (Signed)
Spoke with daughter per DPR and she states patient is having her teeth cleaned on 10/9 and will need an antibiotic to take before the procedure.

## 2023-10-11 DIAGNOSIS — D485 Neoplasm of uncertain behavior of skin: Secondary | ICD-10-CM | POA: Diagnosis not present

## 2023-10-11 DIAGNOSIS — I8312 Varicose veins of left lower extremity with inflammation: Secondary | ICD-10-CM | POA: Diagnosis not present

## 2023-10-11 DIAGNOSIS — L720 Epidermal cyst: Secondary | ICD-10-CM | POA: Diagnosis not present

## 2023-10-11 DIAGNOSIS — D692 Other nonthrombocytopenic purpura: Secondary | ICD-10-CM | POA: Diagnosis not present

## 2023-10-11 DIAGNOSIS — Z85828 Personal history of other malignant neoplasm of skin: Secondary | ICD-10-CM | POA: Diagnosis not present

## 2023-10-11 DIAGNOSIS — L821 Other seborrheic keratosis: Secondary | ICD-10-CM | POA: Diagnosis not present

## 2023-10-11 DIAGNOSIS — I872 Venous insufficiency (chronic) (peripheral): Secondary | ICD-10-CM | POA: Diagnosis not present

## 2023-10-11 DIAGNOSIS — I8311 Varicose veins of right lower extremity with inflammation: Secondary | ICD-10-CM | POA: Diagnosis not present

## 2023-10-11 DIAGNOSIS — D1801 Hemangioma of skin and subcutaneous tissue: Secondary | ICD-10-CM | POA: Diagnosis not present

## 2023-11-05 ENCOUNTER — Other Ambulatory Visit: Payer: Self-pay | Admitting: Nurse Practitioner

## 2023-11-05 DIAGNOSIS — F331 Major depressive disorder, recurrent, moderate: Secondary | ICD-10-CM

## 2023-12-08 DIAGNOSIS — Z23 Encounter for immunization: Secondary | ICD-10-CM | POA: Diagnosis not present

## 2023-12-11 DIAGNOSIS — I491 Atrial premature depolarization: Secondary | ICD-10-CM | POA: Diagnosis not present

## 2023-12-11 DIAGNOSIS — W19XXXA Unspecified fall, initial encounter: Secondary | ICD-10-CM | POA: Diagnosis not present

## 2023-12-11 DIAGNOSIS — R457 State of emotional shock and stress, unspecified: Secondary | ICD-10-CM | POA: Diagnosis not present

## 2023-12-13 ENCOUNTER — Telehealth: Payer: Self-pay | Admitting: Emergency Medicine

## 2023-12-13 ENCOUNTER — Encounter: Payer: Self-pay | Admitting: Cardiovascular Disease

## 2023-12-13 NOTE — Telephone Encounter (Signed)
Spoke with Lauren (dpr)- she has been communicating with Dr Royann Shivers about some information that was faxed to our office yesterday regarding her mother. I looked in our Onbase Fax system and did not find anything that was faxed in regards to her mother.  Asked her if she would be able to take screenshot of these documents and send this information through MyChart. She said that when she gets back home she will do this. I apologized for the extra work she is having to do.

## 2023-12-16 ENCOUNTER — Ambulatory Visit: Payer: Medicare Other | Attending: Cardiovascular Disease | Admitting: Cardiovascular Disease

## 2023-12-16 ENCOUNTER — Encounter: Payer: Self-pay | Admitting: Cardiovascular Disease

## 2023-12-16 VITALS — BP 81/55 | HR 86 | Ht 64.0 in | Wt 121.0 lb

## 2023-12-16 DIAGNOSIS — I959 Hypotension, unspecified: Secondary | ICD-10-CM

## 2023-12-16 DIAGNOSIS — E782 Mixed hyperlipidemia: Secondary | ICD-10-CM | POA: Diagnosis not present

## 2023-12-16 DIAGNOSIS — Z952 Presence of prosthetic heart valve: Secondary | ICD-10-CM | POA: Diagnosis not present

## 2023-12-16 DIAGNOSIS — R4189 Other symptoms and signs involving cognitive functions and awareness: Secondary | ICD-10-CM

## 2023-12-16 MED ORDER — MIDODRINE HCL 5 MG PO TABS: 5.0000 mg | ORAL_TABLET | Freq: Three times a day (TID) | ORAL | 3 refills | Status: DC

## 2023-12-16 NOTE — Progress Notes (Signed)
Cardiology office note   Date:  12/16/2023   ID:  Mylisa, Zimny 1941-02-16, MRN [id number]  Patient Location: Home Provider Location: Office  PCP:  Lucky Cowboy, MD  Cardiologist:  New / Helana Macbride Electrophysiologist:  None   Evaluation Performed: Follow-up  Chief Complaint:  syncope  History of Present Illness:    AKYAH KELLOM is a 82 y.o. 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  Mrs. Wheeley now resides in independent living at [facility].  She is settled in there pretty well.  On December 15 in the mid afternoon she fell.  It appears that she lost consciousness.  She was standing up but does not remember how she ended up on the floor and she woke up with paramedics standing over her.  Apparently when she tried to stand up she passed out again.  Paramedics documented a blood pressure of 130/88, but I suspect that she was lying down when that was obtained.  Thankfully she has no serious injuries.  She has had previous falls that have been associated with small skin scrapes.  She denies any problems with chest pain or shortness of breath or any palpitations.  She has not had lower extremity edema.  She has lost quite a bit of weight over the last few years and has a BMI under 21, although she has gained back a little weight after she moved to [facility].  She never eats breakfast.  She generally has a single cup of coffee a day, but drinks iced tea at lunch and dinner and also has 1 or 2 glasses of wine with dinner.  Echocardiogram performed in June 2020 and May 2021 and May 2023 have shown virtually no change in aortic valve parameters, when compared with the most recent echocardiogram from July 2019 from [place].   Past Medical History:  Diagnosis Date   Alcohol abuse    Anxiety    Depression     Hyperlipidemia    Lymphocytic colitis    Malaria    as a child   Memory loss    Past Surgical History:  Procedure Laterality Date   CARDIAC VALVE SURGERY  1999   Aortic valve   CARDIAC VALVE SURGERY  2016   Aortic valve     Current Meds  Medication Sig   atorvastatin (LIPITOR) 80 MG tablet Take  1 tablet  Daily for Cholesterol                                                                                                         /                                                TAKE  BY                                             MOUTH   bismuth subsalicylate (PEPTO BISMOL) 262 MG/15ML suspension Take 30 mLs by mouth every 6 (six) hours as needed.   buPROPion (WELLBUTRIN XL) 150 MG 24 hr tablet TAKE 1 TABLET(150 MG) BY MOUTH EVERY MORNING   Cholecalciferol (VITAMIN D) 125 MCG (5000 UT) CAPS Take 1 tablet by mouth daily.   midodrine (PROAMATINE) 5 MG tablet Take 1 tablet (5 mg total) by mouth 3 (three) times daily with meals.   Multiple Vitamin (MULTIVITAMIN ADULT PO) Take 3,000 Units by mouth. Life Extensions   Multiple Vitamins-Minerals (ZINC PO) Take by mouth. 2 days a week   Pancrelipase, Lip-Prot-Amyl, (CREON) 24000-76000 units CPEP One with meals and one with snacks     Allergies:   Tizanidine   Social History   Tobacco Use   Smoking status: Former    Current packs/day: 0.00    Types: Cigarettes    Quit date: 1980    Years since quitting: 45.0   Smokeless tobacco: Never  Vaping Use   Vaping status: Never Used  Substance Use Topics   Alcohol use: Yes    Alcohol/week: 28.0 standard drinks of alcohol    Types: 28 Standard drinks or equivalent per week    Comment: 2 glasses nightly   Drug use: Never     Family Hx: The patient's family history includes Breast cancer in her mother; Hyperlipidemia in her mother; Hypertension in her mother; Kidney disease in her father; Parkinson's disease in her father; Parkinsonism in her father;  Sleep apnea in her daughter. There is no history of Colon cancer, Esophageal cancer, Rectal cancer, or Stomach cancer.  ROS:   Please see the history of present illness.     All other systems reviewed and are negative.   Prior CV studies:   The following studies were reviewed today:  Echo report from [place] 07/03/2018 peak aortic prosthesis velocity 3.14 m/s, mean gradient 20 mmHg  ECHO 05/26/2022   1. Left ventricular ejection fraction, by estimation, is 60 to 65%. The  left ventricle has normal function. The left ventricle has no regional  wall motion abnormalities. There is moderate left ventricular hypertrophy.  Left ventricular diastolic  parameters are consistent with Grade I diastolic dysfunction (impaired  relaxation).   2. Right ventricular systolic function is normal. The right ventricular  size is mildly enlarged. There is normal pulmonary artery systolic  pressure. The estimated right ventricular systolic pressure is 24.3 mmHg.   3. The mitral valve is normal in structure. Trivial mitral valve  regurgitation. No evidence of mitral stenosis.   4. There is a 23 mm Edwards Sapien prosthetic, stented (TAVR) valve  present in the aortic position.      Aortic valve regurgitation is not visualized. Vmax 2.5 m/s, EOA 1.2  cm^2, DI 0.31. MG , improved from prior echo ( on 04/29/21)   5. The inferior vena cava is normal in size with greater than 50%  respiratory variability, suggesting right atrial pressure of 3 mmHg.   Labs/Other Tests and Data Reviewed:    EKG: Reviewed ECG tracings from EMS from 12/11/2023 which shows sinus rhythm with PVCs in regular pattern, every 5 beats", LVH by voltage criteria only.  No change from the ECG performed 08/23/2023.  EKG  Interpretation Date/Time:    Ventricular Rate:    PR Interval:    QRS Duration:    QT Interval:    QTC Calculation:   R Axis:      Text Interpretation:           Recent Labs: 09/23/2023: ALT 16;  BUN 14; Creat 0.94; Hemoglobin 11.8; Magnesium 2.1; Platelets 177; Potassium 4.6; Sodium 139; TSH 1.98   Recent Lipid Panel Lab Results  Component Value Date/Time   CHOL 189 09/23/2023 11:00 AM   TRIG 97 09/23/2023 11:00 AM   HDL 75 09/23/2023 11:00 AM   CHOLHDL 2.5 09/23/2023 11:00 AM   LDLCALC 95 09/23/2023 11:00 AM    Wt Readings from Last 3 Encounters:  12/16/23 121 lb (54.9 kg)  09/23/23 120 lb 12.8 oz (54.8 kg)  04/27/23 108 lb 9.6 oz (49.3 kg)     Objective:    Vital Signs:  BP (!) 81/55 (BP Location: Left Arm, Patient Position: Sitting, Cuff Size: Normal)   Pulse 86   Ht 5\' 4"  (1.626 m)   Wt 121 lb (54.9 kg)   SpO2 97%   BMI 20.77 kg/m    Her blood pressure is equal in the right and left upper extremities.  Orthostatic blood pressure checked in the office today Supine 91/60 Sitting 88/59 Standing 74/50, minimally dizzy   General: Alert, oriented x3, no distress, lean, appears very thin and frail Head: no evidence of trauma, PERRL, EOMI, no exophtalmos or lid lag, no myxedema, no xanthelasma; normal ears, nose and oropharynx Neck: normal jugular venous pulsations and no hepatojugular reflux; brisk carotid pulses without delay and no carotid bruits Chest: clear to auscultation, no signs of consolidation by percussion or palpation, normal fremitus, symmetrical and full respiratory excursions Cardiovascular: normal position and quality of the apical impulse, regular rhythm with periodic premature beats, normal first and second heart sounds, early peaking 1/6 aortic ejection murmur, no diastolic murmurs, rubs or gallops Abdomen: no tenderness or distention, no masses by palpation, no abnormal pulsatility or arterial bruits, normal bowel sounds, no hepatosplenomegaly Extremities: no clubbing, cyanosis or edema; 2+ radial, ulnar and brachial pulses bilaterally; 2+ right femoral, posterior tibial and dorsalis pedis pulses; 2+ left femoral, posterior tibial and dorsalis pedis  pulses; no subclavian or femoral bruits Neurological: grossly nonfocal.  Short-term memory has clearly deteriorated and we have to go over the explanation for her symptoms and instructions several times. Psych: Normal mood and affect    1. Hypotension, unspecified hypotension type     ASSESSMENT & PLAN:    Hypotension: She has significant resting hypotension as well as orthostatic worsening of hypotension.  Suspect this may be at least in part due to hypovolemia due to poor intake of fluids, but there also may be a component of autonomic dysfunction.  Encouraged her to drink a minimum of 1800 mL of fluid every day, not included the caffeinated beverages such as coffee, tea or wine.  Also encouraged a diet enriched in sodium.  She should use added salt liberally to her meals and can also eat salty snacks.  I think this may be insufficient for her degree of hypotension and have also given a prescription for midodrine 2.5 mg to take first thing in the morning before getting out of bed and then again 4-5 hours later probably just after lunchtime.  She should never take the midodrine less than 4 hours before bedtime.  Check blood pressure at home 2 hours after the medications administered.  Reassess  in clinic in about a month.  She has not had problems with hyponatremia or hypokalemia and does not have other features that raise concern for adrenal insufficiency but we may need to explore this in the future. AS s/p (redo) AVR: Aortic prosthesis gradients have been mildly elevated, but essentially unchanged for at least the last 4 years.  There is no evidence of progressive prosthetic decline, but rather some degree of prosthetic valve mismatch (valve in valve 23 mm TAVR).  In view of the recent syncopal event we will repeat the echocardiogram, but suspect this has nothing to do with aortic stenosis..  Asked her to call sooner for any symptoms of exertional dyspnea, exertional angina or exertional syncope.  She  has a standing prescription for amoxicillin before dental procedures and understands how to take SBE prophylaxis. Hyperlipidemia: She does not have known CAD or PAD.  Her most recent LDL cholesterol 95 is acceptable.  (Target less than 100). Cognitive deficits: These have clearly progressed.  Her daughter Leotis Shames was here with her today.  We had to explain our understanding over medical condition and the plan of action several times.  She clearly understood it, but then would quickly forget it.  She will require supervision going forward.  Patient Instructions  Medication Instructions:   Start Midodrine 5mg  first thing in morning before getting out bed then Next dose at lunch time    *If you need a refill on your cardiac medications before your next appointment, please call your pharmacy*   Lab Work: none If you have labs (blood work) drawn today and your tests are completely normal, you will receive your results only by: MyChart Message (if you have MyChart) OR A paper copy in the mail If you have any lab test that is abnormal or we need to change your treatment, we will call you to review the results.   Testing/Procedures: ECHO first available  before the next visit Jan 16, 2024  Your physician has requested that you have an echocardiogram. Echocardiography is a painless test that uses sound waves to create images of your heart. It provides your doctor with information about the size and shape of your heart and how well your heart's chambers and valves are working. This procedure takes approximately one hour. There are no restrictions for this procedure. Please do NOT wear cologne, perfume, aftershave, or lotions (deodorant is allowed). Please arrive 15 minutes prior to your appointment time.  Please note: We ask at that you not bring children with you during ultrasound (echo/ vascular) testing. Due to room size and safety concerns, children are not allowed in the ultrasound rooms during  exams. Our front office staff cannot provide observation of children in our lobby area while testing is being conducted. An adult accompanying a patient to their appointment will only be allowed in the ultrasound room at the discretion of the ultrasound technician under special circumstances. We apologize for any inconvenience.    Follow-Up: At [facility], you and your health needs are our priority.  As part of our continuing mission to provide you with exceptional heart care, we have created designated Provider Care Teams.  These Care Teams include your primary Cardiologist (physician) and Advanced Practice Providers (APPs -  Physician Assistants and Nurse Practitioners) who all work together to provide you with the care you need, when you need it.  We recommend signing up for the patient portal called "MyChart".  Sign up information is provided on this After Visit Summary.  MyChart is used to connect with patients for Virtual Visits (Telemedicine).  Patients are able to view lab/test results, encounter notes, upcoming appointments, etc.  Non-urgent messages can be sent to your provider as well.   To learn more about what you can do with MyChart, go to [email or web address].    Your next appointment:   Jan.  20 2025 @ 10:00  Provider:   Thurmon Fair, MD     Other Instructions  Your provider has recommended you start sodium rich diet and 1800 ml of water a day        Signed, Thurmon Fair, MD  12/16/2023 12:01 PM    [facility]

## 2023-12-16 NOTE — Patient Instructions (Addendum)
Medication Instructions:   Start Midodrine 5mg  first thing in morning before getting out bed then Next dose at lunch time    *If you need a refill on your cardiac medications before your next appointment, please call your pharmacy*   Lab Work: none If you have labs (blood work) drawn today and your tests are completely normal, you will receive your results only by: MyChart Message (if you have MyChart) OR A paper copy in the mail If you have any lab test that is abnormal or we need to change your treatment, we will call you to review the results.   Testing/Procedures: ECHO first available  before the next visit Jan 16, 2024  Your physician has requested that you have an echocardiogram. Echocardiography is a painless test that uses sound waves to create images of your heart. It provides your doctor with information about the size and shape of your heart and how well your heart's chambers and valves are working. This procedure takes approximately one hour. There are no restrictions for this procedure. Please do NOT wear cologne, perfume, aftershave, or lotions (deodorant is allowed). Please arrive 15 minutes prior to your appointment time.  Please note: We ask at that you not bring children with you during ultrasound (echo/ vascular) testing. Due to room size and safety concerns, children are not allowed in the ultrasound rooms during exams. Our front office staff cannot provide observation of children in our lobby area while testing is being conducted. An adult accompanying a patient to their appointment will only be allowed in the ultrasound room at the discretion of the ultrasound technician under special circumstances. We apologize for any inconvenience.    Follow-Up: At [facility], you and your health needs are our priority.  As part of our continuing mission to provide you with exceptional heart care, we have created designated Provider Care Teams.  These Care Teams include  your primary Cardiologist (physician) and Advanced Practice Providers (APPs -  Physician Assistants and Nurse Practitioners) who all work together to provide you with the care you need, when you need it.  We recommend signing up for the patient portal called "MyChart".  Sign up information is provided on this After Visit Summary.  MyChart is used to connect with patients for Virtual Visits (Telemedicine).  Patients are able to view lab/test results, encounter notes, upcoming appointments, etc.  Non-urgent messages can be sent to your provider as well.   To learn more about what you can do with MyChart, go to [email or web address].    Your next appointment:   Jan.  20 2025 @ 10:00  Provider:   Thurmon Fair, MD     Other Instructions  Your provider has recommended you start sodium rich diet and 1800 ml of water a day

## 2024-01-03 ENCOUNTER — Ambulatory Visit: Payer: Medicare Other | Admitting: Internal Medicine

## 2024-01-10 ENCOUNTER — Ambulatory Visit: Payer: Medicare Other | Admitting: Internal Medicine

## 2024-01-10 NOTE — Progress Notes (Signed)
 R  E S  C  H  E  D  U  L  E  D   due to wait   NEEDS 1 year CPE  - Tonita MORITA      ADULT   &   ADOLESCENT      INTERNAL MEDICINE  Elsie Tonita, M.D.          Lonell Rous, ANP        Bascom Necessary, FNP  [facility] [address]  [place], [place]. [address] Telephone [phone] Telefax [phone]   Future Appointments  Date Time Provider Department  01/10/2024                           3 mo   3:30 PM                 Tonita Elsie, MD [facility]  01/16/2024 10:00 AM Croitoru, Jerel, MD [facility]  04/26/2024                           wellness 11:00 AM Necessary Bascom, NP [facility]  09/24/2024                           cpe 10:00 AM Necessary Bascom, NP [facility]    History of Present Illness:      This very nice 83 y.o. WWF presents for 3 month follow up for expectant monitoring of elevated BP,   HLD, Pre-Diabetes and Vitamin D  Deficiency.   Patient has Dementia  and is followed by Dr Margaret.  Patient is now living at [facility] [address] since July 2023.  CT scan in 2021 showed Aortic Atherosclerosis        Patient is followed expectantly for labile HTN . Today's BP  is at goal - 120/68 . Patient has had no complaints of any cardiac type chest pain, palpitations, dyspnea chet /PND, dizziness, claudication or dependent edema.        Hyperlipidemia is controlled with diet & Atorvastatin  . Patient denies myalgias or other med SE's. Last Lipids were at goal :  Lab Results  Component Value Date   CHOL 189 09/23/2023   HDL 75 09/23/2023   LDLCALC 95 09/23/2023   TRIG 97  09/23/2023   CHOLHDL 2.5 09/23/2023  Also, the patient has history of PreDiabetes and has had no symptoms of reactive hypoglycemia, diabetic polys, paresthesias or visual blurring.  Last A1c was at goal :  Lab Results  Component Value Date   HGBA1C 5.6 09/23/2023                                                       Further, the patient also has history of Vitamin D  Deficiency and supplements vitamin D  without any suspected side-effects. Last vitamin D  was near goal :  Lab Results  Component Value Date   VD25OH 60 09/23/2023       Current Outpatient Medications  Medication Instructions   atorvastatin  80 MG tablet Take  1 tablet  Daily    PEPTO BISMOL 30 mLs,  Every 6 hours PRN   buPROPion   XL  150 MG  TAKE 1 TABLET EVERY MORNING   VITAMIN D  5000 u 1 tablet  Daily   VITAMIN K2 - MK-7   Oral   Multiple Vitamin  3,000 Units Life Extensions   CREON  24,000-76,000 units CPEP One with meals and one with snacks    No Active Allergies   Past Medical History:  Diagnosis Date   Alcohol abuse    Anxiety    Depression    Hyperlipidemia    Lymphocytic colitis    Malaria    as a child   Memory loss      Immunization History  Administered Date(s) Administered   Influenza, High Dose   09/28/2021   Moderna Covid-19 Vacc   11/10/2021   PFIZER SARS-COV-2 Vacc  02/19/2020, 03/11/2020, 11/26/2020   Pneumococcal  - 13 12/18/2019   Pneumococcal  - 23 08/03/2021   Td 04/26/2020     Past Surgical History:  Procedure Laterality Date   CARDIAC VALVE SURGERY  1999   Aortic valve   CARDIAC VALVE SURGERY  2016   Aortic valve    FHx:    Reviewed / unchanged   SHx:    Reviewed / unchanged    Systems Review:  Constitutional: Denies fever, chills, wt changes, headaches, insomnia, fatigue, night sweats, change in appetite. Eyes: Denies redness, blurred vision, diplopia, discharge, itchy, watery eyes.  ENT: Denies discharge, congestion, post nasal drip, epistaxis, sore  throat, earache, hearing loss, dental pain, tinnitus, vertigo, sinus pain, snoring.  CV: Denies chest pain, palpitations, irregular heartbeat, syncope, dyspnea, diaphoresis, orthopnea, PND, claudication or edema. Respiratory: denies cough, dyspnea, DOE, pleurisy, hoarseness, laryngitis, wheezing.  Gastrointestinal: Denies dysphagia, odynophagia, heartburn, reflux, water brash, abdominal pain or cramps, nausea, vomiting, bloating, diarrhea, constipation, hematemesis, melena, hematochezia  or hemorrhoids. Genitourinary: Denies dysuria, frequency, urgency, nocturia, hesitancy, discharge, hematuria or flank pain. Musculoskeletal: Denies arthralgias, myalgias, stiffness, jt. swelling, pain, limping or strain/sprain.  Skin: Denies pruritus, rash, hives, warts, acne, eczema or change in skin lesion(s). Neuro: No weakness, tremor, incoordination, spasms, paresthesia or pain. Psychiatric: Denies confusion, memory loss or sensory loss. Endo: Denies change in weight, skin or hair change.  Heme/Lymph: No excessive bleeding, bruising or enlarged lymph nodes.  Physical Exam  There were no vitals taken for this visit.  Appears  well nourished, well groomed  and in no distress.  Hoarse raspy laryngitic voice . Dry cough w/o stridor.   Eyes: PERRLA, EOMs, conjunctiva no swelling or erythema. Sinuses: No frontal/maxillary tenderness ENT/Mouth:  EAC's clear, TM's nl w/o erythema, bulging. Nares clear w/o erythema, swelling, exudates. Oropharynx clear without erythema or exudates. Oral hygiene is good. Tongue normal, non obstructing. Hearing intact.  Neck: Supple. Thyroid  not palpable. Car 2+/2+ without bruits, nodes or JVD. Chest: Respirations nl with BS clear & equal w/few scattered  rales,  but no rhonchi, wheezing or stridor.  Cor: Heart sounds normal w/ regular rate and rhythm without sig. murmurs, gallops, clicks or rubs. Peripheral pulses normal and equal  without edema.  Abdomen: Soft & bowel sounds  normal. Non-tender w/o guarding, rebound, hernias, masses or organomegaly.  Lymphatics: Unremarkable.  Musculoskeletal: Full ROM all peripheral extremities, joint stability, 5/5 strength and normal gait.  Skin: Warm, dry without exposed rashes, lesions or ecchymosis apparent.  Neuro: Cranial nerves intact, reflexes equal bilaterally. Sensory-motor testing grossly intact. Tendon reflexes grossly intact.  Pysch: Alert & oriented x 3.  Insight and judgement nl & appropriate. No ideations.  Assessment and Plan:  1. Elevated BP without diagnosis of hypertension (Primary)   - Continue medication, monitor blood pressure at home.  - Continue DASH diet.  Reminder to go to the ER if any CP,  SOB, nausea, dizziness, severe HA, changes vision/speech.   - CBC with Differential/Platelet - COMPLETE METABOLIC PANEL WITH GFR - Magnesium - TSH   2. Hyperlipidemia, mixed   - Continue diet/meds, exercise,& lifestyle modifications.  - Continue monitor periodic cholesterol/liver & renal functions      - Lipid panel - TSH   3. Abnormal glucose   - Continue diet, exercise  - Lifestyle modifications.  - Monitor appropriate labs   - COMPLETE METABOLIC PANEL WITH GFR - Hemoglobin A1c   4. Vitamin D  deficiency    - Continue supplementation   - VITAMIN D  25 Hydroxy   5. Aortic atherosclerosis (HCC)  - Lipid panel   6. Moderate vascular dementia (HCC)  - Lipid panel   7. B12 deficiency  - Vitamin B12   8. Medication management  - CBC with Differential/Platelet - COMPLETE METABOLIC PANEL WITH GFR - Magnesium - Lipid panel - TSH - Hemoglobin A1c - Insulin , random - VITAMIN D  25 Hydroxy - Vitamin B12        Discussed  regular exercise, BP monitoring, weight control to achieve/maintain BMI less than 25 and discussed med and SE's. Recommended labs to assess and monitor clinical status with further disposition pending results of labs.  I discussed the assessment and treatment  plan with the patient & daughter.  They were  provided an opportunity to ask questions and all were answered. They agreed with the plan and demonstrated an understanding of the instructions.  I provided over 30 minutes of exam, counseling, chart review and  complex critical decision making.      They were  advised to call back or seek an in-person evaluation if the symptoms worsen or if the condition fails to improve as anticipated.    Elsie JONETTA Richards, MD

## 2024-01-10 NOTE — Patient Instructions (Addendum)
 Marland Kitchen

## 2024-01-11 ENCOUNTER — Encounter: Payer: Self-pay | Admitting: Internal Medicine

## 2024-01-12 ENCOUNTER — Other Ambulatory Visit (HOSPITAL_BASED_OUTPATIENT_CLINIC_OR_DEPARTMENT_OTHER): Payer: Medicare Other

## 2024-01-12 NOTE — Telephone Encounter (Signed)
It makes sense to delay the visit until after the echo. I am out of office the second week of February. If possible, schedule on Tuesday 2/18 DOD day - otherwise add on to end of schedule at 10AM on Friday 2/21, please

## 2024-01-16 ENCOUNTER — Ambulatory Visit: Payer: Medicare Other | Admitting: Cardiovascular Disease

## 2024-01-17 ENCOUNTER — Ambulatory Visit: Payer: Medicare Other | Admitting: Internal Medicine

## 2024-02-02 ENCOUNTER — Encounter: Payer: Self-pay | Admitting: Internal Medicine

## 2024-02-02 ENCOUNTER — Ambulatory Visit (HOSPITAL_BASED_OUTPATIENT_CLINIC_OR_DEPARTMENT_OTHER): Payer: Medicare Other

## 2024-02-02 DIAGNOSIS — I959 Hypotension, unspecified: Secondary | ICD-10-CM

## 2024-02-02 LAB — ECHOCARDIOGRAM COMPLETE
AV Mean grad: 11.7 mm[Hg]
AV Peak grad: 20.8 mm[Hg]
Ao pk vel: 2.28 m/s
Area-P 1/2: 3.48 cm2
MV M vel: 3.69 m/s
MV Peak grad: 54.5 mm[Hg]
S' Lateral: 3.18 cm

## 2024-02-03 ENCOUNTER — Encounter: Payer: Self-pay | Admitting: Cardiovascular Disease

## 2024-02-08 ENCOUNTER — Encounter: Payer: Self-pay | Admitting: Cardiovascular Disease

## 2024-02-08 ENCOUNTER — Other Ambulatory Visit: Payer: Self-pay

## 2024-02-08 ENCOUNTER — Telehealth: Payer: Self-pay

## 2024-02-08 DIAGNOSIS — F331 Major depressive disorder, recurrent, moderate: Secondary | ICD-10-CM

## 2024-02-08 MED ORDER — BUPROPION HCL ER (XL) 150 MG PO TB24: 150.0000 mg | ORAL_TABLET | Freq: Every day | ORAL | 0 refills | Status: AC

## 2024-02-08 NOTE — Telephone Encounter (Signed)
Left VMM for daughter to call back regarding Blood pressure reading today for patient.

## 2024-02-14 ENCOUNTER — Encounter: Payer: Self-pay | Admitting: Cardiovascular Disease

## 2024-02-14 ENCOUNTER — Ambulatory Visit: Payer: Medicare Other | Attending: Cardiovascular Disease | Admitting: Cardiovascular Disease

## 2024-02-14 VITALS — BP 108/80 | HR 82 | Ht 64.0 in | Wt 121.4 lb

## 2024-02-14 DIAGNOSIS — Z952 Presence of prosthetic heart valve: Secondary | ICD-10-CM

## 2024-02-14 DIAGNOSIS — I959 Hypotension, unspecified: Secondary | ICD-10-CM | POA: Diagnosis not present

## 2024-02-14 DIAGNOSIS — I700 Atherosclerosis of aorta: Secondary | ICD-10-CM

## 2024-02-14 DIAGNOSIS — E782 Mixed hyperlipidemia: Secondary | ICD-10-CM

## 2024-02-14 NOTE — Progress Notes (Unsigned)
Cardiology office note   Date:  02/15/2024   ID:  [name], [name] August 03, 1941, MRN [id number]  Patient Location: Home Provider Location: Office  PCP:  Connie Poling, DO  Cardiologist:  Connie Alvarado Electrophysiologist:  None   Evaluation Performed: Follow-up  Chief Complaint:  syncope  History of Present Illness:    Connie Alvarado is a 83 y.o. 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  Connie Alvarado resides in independent living at [facility].  She seems quite content.  She requires a lot of prompting and visual cues to have her take her medications properly, due to worsening memory issues.  She has not had any Connie falls.  Since starting midodrine her blood pressure has been much better, mostly in the 100s/70s.  On multiple checks of her blood pressure, only once as her systolic blood pressure being a little high at 141 mmHg.  For the most part she is only taking the midodrine in the morning and in the early afternoon.  She has assigned on her nightstand to remind her to take the midodrine first thing in the morning.  She remains very slender with a BMI that is just under 21.  Echocardiogram performed 02/02/2024 is compared with the ones from June 2020 and May 2021 and May 2023, which have allshown virtually no change in aortic valve parameters, when compared with the most recent echocardiogram from July 2019 from [facility].  Her daughter Connie Alvarado is also my patient.  She accompanies [name] to her office appointments.   Past Medical History:  Diagnosis Date   Alcohol abuse    Anxiety    Depression    Hyperlipidemia    Lymphocytic colitis    Malaria    as a child   Memory loss    Past Surgical History:  Procedure Laterality Date   CARDIAC VALVE SURGERY  1999   Aortic valve   CARDIAC VALVE SURGERY  2016    Aortic valve     Current Meds  Medication Sig   atorvastatin (LIPITOR) 80 MG tablet Take  1 tablet  Daily for Cholesterol                                                                                                         /                                                TAKE                                       BY  MOUTH   buPROPion (WELLBUTRIN XL) 150 MG 24 hr tablet Take 1 tablet (150 mg total) by mouth daily.   Cholecalciferol (VITAMIN D) 125 MCG (5000 UT) CAPS Take 1 tablet by mouth daily.   midodrine (PROAMATINE) 5 MG tablet Take 1 tablet (5 mg total) by mouth 3 (three) times daily with meals.   Multiple Vitamin (MULTIVITAMIN ADULT PO) Take 3,000 Units by mouth. Life Extensions     Allergies:   Tizanidine   Social History   Tobacco Use   Smoking status: Former    Current packs/day: 0.00    Types: Cigarettes    Quit date: 1980    Years since quitting: 45.1   Smokeless tobacco: Never  Vaping Use   Vaping status: Never Used  Substance Use Topics   Alcohol use: Yes    Alcohol/week: 28.0 standard drinks of alcohol    Types: 28 Standard drinks or equivalent per week    Comment: 2 glasses nightly   Drug use: Never     Family Hx: The patient's family history includes Breast cancer in her mother; Hyperlipidemia in her mother; Hypertension in her mother; Kidney disease in her father; Parkinson's disease in her father; Parkinsonism in her father; Sleep apnea in her daughter. There is no history of Colon cancer, Esophageal cancer, Rectal cancer, or Stomach cancer.  ROS:   Please see the history of present illness.     All other systems reviewed and are negative.   Prior CV studies:   The following studies were reviewed today:  Echo report from [facility] 07/03/2018 peak aortic prosthesis velocity 3.14 m/s, mean gradient 20 mmHg  ECHO 05/26/2022   1. Left ventricular ejection fraction, by estimation, is 60 to 65%. The  left  ventricle has normal function. The left ventricle has no regional  wall motion abnormalities. There is moderate left ventricular hypertrophy.  Left ventricular diastolic  parameters are consistent with Grade I diastolic dysfunction (impaired  relaxation).   2. Right ventricular systolic function is normal. The right ventricular  size is mildly enlarged. There is normal pulmonary artery systolic  pressure. The estimated right ventricular systolic pressure is 24.3 mmHg.   3. The mitral valve is normal in structure. Trivial mitral valve  regurgitation. No evidence of mitral stenosis.   4. There is a 23 mm Edwards Sapien prosthetic, stented (TAVR) valve  present in the aortic position.      Aortic valve regurgitation is not visualized. Vmax 2.5 m/s, EOA 1.2  cm^2, DI 0.31. MG , improved from prior echo ( on 04/29/21)   5. The inferior vena cava is normal in size with greater than 50%  respiratory variability, suggesting right atrial pressure of 3 mmHg.   Labs/Other Tests and Data Reviewed:    EKG: Reviewed ECG tracings from EMS from 12/11/2023 which shows sinus rhythm with PVCs in regular pattern, every 5 beats" LVH by voltage criteria only.  No change from the ECG performed 08/23/2023.  EKG Interpretation Date/Time:    Ventricular Rate:    PR Interval:    QRS Duration:    QT Interval:    QTC Calculation:   R Axis:      Text Interpretation:           Recent Labs: 09/23/2023: ALT 16; BUN 14; Creat 0.94; Hemoglobin 11.8; Magnesium 2.1; Platelets 177; Potassium 4.6; Sodium 139; TSH 1.98   Recent Lipid Panel Lab Results  Component Value Date/Time   CHOL 189 09/23/2023 11:00 AM   TRIG 97 09/23/2023  11:00 AM   HDL 75 09/23/2023 11:00 AM   CHOLHDL 2.5 09/23/2023 11:00 AM   LDLCALC 95 09/23/2023 11:00 AM    Wt Readings from Last 3 Encounters:  02/14/24 121 lb 6.4 oz (55.1 kg)  12/16/23 121 lb (54.9 kg)  09/23/23 120 lb 12.8 oz (54.8 kg)     Objective:    Vital  Signs:  BP 108/80 (BP Location: Left Arm, Patient Position: Sitting, Cuff Size: Normal)   Pulse 82   Ht 5\' 4"  (1.626 m)   Wt 121 lb 6.4 oz (55.1 kg)   SpO2 98%   BMI 20.84 kg/m     General: Alert, oriented x3, no distress, appears quite thin and frail, but looks very comfortable and smiles. Head: no evidence of trauma, PERRL, EOMI, no exophtalmos or lid lag, no myxedema, no xanthelasma; normal ears, nose and oropharynx Neck: normal jugular venous pulsations and no hepatojugular reflux; brisk carotid pulses without delay and no carotid bruits Chest: clear to auscultation, no signs of consolidation by percussion or palpation, normal fremitus, symmetrical and full respiratory excursions Cardiovascular: normal position and quality of the apical impulse, regular rhythm, normal first and second heart sounds, barely audible aortic ejection murmur, no diastolic murmurs, rubs or gallops Abdomen: no tenderness or distention, no masses by palpation, no abnormal pulsatility or arterial bruits, normal bowel sounds, no hepatosplenomegaly Extremities: no clubbing, cyanosis or edema; 2+ radial, ulnar and brachial pulses bilaterally; 2+ right femoral, posterior tibial and dorsalis pedis pulses; 2+ left femoral, posterior tibial and dorsalis pedis pulses; no subclavian or femoral bruits Neurological: grossly nonfocal, with obvious problems with short-term memory. Psych: Normal mood and affect     1. Hypotension, unspecified hypotension type   2. H/O prosthetic aortic valve replacement   3. Aortic atherosclerosis (HCC) - CT 05/2020   4. Mixed hyperlipidemia      ASSESSMENT & PLAN:    Hypotension: Improved with midodrine.  Reminded her that she has to stay well-hydrated.  She can eat a relatively salty diet.  She had significant resting hypotension as well as orthostatic worsening of hypotension.  Reminded her that she should take the midodrine 2.5 mg  first thing in the morning, even before getting out of  bed and then again 4-5 hours later probably just after lunchtime.  She should never take the midodrine less than 4 hours before bedtime.  Check blood pressure at home 2 hours after the medications administered.  Reassess in clinic in about a month.  She has not had problems with hyponatremia or hypokalemia and does not have other features that raise concern for adrenal insufficiency but we may need to explore this in the future. AS s/p (redo) AVR: Chronic mild elevation in aortic prosthetic gradients has been very stable (she has a TAVR valve in valve and probably has some degree of valve mismatch).  There is no evidence of progressive prosthetic decline. She has a standing prescription for amoxicillin before dental procedures and understands how to take SBE prophylaxis. Hyperlipidemia: She does not have known CAD or PAD, although there is mention of aortic atherosclerosis on CT imaging.  Her most recent LDL cholesterol 95 is acceptable.  (Target less than 100). Cognitive deficits: Slowly progressing.  Her daughter Connie Alvarado was here with her today.  Some concern about her ability to comply with medications, but she has numerous visual cues to help her remember.  Patient Instructions  Medication Instructions:  Your physician recommends that you continue on your current medications as directed.  Please refer to the Current Medication list given to you today.    *If you need a refill on your cardiac medications before your next appointment, please call your pharmacy*   Lab Work: NONE    If you have labs (blood work) drawn today and your tests are completely normal, you will receive your results only by: MyChart Message (if you have MyChart) OR A paper copy in the mail If you have any lab test that is abnormal or we need to change your treatment, we will call you to review the results.   Testing/Procedures: NONE    Follow-Up: At [facility], you and your health needs are our priority.  As  part of our continuing mission to provide you with exceptional heart care, we have created designated Provider Care Teams.  These Care Teams include your primary Cardiologist (physician) and Advanced Practice Providers (APPs -  Physician Assistants and Nurse Practitioners) who all work together to provide you with the care you need, when you need it.  We recommend signing up for the patient portal called "MyChart".  Sign up information is provided on this After Visit Summary.  MyChart is used to connect with patients for Virtual Visits (Telemedicine).  Patients are able to view lab/test results, encounter notes, upcoming appointments, etc.  Non-urgent messages can be sent to your provider as well.   To learn more about what you can do with MyChart, go to [email or web address].    Your next appointment:   1 year(s)  The format for your next appointment:   In Person  Provider:   Thurmon Fair, MD    Other Instructions    Signed, Thurmon Fair, MD  02/15/2024 4:30 PM    [facility]

## 2024-02-14 NOTE — Patient Instructions (Signed)
Medication Instructions:   Your physician recommends that you continue on your current medications as directed. Please refer to the Current Medication list given to you today.   *If you need a refill on your cardiac medications before your next appointment, please call your pharmacy*   Lab Work: NONE    If you have labs (blood work) drawn today and your tests are completely normal, you will receive your results only by: MyChart Message (if you have MyChart) OR A paper copy in the mail If you have any lab test that is abnormal or we need to change your treatment, we will call you to review the results.   Testing/Procedures: NONE    Follow-Up: At [facility], you and your health needs are our priority.  As part of our continuing mission to provide you with exceptional heart care, we have created designated Provider Care Teams.  These Care Teams include your primary Cardiologist (physician) and Advanced Practice Providers (APPs -  Physician Assistants and Nurse Practitioners) who all work together to provide you with the care you need, when you need it.  We recommend signing up for the patient portal called "MyChart".  Sign up information is provided on this After Visit Summary.  MyChart is used to connect with patients for Virtual Visits (Telemedicine).  Patients are able to view lab/test results, encounter notes, upcoming appointments, etc.  Non-urgent messages can be sent to your provider as well.   To learn more about what you can do with MyChart, go to [email or web address].    Your next appointment:   1 year(s)  The format for your next appointment:   In Person  Provider:   Thurmon Fair, MD    Other Instructions

## 2024-02-20 ENCOUNTER — Other Ambulatory Visit: Payer: Self-pay | Admitting: *Deleted

## 2024-02-20 ENCOUNTER — Encounter: Payer: Self-pay | Admitting: Cardiovascular Disease

## 2024-02-20 DIAGNOSIS — E782 Mixed hyperlipidemia: Secondary | ICD-10-CM

## 2024-02-20 MED ORDER — ATORVASTATIN CALCIUM 80 MG PO TABS: ORAL_TABLET | ORAL | 3 refills | Status: DC

## 2024-03-01 DIAGNOSIS — F03B0 Unspecified dementia, moderate, without behavioral disturbance, psychotic disturbance, mood disturbance, and anxiety: Secondary | ICD-10-CM | POA: Diagnosis not present

## 2024-03-01 DIAGNOSIS — I959 Hypotension, unspecified: Secondary | ICD-10-CM | POA: Diagnosis not present

## 2024-03-01 DIAGNOSIS — Z952 Presence of prosthetic heart valve: Secondary | ICD-10-CM | POA: Diagnosis not present

## 2024-03-01 DIAGNOSIS — E785 Hyperlipidemia, unspecified: Secondary | ICD-10-CM | POA: Diagnosis not present

## 2024-03-01 DIAGNOSIS — F32A Depression, unspecified: Secondary | ICD-10-CM | POA: Diagnosis not present

## 2024-04-26 ENCOUNTER — Ambulatory Visit: Payer: Medicare Other | Admitting: Nurse Practitioner

## 2024-05-22 ENCOUNTER — Other Ambulatory Visit: Payer: Self-pay | Admitting: Family

## 2024-05-22 DIAGNOSIS — F331 Major depressive disorder, recurrent, moderate: Secondary | ICD-10-CM

## 2024-06-02 ENCOUNTER — Emergency Department (HOSPITAL_COMMUNITY)
Admission: EM | Admit: 2024-06-02 | Discharge: 2024-06-02 | Disposition: A | Discharge status: Home / Self Care | Attending: Emergency Medicine | Admitting: Emergency Medicine

## 2024-06-02 ENCOUNTER — Encounter (HOSPITAL_COMMUNITY): Payer: Self-pay

## 2024-06-02 ENCOUNTER — Other Ambulatory Visit: Payer: Self-pay

## 2024-06-02 ENCOUNTER — Emergency Department (HOSPITAL_COMMUNITY)

## 2024-06-02 DIAGNOSIS — R0789 Other chest pain: Secondary | ICD-10-CM | POA: Insufficient documentation

## 2024-06-02 DIAGNOSIS — I491 Atrial premature depolarization: Secondary | ICD-10-CM | POA: Diagnosis not present

## 2024-06-02 DIAGNOSIS — I499 Cardiac arrhythmia, unspecified: Secondary | ICD-10-CM | POA: Diagnosis not present

## 2024-06-02 DIAGNOSIS — R079 Chest pain, unspecified: Secondary | ICD-10-CM | POA: Diagnosis not present

## 2024-06-02 DIAGNOSIS — R000 Tachycardia, unspecified: Secondary | ICD-10-CM | POA: Diagnosis not present

## 2024-06-02 LAB — COMPREHENSIVE METABOLIC PANEL WITH GFR
ALT: 26 U/L (ref 0–44)
AST: 30 U/L (ref 15–41)
Albumin: 3.5 g/dL (ref 3.5–5.0)
Alkaline Phosphatase: 70 U/L (ref 38–126)
Anion gap: 12 (ref 5–15)
BUN: 15 mg/dL (ref 8–23)
CO2: 20 mmol/L — ABNORMAL LOW (ref 22–32)
Calcium: 8.9 mg/dL (ref 8.9–10.3)
Chloride: 104 mmol/L (ref 98–111)
Creatinine, Ser: 0.84 mg/dL (ref 0.44–1.00)
GFR, Estimated: >60 mL/min (ref >60)
Glucose, Bld: 93 mg/dL (ref 70–99)
Potassium: 4.1 mmol/L (ref 3.5–5.1)
Sodium: 136 mmol/L (ref 135–145)
Total Bilirubin: 0.8 mg/dL (ref 0.0–1.2)
Total Protein: 6.4 g/dL — ABNORMAL LOW (ref 6.5–8.1)

## 2024-06-02 LAB — CBC WITH DIFFERENTIAL/PLATELET
Abs Immature Granulocytes: 0.02 10*3/uL (ref 0.00–0.07)
Basophils Absolute: 0.1 10*3/uL (ref 0.0–0.1)
Basophils Relative: 1 %
Eosinophils Absolute: 0.2 10*3/uL (ref 0.0–0.5)
Eosinophils Relative: 3 %
HCT: 35.2 % — ABNORMAL LOW (ref 36.0–46.0)
Hemoglobin: 11.6 g/dL — ABNORMAL LOW (ref 12.0–15.0)
Immature Granulocytes: 0 %
Lymphocytes Relative: 15 %
Lymphs Abs: 1 10*3/uL (ref 0.7–4.0)
MCH: 30.4 pg (ref 26.0–34.0)
MCHC: 33 g/dL (ref 30.0–36.0)
MCV: 92.4 fL (ref 80.0–100.0)
Monocytes Absolute: 0.5 10*3/uL (ref 0.1–1.0)
Monocytes Relative: 7 %
Neutro Abs: 5.2 10*3/uL (ref 1.7–7.7)
Neutrophils Relative %: 74 %
Platelets: 160 10*3/uL (ref 150–400)
RBC: 3.81 MIL/uL — ABNORMAL LOW (ref 3.87–5.11)
RDW: 13.2 % (ref 11.5–15.5)
WBC: 7 10*3/uL (ref 4.0–10.5)
nRBC: 0 % (ref 0.0–0.2)

## 2024-06-02 LAB — TROPONIN I (HIGH SENSITIVITY)
Troponin I (High Sensitivity): 14 ng/L (ref <18)
Troponin I (High Sensitivity): 12 ng/L (ref <18)

## 2024-06-02 NOTE — ED Provider Notes (Signed)
 [place] EMERGENCY DEPARTMENT AT [facility] Provider Note   CSN: [id number] Arrival date & time: 06/02/24  0250     History  Chief Complaint  Patient presents with   Chest Pain    Connie Alvarado is a 83 y.o. female.  Presents to the emergency department for evaluation of chest discomfort.  Patient woke up and had a burning in the center of her chest, called EMS.  At arrival to the ED, patient reports that she is feeling well, no further discomfort.       Home Medications Prior to Admission medications   Medication Sig Start Date End Date Taking? Authorizing Provider  Ascorbic Acid (VITAMIN C PO) PRN Patient not taking: Reported on 02/14/2024    [provider]  atorvastatin  (LIPITOR) 80 MG tablet Take  1 tablet  Daily for Cholesterol                                                                                                         /                                                TAKE                                       BY                                             MOUTH 02/20/24   Croitoru, Mihai, MD  bismuth  subsalicylate (PEPTO BISMOL) 262 MG/15ML suspension Take 30 mLs by mouth every 6 (six) hours as needed. Patient not taking: Reported on 02/14/2024    [provider]  buPROPion  (WELLBUTRIN  XL) 150 MG 24 hr tablet Take 1 tablet (150 mg total) by mouth daily. 02/08/24   Webb, Padonda B, FNP  Cholecalciferol (VITAMIN D ) 125 MCG (5000 UT) CAPS Take 1 tablet by mouth daily.    [provider]  midodrine  (PROAMATINE ) 5 MG tablet Take 1 tablet (5 mg total) by mouth 3 (three) times daily with meals. 12/16/23   Croitoru, Mihai, MD  Multiple Vitamin (MULTIVITAMIN ADULT PO) Take 3,000 Units by mouth. Life Extensions    [provider]  Multiple Vitamins-Minerals (ZINC PO) Take by mouth. 2 days a week Patient not taking: Reported on 02/14/2024    [provider]  Pancrelipase , Lip-Prot-Amyl, (CREON ) 24000-76000 units CPEP One  with meals and one with snacks Patient not taking: Reported on 02/14/2024 04/27/23   Cranford, Tonya, NP      Allergies    Tizanidine     Review of Systems   Review of Systems  Physical Exam Updated Vital Signs BP (!) 147/85   Pulse 84   Temp  98.2 F (36.8 C) (Oral)   Resp (!) 22   SpO2 99%  Physical Exam Vitals and nursing note reviewed.  Constitutional:      General: She is not in acute distress.    Appearance: She is well-developed.  HENT:     Head: Normocephalic and atraumatic.     Mouth/Throat:     Mouth: Mucous membranes are moist.  Eyes:     General: Vision grossly intact. Gaze aligned appropriately.     Extraocular Movements: Extraocular movements intact.     Conjunctiva/sclera: Conjunctivae normal.  Cardiovascular:     Rate and Rhythm: Normal rate and regular rhythm.     Pulses: Normal pulses.     Heart sounds: Normal heart sounds, S1 normal and S2 normal. No murmur heard.    No friction rub. No gallop.  Pulmonary:     Effort: Pulmonary effort is normal. No respiratory distress.     Breath sounds: Normal breath sounds.  Abdominal:     General: Bowel sounds are normal.     Palpations: Abdomen is soft.     Tenderness: There is no abdominal tenderness. There is no guarding or rebound.     Hernia: No hernia is present.  Musculoskeletal:        General: No swelling.     Cervical back: Full passive range of motion without pain, normal range of motion and neck supple. No spinous process tenderness or muscular tenderness. Normal range of motion.     Right lower leg: No edema.     Left lower leg: No edema.  Skin:    General: Skin is warm and dry.     Capillary Refill: Capillary refill takes less than 2 seconds.     Findings: No ecchymosis, erythema, rash or wound.  Neurological:     General: No focal deficit present.     Mental Status: She is alert and oriented to person, place, and time.     GCS: GCS eye subscore is 4. GCS verbal subscore is 5. GCS motor subscore  is 6.     Cranial Nerves: Cranial nerves 2-12 are intact.     Sensory: Sensation is intact.     Motor: Motor function is intact.     Coordination: Coordination is intact.  Psychiatric:        Attention and Perception: Attention normal.        Mood and Affect: Mood normal.        Speech: Speech normal.        Behavior: Behavior normal.     ED Results / Procedures / Treatments   Labs (all labs ordered are listed, but only abnormal results are displayed) Labs Reviewed  CBC WITH DIFFERENTIAL/PLATELET - Abnormal; Notable for the following components:      Result Value   RBC 3.81 (*)    Hemoglobin 11.6 (*)    HCT 35.2 (*)    All other components within normal limits  COMPREHENSIVE METABOLIC PANEL WITH GFR - Abnormal; Notable for the following components:   CO2 20 (*)    Total Protein 6.4 (*)    All other components within normal limits  TROPONIN I (HIGH SENSITIVITY)  TROPONIN I (HIGH SENSITIVITY)    EKG EKG Interpretation Date/Time:  Saturday June 02 2024 03:04:23 EDT Ventricular Rate:  90 PR Interval:  200 QRS Duration:  87 QT Interval:  389 QTC Calculation: 476 R Axis:   14  Text Interpretation: Sinus tachycardia Multiple ventricular premature complexes Abnormal R-wave progression, early  transition Confirmed by Ballard Bongo [phone]) on 06/02/2024 3:06:08 AM  Radiology DG Chest Port 1 View Result Date: 06/02/2024 CLINICAL DATA:  Chest pain EXAM: PORTABLE CHEST 1 VIEW COMPARISON:  04/29/2022 FINDINGS: Cardiac shadow is stable. TAVR and postsurgical changes are again noted and stable. Lungs are well aerated bilaterally. No focal infiltrate or effusion is seen. Chronic changes in the left proximal humerus are noted. IMPRESSION: No acute abnormality noted. Electronically Signed   By: Violeta Grey M.D.   On: 06/02/2024 04:02    Procedures Procedures    Medications Ordered in ED Medications - No data to display  ED Course/ Medical Decision Making/ A&P                                  Medical Decision Making Amount and/or Complexity of Data Reviewed Labs: ordered. Radiology: ordered.   Differential Diagnosis considered includes, but not limited to: STEMI; NSTEMI; myocarditis; pericarditis; pulmonary embolism; aortic dissection; pneumothorax; pneumonia; gastritis; musculoskeletal pain   Patient presents to the emergency department for evaluation of a burning in the center of her chest that resolved prior to arrival in the ED.  EKG without ischemic changes.  Troponin negative x 2.  Patient monitored through the night, no recurrent pain, vital signs remained stable.  Symptoms very atypical for ACS.  Appropriate for discharge, outpatient follow-up.        Final Clinical Impression(s) / ED Diagnoses Final diagnoses:  Chest pain, unspecified type    Rx / DC Orders ED Discharge Orders     None         Ballard Bongo, MD 06/02/24 [phone]

## 2024-06-02 NOTE — ED Notes (Signed)
 This nurse attempted to call patient's daughter, unsuccessful.

## 2024-06-02 NOTE — ED Triage Notes (Signed)
 Patient coning from independent living [facility]. Woke up with a "burning" sensation in the middle of her chest. Pain has a resolved now. Patient has a history of dementia. EMS VS 140/70 BP 15 RR 100% RA 107 [facility] EMS 12 lead sinus rhythm with PVCs

## 2024-06-07 ENCOUNTER — Encounter (HOSPITAL_COMMUNITY): Payer: Self-pay

## 2024-06-07 ENCOUNTER — Emergency Department (HOSPITAL_COMMUNITY)

## 2024-06-07 ENCOUNTER — Emergency Department (HOSPITAL_COMMUNITY)
Admission: EM | Admit: 2024-06-07 | Discharge: 2024-06-07 | Disposition: A | Discharge status: Home / Self Care | Attending: Emergency Medicine | Admitting: Emergency Medicine

## 2024-06-07 ENCOUNTER — Telehealth: Payer: Self-pay | Admitting: Cardiovascular Disease

## 2024-06-07 ENCOUNTER — Other Ambulatory Visit: Payer: Self-pay

## 2024-06-07 DIAGNOSIS — K5732 Diverticulitis of large intestine without perforation or abscess without bleeding: Secondary | ICD-10-CM | POA: Diagnosis not present

## 2024-06-07 DIAGNOSIS — R531 Weakness: Secondary | ICD-10-CM | POA: Diagnosis not present

## 2024-06-07 DIAGNOSIS — K5792 Diverticulitis of intestine, part unspecified, without perforation or abscess without bleeding: Secondary | ICD-10-CM | POA: Insufficient documentation

## 2024-06-07 DIAGNOSIS — N200 Calculus of kidney: Secondary | ICD-10-CM | POA: Diagnosis not present

## 2024-06-07 DIAGNOSIS — K449 Diaphragmatic hernia without obstruction or gangrene: Secondary | ICD-10-CM | POA: Diagnosis not present

## 2024-06-07 DIAGNOSIS — R42 Dizziness and giddiness: Secondary | ICD-10-CM | POA: Diagnosis not present

## 2024-06-07 DIAGNOSIS — N281 Cyst of kidney, acquired: Secondary | ICD-10-CM | POA: Diagnosis not present

## 2024-06-07 DIAGNOSIS — R1084 Generalized abdominal pain: Secondary | ICD-10-CM | POA: Diagnosis not present

## 2024-06-07 DIAGNOSIS — R197 Diarrhea, unspecified: Secondary | ICD-10-CM | POA: Diagnosis not present

## 2024-06-07 DIAGNOSIS — I700 Atherosclerosis of aorta: Secondary | ICD-10-CM | POA: Diagnosis not present

## 2024-06-07 DIAGNOSIS — I959 Hypotension, unspecified: Secondary | ICD-10-CM | POA: Diagnosis not present

## 2024-06-07 LAB — COMPREHENSIVE METABOLIC PANEL WITH GFR
ALT: 20 U/L (ref 0–44)
AST: 26 U/L (ref 15–41)
Albumin: 3.4 g/dL — ABNORMAL LOW (ref 3.5–5.0)
Alkaline Phosphatase: 77 U/L (ref 38–126)
Anion gap: 9 (ref 5–15)
BUN: 18 mg/dL (ref 8–23)
CO2: 21 mmol/L — ABNORMAL LOW (ref 22–32)
Calcium: 9.2 mg/dL (ref 8.9–10.3)
Chloride: 108 mmol/L (ref 98–111)
Creatinine, Ser: 1.07 mg/dL — ABNORMAL HIGH (ref 0.44–1.00)
GFR, Estimated: 52 mL/min — ABNORMAL LOW (ref >60)
Glucose, Bld: 130 mg/dL — ABNORMAL HIGH (ref 70–99)
Potassium: 4.3 mmol/L (ref 3.5–5.1)
Sodium: 138 mmol/L (ref 135–145)
Total Bilirubin: 0.7 mg/dL (ref 0.0–1.2)
Total Protein: 6.3 g/dL — ABNORMAL LOW (ref 6.5–8.1)

## 2024-06-07 LAB — I-STAT CHEM 8, ED
BUN: 21 mg/dL (ref 8–23)
Calcium, Ion: 1.17 mmol/L (ref 1.15–1.40)
Chloride: 104 mmol/L (ref 98–111)
Creatinine, Ser: 1.2 mg/dL — ABNORMAL HIGH (ref 0.44–1.00)
Glucose, Bld: 127 mg/dL — ABNORMAL HIGH (ref 70–99)
HCT: 35 % — ABNORMAL LOW (ref 36.0–46.0)
Hemoglobin: 11.9 g/dL — ABNORMAL LOW (ref 12.0–15.0)
Potassium: 4.3 mmol/L (ref 3.5–5.1)
Sodium: 140 mmol/L (ref 135–145)
TCO2: 22 mmol/L (ref 22–32)

## 2024-06-07 LAB — CBC WITH DIFFERENTIAL/PLATELET
Abs Immature Granulocytes: 0.03 10*3/uL (ref 0.00–0.07)
Basophils Absolute: 0.1 10*3/uL (ref 0.0–0.1)
Basophils Relative: 1 %
Eosinophils Absolute: 0.2 10*3/uL (ref 0.0–0.5)
Eosinophils Relative: 2 %
HCT: 35.7 % — ABNORMAL LOW (ref 36.0–46.0)
Hemoglobin: 11.5 g/dL — ABNORMAL LOW (ref 12.0–15.0)
Immature Granulocytes: 0 %
Lymphocytes Relative: 9 %
Lymphs Abs: 0.6 10*3/uL — ABNORMAL LOW (ref 0.7–4.0)
MCH: 30.6 pg (ref 26.0–34.0)
MCHC: 32.2 g/dL (ref 30.0–36.0)
MCV: 94.9 fL (ref 80.0–100.0)
Monocytes Absolute: 0.5 10*3/uL (ref 0.1–1.0)
Monocytes Relative: 7 %
Neutro Abs: 6 10*3/uL (ref 1.7–7.7)
Neutrophils Relative %: 81 %
Platelets: 154 10*3/uL (ref 150–400)
RBC: 3.76 MIL/uL — ABNORMAL LOW (ref 3.87–5.11)
RDW: 13.2 % (ref 11.5–15.5)
WBC: 7.4 10*3/uL (ref 4.0–10.5)
nRBC: 0 % (ref 0.0–0.2)

## 2024-06-07 LAB — URINALYSIS, ROUTINE W REFLEX MICROSCOPIC
Bilirubin Urine: NEGATIVE
Glucose, UA: NEGATIVE mg/dL
Hgb urine dipstick: NEGATIVE
Ketones, ur: NEGATIVE mg/dL
Leukocytes,Ua: NEGATIVE
Nitrite: NEGATIVE
Protein, ur: NEGATIVE mg/dL
Specific Gravity, Urine: 1.03 (ref 1.005–1.030)
pH: 7 (ref 5.0–8.0)

## 2024-06-07 LAB — LIPASE, BLOOD: Lipase: 26 U/L (ref 11–51)

## 2024-06-07 MED ORDER — IOHEXOL 350 MG/ML SOLN
75.0000 mL | Freq: Once | INTRAVENOUS | Status: AC | PRN
Start: 2024-06-07 — End: 2024-06-07
  Administered 2024-06-07: 75 mL via INTRAVENOUS

## 2024-06-07 MED ORDER — SODIUM CHLORIDE 0.9 % IV BOLUS
1000.0000 mL | Freq: Once | INTRAVENOUS | Status: AC
  Administered 2024-06-07: 1000 mL via INTRAVENOUS

## 2024-06-07 MED ORDER — MIDODRINE HCL 5 MG PO TABS
2.5000 mg | ORAL_TABLET | Freq: Once | ORAL | Status: AC
  Administered 2024-06-07: 2.5 mg via ORAL
  Filled 2024-06-07: qty 1

## 2024-06-07 MED ORDER — AMOXICILLIN-POT CLAVULANATE 875-125 MG PO TABS: 1.0000 | ORAL_TABLET | Freq: Two times a day (BID) | ORAL | 0 refills | Status: DC

## 2024-06-07 NOTE — Discharge Instructions (Signed)
 Follow up with your GI doc.  Call them today if you can and discuss your visit today.   Your CT scan looked like you have diverticulitis.  I have prescribed you antibiotics.    Take your medication as prescribed.

## 2024-06-07 NOTE — ED Provider Notes (Signed)
 [place] EMERGENCY DEPARTMENT AT [facility] Provider Note   CSN: [id number] Arrival date & time: 06/07/24  1054     Patient presents with: Hypotension and Dizziness   Connie Alvarado is a 83 y.o. female.   83 yo F with a chief complaints of dizziness and hypotension.  This was noticed at her facility this morning.  Patient is demented and has trouble providing any history.  Per EMS the patient was found to be hypotensive and tachycardic and pale.  Had 1 loose stool.  She tells me her abdomen does not feel right.  She has trouble describing it further.   Dizziness      Prior to Admission medications   Medication Sig Start Date End Date Taking? Authorizing Provider  amoxicillin -clavulanate (AUGMENTIN ) 875-125 MG tablet Take 1 tablet by mouth every 12 (twelve) hours. 06/07/24  Yes Albertus Hughs, DO  Ascorbic Acid (VITAMIN C PO) PRN Patient not taking: Reported on 02/14/2024    [provider]  atorvastatin  (LIPITOR) 80 MG tablet Take  1 tablet  Daily for Cholesterol                                                                                                         /                                                TAKE                                       BY                                             MOUTH 02/20/24   Croitoru, Mihai, MD  bismuth  subsalicylate (PEPTO BISMOL) 262 MG/15ML suspension Take 30 mLs by mouth every 6 (six) hours as needed. Patient not taking: Reported on 02/14/2024    [provider]  buPROPion  (WELLBUTRIN  XL) 150 MG 24 hr tablet Take 1 tablet (150 mg total) by mouth daily. 02/08/24   Webb, Padonda B, FNP  Cholecalciferol (VITAMIN D ) 125 MCG (5000 UT) CAPS Take 1 tablet by mouth daily.    [provider]  midodrine  (PROAMATINE ) 5 MG tablet Take 1 tablet (5 mg total) by mouth 3 (three) times daily with meals. 12/16/23   Croitoru, Mihai, MD  Multiple Vitamin (MULTIVITAMIN ADULT PO) Take 3,000 Units by mouth. Life Extensions     [provider]  Multiple Vitamins-Minerals (ZINC PO) Take by mouth. 2 days a week Patient not taking: Reported on 02/14/2024    [provider]  Pancrelipase , Lip-Prot-Amyl, (CREON ) 24000-76000 units CPEP One with meals and one with snacks Patient not taking: Reported on 02/14/2024 04/27/23   Cranford,  Lynnie Saucier, NP    Allergies: Tizanidine     Review of Systems  Neurological:  Positive for dizziness.    Updated Vital Signs BP (!) 127/58   Pulse 65   Temp (!) 96.8 F (36 C) (Oral)   Resp (!) 21   Ht 5' 4 (1.626 m)   Wt 54.4 kg   SpO2 98%   BMI 20.60 kg/m   Physical Exam Vitals and nursing note reviewed.  Constitutional:      General: She is not in acute distress.    Appearance: She is well-developed. She is not diaphoretic.  HENT:     Head: Normocephalic and atraumatic.   Eyes:     Pupils: Pupils are equal, round, and reactive to light.    Cardiovascular:     Rate and Rhythm: Normal rate and regular rhythm.     Heart sounds: No murmur heard.    No friction rub. No gallop.  Pulmonary:     Effort: Pulmonary effort is normal.     Breath sounds: No wheezing or rales.  Abdominal:     General: There is no distension.     Palpations: Abdomen is soft.     Tenderness: There is no abdominal tenderness.   Musculoskeletal:        General: No tenderness.     Cervical back: Normal range of motion and neck supple.   Skin:    General: Skin is warm and dry.   Neurological:     Mental Status: She is alert and oriented to person, place, and time.   Psychiatric:        Behavior: Behavior normal.     (all labs ordered are listed, but only abnormal results are displayed) Labs Reviewed  CBC WITH DIFFERENTIAL/PLATELET - Abnormal; Notable for the following components:      Result Value   RBC 3.76 (*)    Hemoglobin 11.5 (*)    HCT 35.7 (*)    Lymphs Abs 0.6 (*)    All other components within normal limits  COMPREHENSIVE METABOLIC PANEL WITH GFR - Abnormal;  Notable for the following components:   CO2 21 (*)    Glucose, Bld 130 (*)    Creatinine, Ser 1.07 (*)    Total Protein 6.3 (*)    Albumin 3.4 (*)    GFR, Estimated 52 (*)    All other components within normal limits  URINALYSIS, ROUTINE W REFLEX MICROSCOPIC - Abnormal; Notable for the following components:   Color, Urine STRAW (*)    All other components within normal limits  I-STAT CHEM 8, ED - Abnormal; Notable for the following components:   Creatinine, Ser 1.20 (*)    Glucose, Bld 127 (*)    Hemoglobin 11.9 (*)    HCT 35.0 (*)    All other components within normal limits  LIPASE, BLOOD    EKG: EKG Interpretation Date/Time:  Thursday June 07 2024 11:03:12 EDT Ventricular Rate:  66 PR Interval:  184 QRS Duration:  86 QT Interval:  457 QTC Calculation: 479 R Axis:   0  Text Interpretation: Sinus rhythm Minimal ST depression, lateral leads Since last tracing rate slower Otherwise no significant change Confirmed by Albertus Hughs [phone]) on 06/07/2024 11:06:50 AM  Radiology: CT ABDOMEN PELVIS W CONTRAST Result Date: 06/07/2024 CLINICAL DATA:  Abdominal pain, acute, nonlocalized EXAM: CT ABDOMEN AND PELVIS WITH CONTRAST TECHNIQUE: Multidetector CT imaging of the abdomen and pelvis was performed using the standard protocol following bolus administration of intravenous contrast. RADIATION DOSE  REDUCTION: This exam was performed according to the departmental dose-optimization program which includes automated exposure control, adjustment of the mA and/or kV according to patient size and/or use of iterative reconstruction technique. CONTRAST:  75mL OMNIPAQUE IOHEXOL 350 MG/ML SOLN COMPARISON:  CT abdomen/pelvis dated 05/14/2019. FINDINGS: Lower chest: Mild bibasilar linear scarring/atelectasis. Cardiomegaly. Hepatobiliary: No focal liver abnormality is seen. No gallstones, gallbladder wall thickening, or biliary dilatation. Pancreas: Unremarkable. No pancreatic ductal dilatation or surrounding  inflammatory changes. Spleen: Normal in size without focal abnormality. Adrenals/Urinary Tract: Adrenal glands are unremarkable. Kidneys enhance symmetrically. 6 mm nonobstructive calculus in the superior pole of the left kidney. Punctate nonobstructive calculus in the inferior pole of the right kidney. No hydronephrosis. Bilateral parapelvic and renal cysts, for which no specific follow-up imaging is recommended. Bladder is unremarkable. Stomach/Bowel: Extensive sigmoid colonic diverticulosis with wall thickening and mild surrounding inflammatory stranding, compatible with mild acute diverticulitis. No evidence of perforation or abscess. Small hiatal hernia. No evidence of obstruction. Small bowel is grossly unremarkable. Vascular/Lymphatic: Aortic atherosclerosis. No enlarged abdominal or pelvic lymph nodes. Reproductive: Uterus and bilateral adnexa are unremarkable. Other: No abdominal wall hernia or abnormality. No abdominopelvic ascites. No intraperitoneal free air. Musculoskeletal: No acute osseous abnormality. No suspicious osseous lesion. Interval progression of degenerative disc disease at L5-S1. Age-indeterminate compression deformity of the L4 vertebral body demonstrating approximately 20% height loss. Age-indeterminate superior endplate compression deformity of the T12 vertebral body demonstrating approximately 20% height loss. No evidence of osseous retropulsion. These findings are new since the prior exam dated 05/14/2019. IMPRESSION: 1. Mild acute sigmoid colonic diverticulitis. No evidence of perforation or abscess. 2. Nonobstructive bilateral renal calculi, measuring up to 6 mm on the left. No hydronephrosis. 3. Age-indeterminate compression deformity of the L4 vertebral body demonstrating approximately 20% height loss. Age-indeterminate superior endplate compression deformity of the T12 vertebral body demonstrating approximately 20% height loss. No evidence of osseous retropulsion. These findings  are new since the prior exam dated 05/14/2019. 4. Aortic Atherosclerosis (ICD10-I70.0). Electronically Signed   By: Mannie Seek M.D.   On: 06/07/2024 13:01   DG Chest Port 1 View Result Date: 06/07/2024 CLINICAL DATA:  Weakness with lightheadedness and dizziness. EXAM: PORTABLE CHEST 1 VIEW COMPARISON:  Radiographs 06/02/2024 and 04/29/2022.  CT 06/10/2020. FINDINGS: 1129 hours. 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 There is aortic atherosclerosis. The lungs appear clear. No evidence of pleural effusion or pneumothorax. No acute osseous findings are identified. Stable chronic posttraumatic deformity of the proximal left humerus. IMPRESSION: No evidence of acute cardiopulmonary process. Stable postoperative changes. Electronically Signed   By: Elmon Hagedorn M.D.   On: 06/07/2024 11:38     .Critical Care  Performed by: Albertus Hughs, DO Authorized by: Albertus Hughs, DO   Critical care provider statement:    Critical care time (minutes):  35   Critical care time was exclusive of:  Separately billable procedures and treating other patients   Critical care was time spent personally by me on the following activities:  Development of treatment plan with patient or surrogate, discussions with consultants, evaluation of patient's response to treatment, examination of patient, ordering and review of laboratory studies, ordering and review of radiographic studies, ordering and performing treatments and interventions, pulse oximetry, re-evaluation of patient's condition and review of old charts   Care discussed with: admitting provider      Medications Ordered in the ED  sodium chloride  0.9 % bolus 1,000 mL (0 mLs Intravenous Stopped  06/07/24 1305)  midodrine  (PROAMATINE ) tablet 2.5 mg (2.5 mg Oral Given 06/07/24 1120)  iohexol (OMNIPAQUE) 350 MG/ML injection 75 mL (75 mLs Intravenous Contrast Given 06/07/24 1221)                                     Medical Decision Making Amount and/or Complexity of Data Reviewed Labs: ordered. Radiology: ordered.  Risk Prescription drug management.   32 yoF with a chief complaint of low blood pressure.  This was reported by EMS.  Sent here from patient's facility.  Has a history of dementia and has trouble providing any history.  My record of the patient has a history of hypertension and is chronically on midodrine .  Also documented that she has sometimes trouble remembering to take her medications.  Asked her if she had taken her medication this morning initially she told me she takes it all the time and then later when I brought it up again she said she was not sure if she took her medicines this morning.  Will give her a dose of midodrine  here.  Bolus of IV fluids.  Blood work.  She is telling you that her abdomen does not feel right.  Will obtain CT imaging with limited history.  Reassess.  CT concerning for possible diverticulitis.  Patient's daughter has arrived and provides further history.  She also thinks that maybe her daughter just forgot to take her medications this morning.  She did think that the abdominal cramping was different for her.  She does have a history of an intra-abdominal problem that required steroids in the past.  Sees GI for this but has not seen them in a while and has not required medications in some time.  Blood pressure improved significantly with her home medication.  I discussed the results with the patient and family.  Will discharge home.  Will prescribe a course of antibiotics but they will prefer to talk with her gastroenterologist prior to starting.  Will discharge       Final diagnoses:  Diverticulitis    ED Discharge Orders          Ordered    amoxicillin -clavulanate (AUGMENTIN ) 875-125 MG tablet  Every 12 hours        06/07/24 1410               Tyshae Stair, DO 06/07/24 1524

## 2024-06-07 NOTE — Telephone Encounter (Signed)
 Pt c/o medication issue:  1. Name of Medication:   midodrine  (PROAMATINE ) 5 MG tablet    2. How are you currently taking this medication (dosage and times per day)?  Twice daily   3. Are you having a reaction (difficulty breathing--STAT)?   4. What is your medication issue?   Daughter says BP dropped because patient forgot to take Midodrine  this morning. Patient went to ED for diverticulitis. They gave her Midodrine  and BP increased. Patient is back home, but daughter says she has issues with memory. Would like to know if she should start having someone administer medication(s). She would like a call back to discuss further. She mentions that she knows Dr. Alvis Ba personally and if he needs to he can call or text her directly.

## 2024-06-07 NOTE — ED Triage Notes (Signed)
 Pt BIB [facility] from [facility] d/t 45 min before EMS arrival began feeling lightheaded/dizzy, faint (no LOC) & cool/clammy. Upon EMS arrival she was also c/o gen abd pain d/t I episode diarrhea. Initial BP was 78/50, 80 bpm, 16 resp, 100% oon RA, CBG 145. BP upon arrival to ED 98/60, was A/Ox4 for EMS with some intermittent repetitive speech, Hx of dementia.

## 2024-06-07 NOTE — Telephone Encounter (Signed)
 Called patient's daughter, Barbra Ley back about her message. Patient was taken to ER by EMS after being dizzy during her exercise class. Patient has a heard time taking her pills when she is suppose to due to dementia. Patient was given midodrine  in the ER and she did fine. She would like Dr. Alvis Ba to review ER notes. Lauren's question to Dr. Alvis Ba is-  1-Should patient be on midodrine  twice a day with a higher dose?  2-If she needs to stay on the same dose three times a day, does she need someone to come make sure she is taking her medication 3 times a day?  Lauren stated she does not feel like patient needs to be seen, but it is okay for Dr. Alvis Ba to call and discuss this with her.

## 2024-06-08 NOTE — Telephone Encounter (Signed)
 I reviewed the ED notes- sounds like she is missing doses of midodrine .  Her hypotension resolved with taking midodrine .  She also was found to have likely diverticulitis and started on antibiotics, that may be contributing to her low BP.  I think she should be fine staying on the same dose of midodrine  she is on but needs help making sure she is taking her medications as prescribed

## 2024-06-08 NOTE — Telephone Encounter (Signed)
 Spoke with Barbra Ley (dpr) gave all the information from Dr Alda Amas. She reports that she never started taking the antibiotic, she wanted to have an appt with the GI or Dr C before doing anything like that- she does not tolerate many antibiotics.  She is working on getting someone to help with giving her mother medications/making sure she is taking them properly.

## 2024-06-14 DIAGNOSIS — K5792 Diverticulitis of intestine, part unspecified, without perforation or abscess without bleeding: Secondary | ICD-10-CM | POA: Diagnosis not present

## 2024-06-14 DIAGNOSIS — F03B0 Unspecified dementia, moderate, without behavioral disturbance, psychotic disturbance, mood disturbance, and anxiety: Secondary | ICD-10-CM | POA: Diagnosis not present

## 2024-06-19 DIAGNOSIS — K52832 Lymphocytic colitis: Secondary | ICD-10-CM | POA: Diagnosis not present

## 2024-06-19 DIAGNOSIS — K5732 Diverticulitis of large intestine without perforation or abscess without bleeding: Secondary | ICD-10-CM | POA: Diagnosis not present

## 2024-06-19 DIAGNOSIS — I959 Hypotension, unspecified: Secondary | ICD-10-CM | POA: Diagnosis not present

## 2024-06-19 DIAGNOSIS — E7800 Pure hypercholesterolemia, unspecified: Secondary | ICD-10-CM | POA: Diagnosis not present

## 2024-06-19 DIAGNOSIS — F03B0 Unspecified dementia, moderate, without behavioral disturbance, psychotic disturbance, mood disturbance, and anxiety: Secondary | ICD-10-CM | POA: Diagnosis not present

## 2024-06-19 DIAGNOSIS — Z87891 Personal history of nicotine dependence: Secondary | ICD-10-CM | POA: Diagnosis not present

## 2024-06-27 DIAGNOSIS — F03B0 Unspecified dementia, moderate, without behavioral disturbance, psychotic disturbance, mood disturbance, and anxiety: Secondary | ICD-10-CM | POA: Diagnosis not present

## 2024-06-27 DIAGNOSIS — Z87891 Personal history of nicotine dependence: Secondary | ICD-10-CM | POA: Diagnosis not present

## 2024-06-27 DIAGNOSIS — E7800 Pure hypercholesterolemia, unspecified: Secondary | ICD-10-CM | POA: Diagnosis not present

## 2024-06-27 DIAGNOSIS — K5732 Diverticulitis of large intestine without perforation or abscess without bleeding: Secondary | ICD-10-CM | POA: Diagnosis not present

## 2024-06-27 DIAGNOSIS — K52832 Lymphocytic colitis: Secondary | ICD-10-CM | POA: Diagnosis not present

## 2024-06-27 DIAGNOSIS — I959 Hypotension, unspecified: Secondary | ICD-10-CM | POA: Diagnosis not present

## 2024-06-28 ENCOUNTER — Ambulatory Visit: Admitting: Gastroenterology

## 2024-07-05 DIAGNOSIS — K52832 Lymphocytic colitis: Secondary | ICD-10-CM | POA: Diagnosis not present

## 2024-07-05 DIAGNOSIS — I959 Hypotension, unspecified: Secondary | ICD-10-CM | POA: Diagnosis not present

## 2024-07-05 DIAGNOSIS — F03B0 Unspecified dementia, moderate, without behavioral disturbance, psychotic disturbance, mood disturbance, and anxiety: Secondary | ICD-10-CM | POA: Diagnosis not present

## 2024-07-05 DIAGNOSIS — E7800 Pure hypercholesterolemia, unspecified: Secondary | ICD-10-CM | POA: Diagnosis not present

## 2024-07-05 DIAGNOSIS — K5732 Diverticulitis of large intestine without perforation or abscess without bleeding: Secondary | ICD-10-CM | POA: Diagnosis not present

## 2024-07-05 DIAGNOSIS — Z87891 Personal history of nicotine dependence: Secondary | ICD-10-CM | POA: Diagnosis not present

## 2024-07-07 ENCOUNTER — Other Ambulatory Visit: Payer: Self-pay | Admitting: Cardiovascular Disease

## 2024-07-11 DIAGNOSIS — I959 Hypotension, unspecified: Secondary | ICD-10-CM | POA: Diagnosis not present

## 2024-07-11 DIAGNOSIS — E7800 Pure hypercholesterolemia, unspecified: Secondary | ICD-10-CM | POA: Diagnosis not present

## 2024-07-11 DIAGNOSIS — K52832 Lymphocytic colitis: Secondary | ICD-10-CM | POA: Diagnosis not present

## 2024-07-11 DIAGNOSIS — F03B0 Unspecified dementia, moderate, without behavioral disturbance, psychotic disturbance, mood disturbance, and anxiety: Secondary | ICD-10-CM | POA: Diagnosis not present

## 2024-07-11 DIAGNOSIS — Z87891 Personal history of nicotine dependence: Secondary | ICD-10-CM | POA: Diagnosis not present

## 2024-07-11 DIAGNOSIS — K5732 Diverticulitis of large intestine without perforation or abscess without bleeding: Secondary | ICD-10-CM | POA: Diagnosis not present

## 2024-07-18 DIAGNOSIS — F03B0 Unspecified dementia, moderate, without behavioral disturbance, psychotic disturbance, mood disturbance, and anxiety: Secondary | ICD-10-CM | POA: Diagnosis not present

## 2024-07-18 DIAGNOSIS — E7800 Pure hypercholesterolemia, unspecified: Secondary | ICD-10-CM | POA: Diagnosis not present

## 2024-07-18 DIAGNOSIS — Z87891 Personal history of nicotine dependence: Secondary | ICD-10-CM | POA: Diagnosis not present

## 2024-07-18 DIAGNOSIS — I959 Hypotension, unspecified: Secondary | ICD-10-CM | POA: Diagnosis not present

## 2024-07-18 DIAGNOSIS — K5732 Diverticulitis of large intestine without perforation or abscess without bleeding: Secondary | ICD-10-CM | POA: Diagnosis not present

## 2024-07-18 DIAGNOSIS — K52832 Lymphocytic colitis: Secondary | ICD-10-CM | POA: Diagnosis not present

## 2024-07-19 DIAGNOSIS — K52832 Lymphocytic colitis: Secondary | ICD-10-CM | POA: Diagnosis not present

## 2024-07-19 DIAGNOSIS — K5732 Diverticulitis of large intestine without perforation or abscess without bleeding: Secondary | ICD-10-CM | POA: Diagnosis not present

## 2024-07-19 DIAGNOSIS — I959 Hypotension, unspecified: Secondary | ICD-10-CM | POA: Diagnosis not present

## 2024-07-19 DIAGNOSIS — Z87891 Personal history of nicotine dependence: Secondary | ICD-10-CM | POA: Diagnosis not present

## 2024-07-19 DIAGNOSIS — F03B0 Unspecified dementia, moderate, without behavioral disturbance, psychotic disturbance, mood disturbance, and anxiety: Secondary | ICD-10-CM | POA: Diagnosis not present

## 2024-07-19 DIAGNOSIS — E7800 Pure hypercholesterolemia, unspecified: Secondary | ICD-10-CM | POA: Diagnosis not present

## 2024-07-25 DIAGNOSIS — K5732 Diverticulitis of large intestine without perforation or abscess without bleeding: Secondary | ICD-10-CM | POA: Diagnosis not present

## 2024-07-25 DIAGNOSIS — F03B0 Unspecified dementia, moderate, without behavioral disturbance, psychotic disturbance, mood disturbance, and anxiety: Secondary | ICD-10-CM | POA: Diagnosis not present

## 2024-07-25 DIAGNOSIS — E7800 Pure hypercholesterolemia, unspecified: Secondary | ICD-10-CM | POA: Diagnosis not present

## 2024-07-25 DIAGNOSIS — I959 Hypotension, unspecified: Secondary | ICD-10-CM | POA: Diagnosis not present

## 2024-07-25 DIAGNOSIS — K52832 Lymphocytic colitis: Secondary | ICD-10-CM | POA: Diagnosis not present

## 2024-07-25 DIAGNOSIS — Z87891 Personal history of nicotine dependence: Secondary | ICD-10-CM | POA: Diagnosis not present

## 2024-08-01 DIAGNOSIS — Z87891 Personal history of nicotine dependence: Secondary | ICD-10-CM | POA: Diagnosis not present

## 2024-08-01 DIAGNOSIS — I959 Hypotension, unspecified: Secondary | ICD-10-CM | POA: Diagnosis not present

## 2024-08-01 DIAGNOSIS — K52832 Lymphocytic colitis: Secondary | ICD-10-CM | POA: Diagnosis not present

## 2024-08-01 DIAGNOSIS — F03B0 Unspecified dementia, moderate, without behavioral disturbance, psychotic disturbance, mood disturbance, and anxiety: Secondary | ICD-10-CM | POA: Diagnosis not present

## 2024-08-01 DIAGNOSIS — E7800 Pure hypercholesterolemia, unspecified: Secondary | ICD-10-CM | POA: Diagnosis not present

## 2024-08-01 DIAGNOSIS — K5732 Diverticulitis of large intestine without perforation or abscess without bleeding: Secondary | ICD-10-CM | POA: Diagnosis not present

## 2024-08-08 DIAGNOSIS — E7800 Pure hypercholesterolemia, unspecified: Secondary | ICD-10-CM | POA: Diagnosis not present

## 2024-08-08 DIAGNOSIS — K5732 Diverticulitis of large intestine without perforation or abscess without bleeding: Secondary | ICD-10-CM | POA: Diagnosis not present

## 2024-08-08 DIAGNOSIS — Z87891 Personal history of nicotine dependence: Secondary | ICD-10-CM | POA: Diagnosis not present

## 2024-08-08 DIAGNOSIS — K52832 Lymphocytic colitis: Secondary | ICD-10-CM | POA: Diagnosis not present

## 2024-08-08 DIAGNOSIS — I959 Hypotension, unspecified: Secondary | ICD-10-CM | POA: Diagnosis not present

## 2024-08-08 DIAGNOSIS — F03B0 Unspecified dementia, moderate, without behavioral disturbance, psychotic disturbance, mood disturbance, and anxiety: Secondary | ICD-10-CM | POA: Diagnosis not present

## 2024-09-14 ENCOUNTER — Emergency Department (HOSPITAL_COMMUNITY)
Admission: EM | Admit: 2024-09-14 | Discharge: 2024-09-15 | Disposition: A | Discharge status: Home / Self Care | Attending: Emergency Medicine | Admitting: Emergency Medicine

## 2024-09-14 ENCOUNTER — Other Ambulatory Visit: Payer: Self-pay

## 2024-09-14 ENCOUNTER — Encounter (HOSPITAL_COMMUNITY): Payer: Self-pay | Admitting: Emergency Medicine

## 2024-09-14 DIAGNOSIS — S51851A Open bite of right forearm, initial encounter: Secondary | ICD-10-CM | POA: Diagnosis not present

## 2024-09-14 DIAGNOSIS — Z79899 Other long term (current) drug therapy: Secondary | ICD-10-CM | POA: Diagnosis not present

## 2024-09-14 DIAGNOSIS — F0390 Unspecified dementia without behavioral disturbance: Secondary | ICD-10-CM | POA: Insufficient documentation

## 2024-09-14 DIAGNOSIS — W5501XA Bitten by cat, initial encounter: Secondary | ICD-10-CM | POA: Diagnosis not present

## 2024-09-14 DIAGNOSIS — R58 Hemorrhage, not elsewhere classified: Secondary | ICD-10-CM | POA: Diagnosis not present

## 2024-09-14 NOTE — ED Triage Notes (Addendum)
 Pt arrives w/ [facility] w/ c/o cat bite from her own cat to rt forearm. [facility]. Hx dementia

## 2024-09-15 DIAGNOSIS — S51851A Open bite of right forearm, initial encounter: Secondary | ICD-10-CM | POA: Diagnosis not present

## 2024-09-15 MED ORDER — AMOXICILLIN-POT CLAVULANATE 875-125 MG PO TABS
1.0000 | ORAL_TABLET | Freq: Two times a day (BID) | ORAL | 0 refills | Status: AC
Start: 2024-09-15 — End: 2024-09-20

## 2024-09-15 MED ORDER — BACITRACIN ZINC 500 UNIT/GM EX OINT
TOPICAL_OINTMENT | Freq: Two times a day (BID) | CUTANEOUS | Status: DC
  Filled 2024-09-15: qty 0.9

## 2024-09-15 MED ORDER — AMOXICILLIN-POT CLAVULANATE 875-125 MG PO TABS
1.0000 | ORAL_TABLET | Freq: Once | ORAL | Status: AC
Start: 2024-09-15 — End: 2024-09-15
  Administered 2024-09-15: 1 via ORAL
  Filled 2024-09-15: qty 1

## 2024-09-15 NOTE — ED Notes (Signed)
 Pt ambulated to restroom.

## 2024-09-15 NOTE — ED Provider Notes (Signed)
 [place] EMERGENCY DEPARTMENT AT [facility] Provider Note   CSN: [id number] Arrival date & time: 09/14/24  2314     Patient presents with: Animal Bite   Connie Alvarado is a 83 y.o. female who presents with a dog bedside with concern for cat bite to her right forearm.  This is a patient's own cat, patient with advanced dementia, lives in [facility].  Cat is up-to-date on all rabies and all her immunizations and patient is up-to-date on tetanus. Accompanied by her daughter at the bedside who provides most of the history given patient's underlying dementia.  Level 5 caveat due to patient's dementia.    HPI     Prior to Admission medications   Medication Sig Start Date End Date Taking? Authorizing Provider  amoxicillin -clavulanate (AUGMENTIN ) 875-125 MG tablet Take 1 tablet by mouth every 12 (twelve) hours. 06/07/24   Emil Share, DO  Ascorbic Acid (VITAMIN C PO) PRN Patient not taking: Reported on 02/14/2024    [provider]  atorvastatin  (LIPITOR) 80 MG tablet Take  1 tablet  Daily for Cholesterol                                                                                                         /                                                TAKE                                       BY                                             MOUTH 02/20/24   Croitoru, Mihai, MD  bismuth  subsalicylate (PEPTO BISMOL) 262 MG/15ML suspension Take 30 mLs by mouth every 6 (six) hours as needed. Patient not taking: Reported on 02/14/2024    [provider]  buPROPion  (WELLBUTRIN  XL) 150 MG 24 hr tablet Take 1 tablet (150 mg total) by mouth daily. 02/08/24   Webb, Padonda B, FNP  Cholecalciferol (VITAMIN D ) 125 MCG (5000 UT) CAPS Take 1 tablet by mouth daily.    [provider]  midodrine  (PROAMATINE ) 5 MG tablet TAKE 1 TABLET(5 MG) BY MOUTH THREE TIMES DAILY WITH MEALS 07/10/24   Croitoru, Mihai, MD  Multiple Vitamin (MULTIVITAMIN ADULT PO)  Take 3,000 Units by mouth. Life Extensions    [provider]  Multiple Vitamins-Minerals (ZINC  PO) Take by mouth. 2 days a week Patient not taking: Reported on 02/14/2024    [provider]  Pancrelipase , Lip-Prot-Amyl, (CREON ) 24000-76000 units CPEP One with meals and one with snacks Patient not taking: Reported on 02/14/2024 04/27/23  Laurice President, NP    Allergies: Tizanidine     Review of Systems  Skin:  Positive for wound.    Updated Vital Signs BP (!) 147/83 (BP Location: Left Arm)   Pulse 78   Temp 97.9 F (36.6 C) (Oral)   Resp 17   SpO2 99%   Physical Exam Vitals and nursing note reviewed.  Constitutional:      Appearance: She is not toxic-appearing.  HENT:     Head: Normocephalic and atraumatic.  Eyes:     General: No scleral icterus.       Right eye: No discharge.        Left eye: No discharge.     Conjunctiva/sclera: Conjunctivae normal.  Pulmonary:     Effort: Pulmonary effort is normal.  Skin:    General: Skin is warm and dry.      Neurological:     Mental Status: She is alert. Mental status is at baseline.  Psychiatric:        Mood and Affect: Mood normal.     (all labs ordered are listed, but only abnormal results are displayed) Labs Reviewed - No data to display  EKG: None  Radiology: No results found.   Procedures   Medications Ordered in the ED  amoxicillin -clavulanate (AUGMENTIN ) 875-125 MG per tablet 1 tablet (has no administration in time range)                                    Medical Decision Making 2 year old with cat bite.  Hypertensive on intake vital signs normal.  Puncture wounds from cat bite as above.  No evidence of cellulitis.   Risk OTC drugs. Prescription drug management.    Wound irrigated by this provider and dressed. First dose of antibiotics with Augmentin  initiated in the emergency department.  Will initiate Augmentin  in the outpatient setting and recommend close outpatient  follow-up.  Clinical concern for more generally condition at this time that warrant further ED workup and patient management is exceedingly low.  [name]'s daughter voiced understanding of her medical evaluation and treatment plan. Each of their questions answered to their expressed satisfaction.  Return precautions were given.  Patient is well-appearing, stable, and was discharged in good condition.  This chart was dictated using voice recognition software, Dragon. Despite the best efforts of this provider to proofread and correct errors, errors may still occur which can change documentation meaning.      Final diagnoses:  None    ED Discharge Orders     None          [name] 09/15/24 0334    Griselda Norris, MD 09/15/24 2244

## 2024-09-15 NOTE — Discharge Instructions (Signed)
 Connie Alvarado was seen in the ER today for cat bite.  Fortunately it did not require any repair.  Keep the area clean and dry.  Complete her antibiotics for the 5-day course and follow-up with your primary care doctor.  Return to the ER with any severe symptoms.

## 2024-09-20 DIAGNOSIS — Z23 Encounter for immunization: Secondary | ICD-10-CM | POA: Diagnosis not present

## 2024-09-24 ENCOUNTER — Encounter: Payer: Medicare Other | Admitting: Nurse Practitioner

## 2024-10-01 ENCOUNTER — Encounter: Payer: Medicare Other | Admitting: Nurse Practitioner

## 2024-10-03 DIAGNOSIS — Z23 Encounter for immunization: Secondary | ICD-10-CM | POA: Diagnosis not present

## 2024-10-03 DIAGNOSIS — W5501XA Bitten by cat, initial encounter: Secondary | ICD-10-CM | POA: Diagnosis not present

## 2024-10-03 DIAGNOSIS — E785 Hyperlipidemia, unspecified: Secondary | ICD-10-CM | POA: Diagnosis not present

## 2024-10-03 DIAGNOSIS — Z0000 Encounter for general adult medical examination without abnormal findings: Secondary | ICD-10-CM | POA: Diagnosis not present

## 2024-10-03 DIAGNOSIS — I959 Hypotension, unspecified: Secondary | ICD-10-CM | POA: Diagnosis not present

## 2024-10-03 DIAGNOSIS — F32A Depression, unspecified: Secondary | ICD-10-CM | POA: Diagnosis not present

## 2024-10-03 DIAGNOSIS — S60872A Other superficial bite of left wrist, initial encounter: Secondary | ICD-10-CM | POA: Diagnosis not present

## 2024-12-03 ENCOUNTER — Encounter: Payer: Self-pay | Admitting: Cardiovascular Disease

## 2024-12-03 MED ORDER — AMOXICILLIN 500 MG PO TABS: 2000.0000 mg | ORAL_TABLET | Freq: Once | ORAL | 0 refills | Status: DC

## 2024-12-11 MED ORDER — AMOXICILLIN 500 MG PO TABS: ORAL_TABLET | ORAL | 3 refills | Status: AC

## 2024-12-11 NOTE — Addendum Note (Signed)
 Addended by: GLADIS REENA GAILS on: 12/11/2024 03:27 PM   Modules accepted: Orders

## 2024-12-25 ENCOUNTER — Ambulatory Visit: Payer: Medicare Other | Admitting: Internal Medicine

## 2025-01-03 ENCOUNTER — Ambulatory Visit: Payer: Medicare Other | Admitting: Internal Medicine

## 2025-01-14 ENCOUNTER — Other Ambulatory Visit: Payer: Self-pay | Admitting: Cardiovascular Disease

## 2025-01-14 DIAGNOSIS — E782 Mixed hyperlipidemia: Secondary | ICD-10-CM

## 2025-01-16 ENCOUNTER — Other Ambulatory Visit: Payer: Self-pay | Admitting: Cardiovascular Disease

## 2025-01-16 DIAGNOSIS — E782 Mixed hyperlipidemia: Secondary | ICD-10-CM

## 2025-01-21 MED ORDER — ATORVASTATIN CALCIUM 80 MG PO TABS: ORAL_TABLET | ORAL | 0 refills | Status: AC

## 2025-01-21 NOTE — Telephone Encounter (Signed)
 In accordance with refill protocols, please review and address the following requirements before this medication refill can be authorized:  Labs
